# Patient Record
Sex: Female | Born: 1975
Health system: Southern US, Community
[De-identification: ages and names within clinical notes are randomized; demographics above are authoritative.]

## PROBLEM LIST (undated history)

## (undated) DIAGNOSIS — J189 Pneumonia, unspecified organism: Secondary | ICD-10-CM

## (undated) DIAGNOSIS — K219 Gastro-esophageal reflux disease without esophagitis: Secondary | ICD-10-CM

## (undated) DIAGNOSIS — M1990 Unspecified osteoarthritis, unspecified site: Secondary | ICD-10-CM

## (undated) DIAGNOSIS — Z8739 Personal history of other diseases of the musculoskeletal system and connective tissue: Secondary | ICD-10-CM

## (undated) DIAGNOSIS — R55 Syncope and collapse: Secondary | ICD-10-CM

## (undated) DIAGNOSIS — Z91410 Personal history of adult physical and sexual abuse: Secondary | ICD-10-CM

## (undated) DIAGNOSIS — R112 Nausea with vomiting, unspecified: Secondary | ICD-10-CM

## (undated) DIAGNOSIS — R200 Anesthesia of skin: Secondary | ICD-10-CM

## (undated) DIAGNOSIS — G43909 Migraine, unspecified, not intractable, without status migrainosus: Secondary | ICD-10-CM

## (undated) DIAGNOSIS — E119 Type 2 diabetes mellitus without complications: Secondary | ICD-10-CM

## (undated) DIAGNOSIS — N946 Dysmenorrhea, unspecified: Secondary | ICD-10-CM

## (undated) DIAGNOSIS — Z9889 Other specified postprocedural states: Secondary | ICD-10-CM

## (undated) HISTORY — DX: Syncope and collapse: R55

## (undated) HISTORY — PX: WISDOM TOOTH EXTRACTION: SHX21

## (undated) HISTORY — PX: KNEE SURGERY: SHX244

---

## 1997-10-04 ENCOUNTER — Encounter: Admission: RE | Admit: 1997-10-04 | Discharge: 1997-10-04 | Payer: Self-pay | Admitting: Family Medicine

## 1997-12-20 ENCOUNTER — Encounter: Admission: RE | Admit: 1997-12-20 | Discharge: 1997-12-20 | Payer: Self-pay | Admitting: Family Medicine

## 1997-12-23 ENCOUNTER — Encounter: Admission: RE | Admit: 1997-12-23 | Discharge: 1997-12-23 | Payer: Self-pay | Admitting: Family Medicine

## 1997-12-27 ENCOUNTER — Encounter: Admission: RE | Admit: 1997-12-27 | Discharge: 1997-12-27 | Payer: Self-pay | Admitting: Family Medicine

## 1998-01-13 ENCOUNTER — Encounter: Admission: RE | Admit: 1998-01-13 | Discharge: 1998-01-13 | Payer: Self-pay | Admitting: Family Medicine

## 1998-01-30 ENCOUNTER — Encounter: Admission: RE | Admit: 1998-01-30 | Discharge: 1998-01-30 | Payer: Self-pay | Admitting: Sports Medicine

## 1998-05-27 ENCOUNTER — Encounter: Admission: RE | Admit: 1998-05-27 | Discharge: 1998-05-27 | Payer: Self-pay | Admitting: Family Medicine

## 1998-10-31 ENCOUNTER — Emergency Department (HOSPITAL_COMMUNITY): Admission: EM | Admit: 1998-10-31 | Discharge: 1998-10-31 | Payer: Self-pay | Admitting: Emergency Medicine

## 1998-11-01 ENCOUNTER — Encounter: Payer: Self-pay | Admitting: Emergency Medicine

## 1998-11-06 ENCOUNTER — Encounter: Admission: RE | Admit: 1998-11-06 | Discharge: 1998-11-06 | Payer: Self-pay | Admitting: Family Medicine

## 1998-11-20 ENCOUNTER — Encounter: Admission: RE | Admit: 1998-11-20 | Discharge: 1998-11-20 | Payer: Self-pay | Admitting: Sports Medicine

## 1999-01-01 ENCOUNTER — Encounter: Admission: RE | Admit: 1999-01-01 | Discharge: 1999-01-01 | Payer: Self-pay | Admitting: Family Medicine

## 2000-02-25 ENCOUNTER — Encounter: Admission: RE | Admit: 2000-02-25 | Discharge: 2000-02-25 | Payer: Self-pay | Admitting: Family Medicine

## 2000-04-06 ENCOUNTER — Encounter: Admission: RE | Admit: 2000-04-06 | Discharge: 2000-04-06 | Payer: Self-pay | Admitting: Family Medicine

## 2000-05-05 ENCOUNTER — Encounter: Admission: RE | Admit: 2000-05-05 | Discharge: 2000-05-05 | Payer: Self-pay | Admitting: Family Medicine

## 2000-07-05 ENCOUNTER — Encounter: Admission: RE | Admit: 2000-07-05 | Discharge: 2000-07-05 | Payer: Self-pay | Admitting: Family Medicine

## 2000-07-05 ENCOUNTER — Other Ambulatory Visit: Admission: RE | Admit: 2000-07-05 | Discharge: 2000-07-05 | Payer: Self-pay | Admitting: Family Medicine

## 2000-07-26 ENCOUNTER — Encounter: Admission: RE | Admit: 2000-07-26 | Discharge: 2000-07-26 | Payer: Self-pay | Admitting: Family Medicine

## 2000-12-29 ENCOUNTER — Encounter: Admission: RE | Admit: 2000-12-29 | Discharge: 2000-12-29 | Payer: Self-pay | Admitting: Family Medicine

## 2001-03-30 ENCOUNTER — Encounter: Admission: RE | Admit: 2001-03-30 | Discharge: 2001-03-30 | Payer: Self-pay | Admitting: Sports Medicine

## 2001-12-15 ENCOUNTER — Encounter: Admission: RE | Admit: 2001-12-15 | Discharge: 2001-12-15 | Payer: Self-pay | Admitting: Family Medicine

## 2001-12-21 ENCOUNTER — Encounter: Admission: RE | Admit: 2001-12-21 | Discharge: 2001-12-21 | Payer: Self-pay | Admitting: Sports Medicine

## 2002-01-17 ENCOUNTER — Encounter: Admission: RE | Admit: 2002-01-17 | Discharge: 2002-01-17 | Payer: Self-pay | Admitting: Family Medicine

## 2002-01-25 ENCOUNTER — Encounter: Admission: RE | Admit: 2002-01-25 | Discharge: 2002-01-25 | Payer: Self-pay | Admitting: Sports Medicine

## 2002-04-19 ENCOUNTER — Encounter: Admission: RE | Admit: 2002-04-19 | Discharge: 2002-04-19 | Payer: Self-pay | Admitting: Sports Medicine

## 2002-04-19 ENCOUNTER — Encounter: Payer: Self-pay | Admitting: Sports Medicine

## 2002-05-29 ENCOUNTER — Encounter: Admission: RE | Admit: 2002-05-29 | Discharge: 2002-05-29 | Payer: Self-pay | Admitting: Sports Medicine

## 2003-03-07 ENCOUNTER — Encounter (INDEPENDENT_AMBULATORY_CARE_PROVIDER_SITE_OTHER): Payer: Self-pay | Admitting: *Deleted

## 2003-03-07 ENCOUNTER — Encounter: Admission: RE | Admit: 2003-03-07 | Discharge: 2003-03-07 | Payer: Self-pay | Admitting: Sports Medicine

## 2003-04-17 ENCOUNTER — Encounter: Admission: RE | Admit: 2003-04-17 | Discharge: 2003-04-17 | Payer: Self-pay | Admitting: Family Medicine

## 2003-08-15 ENCOUNTER — Encounter: Admission: RE | Admit: 2003-08-15 | Discharge: 2003-08-15 | Payer: Self-pay | Admitting: Family Medicine

## 2003-11-28 ENCOUNTER — Ambulatory Visit: Payer: Self-pay | Admitting: Sports Medicine

## 2003-12-16 ENCOUNTER — Ambulatory Visit: Payer: Self-pay | Admitting: Family Medicine

## 2004-01-14 ENCOUNTER — Ambulatory Visit: Payer: Self-pay | Admitting: Family Medicine

## 2004-01-24 ENCOUNTER — Ambulatory Visit: Payer: Self-pay | Admitting: Family Medicine

## 2004-03-10 ENCOUNTER — Ambulatory Visit: Payer: Self-pay | Admitting: Family Medicine

## 2004-04-16 ENCOUNTER — Encounter: Payer: Self-pay | Admitting: Sports Medicine

## 2004-04-16 ENCOUNTER — Ambulatory Visit: Payer: Self-pay | Admitting: Sports Medicine

## 2004-08-20 ENCOUNTER — Ambulatory Visit: Payer: Self-pay | Admitting: Sports Medicine

## 2004-11-12 ENCOUNTER — Ambulatory Visit: Payer: Self-pay | Admitting: Sports Medicine

## 2004-12-22 ENCOUNTER — Ambulatory Visit: Payer: Self-pay | Admitting: Family Medicine

## 2005-01-27 ENCOUNTER — Ambulatory Visit: Payer: Self-pay | Admitting: Family Medicine

## 2005-02-19 ENCOUNTER — Ambulatory Visit: Payer: Self-pay | Admitting: Family Medicine

## 2005-05-06 ENCOUNTER — Ambulatory Visit: Payer: Self-pay | Admitting: Sports Medicine

## 2005-07-01 ENCOUNTER — Ambulatory Visit: Payer: Self-pay | Admitting: Sports Medicine

## 2005-10-26 ENCOUNTER — Ambulatory Visit: Payer: Self-pay | Admitting: Family Medicine

## 2006-04-07 ENCOUNTER — Ambulatory Visit: Payer: Self-pay | Admitting: Family Medicine

## 2006-04-14 DIAGNOSIS — R55 Syncope and collapse: Secondary | ICD-10-CM | POA: Insufficient documentation

## 2006-04-14 DIAGNOSIS — J309 Allergic rhinitis, unspecified: Secondary | ICD-10-CM | POA: Insufficient documentation

## 2006-04-14 DIAGNOSIS — E669 Obesity, unspecified: Secondary | ICD-10-CM | POA: Insufficient documentation

## 2006-04-14 DIAGNOSIS — G43909 Migraine, unspecified, not intractable, without status migrainosus: Secondary | ICD-10-CM | POA: Insufficient documentation

## 2006-06-02 ENCOUNTER — Encounter: Payer: Self-pay | Admitting: Sports Medicine

## 2006-06-02 ENCOUNTER — Ambulatory Visit: Payer: Self-pay | Admitting: Sports Medicine

## 2006-06-02 DIAGNOSIS — M25569 Pain in unspecified knee: Secondary | ICD-10-CM | POA: Insufficient documentation

## 2006-06-02 LAB — CONVERTED CEMR LAB
BUN: 8 mg/dL (ref 6–23)
CO2: 24 meq/L (ref 19–32)
Cholesterol: 157 mg/dL (ref 0–200)
Creatinine, Ser: 0.81 mg/dL (ref 0.40–1.20)
Glucose, Bld: 111 mg/dL — ABNORMAL HIGH (ref 70–99)
Sodium: 138 meq/L (ref 135–145)
Total Bilirubin: 1.1 mg/dL (ref 0.3–1.2)
Total Protein: 6.7 g/dL (ref 6.0–8.3)
Triglycerides: 323 mg/dL — ABNORMAL HIGH (ref <150)
VLDL: 65 mg/dL — ABNORMAL HIGH (ref 0–40)

## 2006-06-03 ENCOUNTER — Telehealth: Payer: Self-pay | Admitting: *Deleted

## 2006-07-05 ENCOUNTER — Ambulatory Visit: Payer: Self-pay | Admitting: Family Medicine

## 2006-07-05 DIAGNOSIS — E781 Pure hyperglyceridemia: Secondary | ICD-10-CM | POA: Insufficient documentation

## 2006-07-05 DIAGNOSIS — R7989 Other specified abnormal findings of blood chemistry: Secondary | ICD-10-CM | POA: Insufficient documentation

## 2006-07-14 ENCOUNTER — Ambulatory Visit: Payer: Self-pay | Admitting: Family Medicine

## 2006-07-14 ENCOUNTER — Telehealth: Payer: Self-pay | Admitting: *Deleted

## 2006-07-14 DIAGNOSIS — F19939 Other psychoactive substance use, unspecified with withdrawal, unspecified: Secondary | ICD-10-CM | POA: Insufficient documentation

## 2006-07-14 DIAGNOSIS — F19239 Other psychoactive substance dependence with withdrawal, unspecified: Secondary | ICD-10-CM | POA: Insufficient documentation

## 2006-08-11 ENCOUNTER — Ambulatory Visit: Payer: Self-pay | Admitting: Sports Medicine

## 2006-08-11 ENCOUNTER — Telehealth: Payer: Self-pay | Admitting: *Deleted

## 2006-08-11 DIAGNOSIS — J019 Acute sinusitis, unspecified: Secondary | ICD-10-CM | POA: Insufficient documentation

## 2006-09-15 ENCOUNTER — Ambulatory Visit: Payer: Self-pay | Admitting: Sports Medicine

## 2006-09-15 DIAGNOSIS — S335XXA Sprain of ligaments of lumbar spine, initial encounter: Secondary | ICD-10-CM | POA: Insufficient documentation

## 2006-11-02 ENCOUNTER — Telehealth: Payer: Self-pay | Admitting: Sports Medicine

## 2006-11-18 ENCOUNTER — Emergency Department (HOSPITAL_COMMUNITY): Admission: EM | Admit: 2006-11-18 | Discharge: 2006-11-18 | Payer: Self-pay | Admitting: Emergency Medicine

## 2006-12-01 ENCOUNTER — Ambulatory Visit: Payer: Self-pay | Admitting: Sports Medicine

## 2006-12-01 DIAGNOSIS — E8881 Metabolic syndrome: Secondary | ICD-10-CM | POA: Insufficient documentation

## 2006-12-02 ENCOUNTER — Ambulatory Visit: Payer: Self-pay | Admitting: Internal Medicine

## 2006-12-02 ENCOUNTER — Encounter: Payer: Self-pay | Admitting: Sports Medicine

## 2006-12-06 ENCOUNTER — Telehealth: Payer: Self-pay | Admitting: *Deleted

## 2006-12-08 LAB — CONVERTED CEMR LAB: Glucose, 2 hour: 243 mg/dL — ABNORMAL HIGH (ref 70–139)

## 2006-12-09 ENCOUNTER — Ambulatory Visit: Payer: Self-pay | Admitting: Family Medicine

## 2007-01-19 ENCOUNTER — Ambulatory Visit: Payer: Self-pay | Admitting: Sports Medicine

## 2007-01-19 DIAGNOSIS — E119 Type 2 diabetes mellitus without complications: Secondary | ICD-10-CM | POA: Insufficient documentation

## 2007-01-20 ENCOUNTER — Encounter: Admission: RE | Admit: 2007-01-20 | Discharge: 2007-01-20 | Payer: Self-pay | Admitting: Sports Medicine

## 2007-01-30 ENCOUNTER — Encounter: Payer: Self-pay | Admitting: Sports Medicine

## 2007-02-02 ENCOUNTER — Telehealth: Payer: Self-pay | Admitting: *Deleted

## 2007-03-10 ENCOUNTER — Encounter: Payer: Self-pay | Admitting: Sports Medicine

## 2007-04-03 ENCOUNTER — Telehealth: Payer: Self-pay | Admitting: *Deleted

## 2007-05-04 ENCOUNTER — Ambulatory Visit: Payer: Self-pay | Admitting: Sports Medicine

## 2007-05-04 DIAGNOSIS — M7750 Other enthesopathy of unspecified foot: Secondary | ICD-10-CM | POA: Insufficient documentation

## 2007-05-04 DIAGNOSIS — M25519 Pain in unspecified shoulder: Secondary | ICD-10-CM | POA: Insufficient documentation

## 2007-06-13 ENCOUNTER — Telehealth (INDEPENDENT_AMBULATORY_CARE_PROVIDER_SITE_OTHER): Payer: Self-pay | Admitting: *Deleted

## 2007-06-13 ENCOUNTER — Ambulatory Visit: Payer: Self-pay | Admitting: Family Medicine

## 2007-06-13 DIAGNOSIS — J069 Acute upper respiratory infection, unspecified: Secondary | ICD-10-CM | POA: Insufficient documentation

## 2007-07-27 ENCOUNTER — Ambulatory Visit: Payer: Self-pay | Admitting: Sports Medicine

## 2007-08-01 ENCOUNTER — Encounter: Payer: Self-pay | Admitting: *Deleted

## 2007-09-29 ENCOUNTER — Emergency Department (HOSPITAL_COMMUNITY): Admission: EM | Admit: 2007-09-29 | Discharge: 2007-09-29 | Payer: Self-pay | Admitting: Emergency Medicine

## 2007-10-19 ENCOUNTER — Ambulatory Visit: Payer: Self-pay | Admitting: Sports Medicine

## 2007-10-19 DIAGNOSIS — L919 Hypertrophic disorder of the skin, unspecified: Secondary | ICD-10-CM

## 2007-10-19 DIAGNOSIS — L909 Atrophic disorder of skin, unspecified: Secondary | ICD-10-CM | POA: Insufficient documentation

## 2008-01-08 ENCOUNTER — Emergency Department (HOSPITAL_COMMUNITY): Admission: EM | Admit: 2008-01-08 | Discharge: 2008-01-08 | Payer: Self-pay | Admitting: Emergency Medicine

## 2008-04-02 ENCOUNTER — Ambulatory Visit: Payer: Self-pay | Admitting: Family Medicine

## 2008-04-02 DIAGNOSIS — I951 Orthostatic hypotension: Secondary | ICD-10-CM | POA: Insufficient documentation

## 2008-04-02 LAB — CONVERTED CEMR LAB: Hgb A1c MFr Bld: 5.3 %

## 2008-04-04 ENCOUNTER — Encounter: Payer: Self-pay | Admitting: Family Medicine

## 2008-04-04 ENCOUNTER — Ambulatory Visit: Payer: Self-pay | Admitting: Family Medicine

## 2008-04-04 LAB — CONVERTED CEMR LAB
ALT: 17 units/L (ref 0–35)
AST: 18 units/L (ref 0–37)
Calcium: 9.2 mg/dL (ref 8.4–10.5)
Chloride: 105 meq/L (ref 96–112)
Creatinine, Ser: 0.83 mg/dL (ref 0.40–1.20)
LDL Cholesterol: 45 mg/dL (ref 0–99)
Total Bilirubin: 0.6 mg/dL (ref 0.3–1.2)
Total CHOL/HDL Ratio: 3.2
VLDL: 49 mg/dL — ABNORMAL HIGH (ref 0–40)

## 2008-04-05 ENCOUNTER — Encounter: Payer: Self-pay | Admitting: Family Medicine

## 2008-04-16 ENCOUNTER — Encounter: Payer: Self-pay | Admitting: Family Medicine

## 2008-05-01 ENCOUNTER — Telehealth: Payer: Self-pay | Admitting: Family Medicine

## 2008-06-21 ENCOUNTER — Telehealth: Payer: Self-pay | Admitting: Family Medicine

## 2008-06-21 ENCOUNTER — Ambulatory Visit: Payer: Self-pay | Admitting: Family Medicine

## 2008-06-21 ENCOUNTER — Encounter (INDEPENDENT_AMBULATORY_CARE_PROVIDER_SITE_OTHER): Payer: Self-pay | Admitting: Family Medicine

## 2008-06-21 DIAGNOSIS — R079 Chest pain, unspecified: Secondary | ICD-10-CM | POA: Insufficient documentation

## 2008-06-21 DIAGNOSIS — R51 Headache: Secondary | ICD-10-CM | POA: Insufficient documentation

## 2008-06-21 DIAGNOSIS — R519 Headache, unspecified: Secondary | ICD-10-CM | POA: Insufficient documentation

## 2008-06-21 DIAGNOSIS — R1115 Cyclical vomiting syndrome unrelated to migraine: Secondary | ICD-10-CM | POA: Insufficient documentation

## 2008-06-21 LAB — CONVERTED CEMR LAB: Beta hcg, urine, semiquantitative: NEGATIVE

## 2008-06-24 ENCOUNTER — Encounter: Payer: Self-pay | Admitting: *Deleted

## 2008-06-24 LAB — CONVERTED CEMR LAB
ALT: 48 units/L — ABNORMAL HIGH (ref 0–35)
CO2: 24 meq/L (ref 19–32)
Calcium: 9.6 mg/dL (ref 8.4–10.5)
Chloride: 103 meq/L (ref 96–112)
Platelets: 236 10*3/uL (ref 150–400)
Potassium: 4.4 meq/L (ref 3.5–5.3)
Sodium: 139 meq/L (ref 135–145)
Total Protein: 7.2 g/dL (ref 6.0–8.3)
WBC: 5.7 10*3/uL (ref 4.0–10.5)

## 2008-06-26 ENCOUNTER — Ambulatory Visit: Payer: Self-pay | Admitting: Family Medicine

## 2008-06-26 ENCOUNTER — Encounter (INDEPENDENT_AMBULATORY_CARE_PROVIDER_SITE_OTHER): Payer: Self-pay | Admitting: Family Medicine

## 2008-06-26 ENCOUNTER — Encounter: Payer: Self-pay | Admitting: Family Medicine

## 2008-06-26 DIAGNOSIS — R740 Nonspecific elevation of levels of transaminase and lactic acid dehydrogenase [LDH]: Secondary | ICD-10-CM

## 2008-06-26 DIAGNOSIS — R7402 Elevation of levels of lactic acid dehydrogenase (LDH): Secondary | ICD-10-CM

## 2008-06-26 DIAGNOSIS — R799 Abnormal finding of blood chemistry, unspecified: Secondary | ICD-10-CM | POA: Insufficient documentation

## 2008-06-26 DIAGNOSIS — R7401 Elevation of levels of liver transaminase levels: Secondary | ICD-10-CM | POA: Insufficient documentation

## 2008-06-26 HISTORY — DX: Elevation of levels of liver transaminase levels: R74.01

## 2008-06-26 HISTORY — DX: Elevation of levels of lactic acid dehydrogenase (LDH): R74.02

## 2008-06-27 ENCOUNTER — Encounter: Admission: RE | Admit: 2008-06-27 | Discharge: 2008-06-27 | Payer: Self-pay | Admitting: Family Medicine

## 2008-06-27 LAB — CONVERTED CEMR LAB
ALT: 23 units/L (ref 0–35)
AST: 24 units/L (ref 0–37)
CO2: 24 meq/L (ref 19–32)
Chloride: 106 meq/L (ref 96–112)
MCV: 89.3 fL (ref 78.0–100.0)
Platelets: 223 10*3/uL (ref 150–400)
RDW: 12.7 % (ref 11.5–15.5)
Sodium: 139 meq/L (ref 135–145)
Total Bilirubin: 0.9 mg/dL (ref 0.3–1.2)
Total Protein: 6.7 g/dL (ref 6.0–8.3)
WBC: 5.7 10*3/uL (ref 4.0–10.5)

## 2008-08-13 ENCOUNTER — Encounter: Payer: Self-pay | Admitting: Family Medicine

## 2008-08-13 LAB — CONVERTED CEMR LAB: Pap Smear: NORMAL

## 2008-09-03 ENCOUNTER — Ambulatory Visit: Payer: Self-pay | Admitting: Family Medicine

## 2008-09-03 DIAGNOSIS — F329 Major depressive disorder, single episode, unspecified: Secondary | ICD-10-CM | POA: Insufficient documentation

## 2008-09-03 DIAGNOSIS — F339 Major depressive disorder, recurrent, unspecified: Secondary | ICD-10-CM | POA: Insufficient documentation

## 2008-09-03 DIAGNOSIS — R29818 Other symptoms and signs involving the nervous system: Secondary | ICD-10-CM | POA: Insufficient documentation

## 2008-09-04 ENCOUNTER — Ambulatory Visit (HOSPITAL_BASED_OUTPATIENT_CLINIC_OR_DEPARTMENT_OTHER): Admission: RE | Admit: 2008-09-04 | Discharge: 2008-09-04 | Payer: Self-pay | Admitting: Family Medicine

## 2008-09-04 ENCOUNTER — Encounter: Payer: Self-pay | Admitting: Family Medicine

## 2008-10-04 ENCOUNTER — Telehealth: Payer: Self-pay | Admitting: Family Medicine

## 2008-10-10 ENCOUNTER — Telehealth: Payer: Self-pay | Admitting: *Deleted

## 2008-10-24 ENCOUNTER — Ambulatory Visit (HOSPITAL_BASED_OUTPATIENT_CLINIC_OR_DEPARTMENT_OTHER): Admission: RE | Admit: 2008-10-24 | Discharge: 2008-10-24 | Payer: Self-pay | Admitting: Family Medicine

## 2008-10-24 ENCOUNTER — Encounter: Payer: Self-pay | Admitting: Family Medicine

## 2008-10-25 ENCOUNTER — Ambulatory Visit: Payer: Self-pay | Admitting: Internal Medicine

## 2008-11-14 ENCOUNTER — Telehealth: Payer: Self-pay | Admitting: *Deleted

## 2008-11-14 DIAGNOSIS — G4733 Obstructive sleep apnea (adult) (pediatric): Secondary | ICD-10-CM | POA: Insufficient documentation

## 2008-12-06 ENCOUNTER — Ambulatory Visit: Payer: Self-pay | Admitting: Family Medicine

## 2008-12-11 ENCOUNTER — Telehealth (INDEPENDENT_AMBULATORY_CARE_PROVIDER_SITE_OTHER): Payer: Self-pay | Admitting: *Deleted

## 2008-12-13 ENCOUNTER — Encounter: Payer: Self-pay | Admitting: Family Medicine

## 2008-12-13 ENCOUNTER — Ambulatory Visit: Payer: Self-pay | Admitting: Family Medicine

## 2008-12-13 LAB — CONVERTED CEMR LAB
BUN: 8 mg/dL (ref 6–23)
CO2: 24 meq/L (ref 19–32)
Cholesterol: 148 mg/dL (ref 0–200)
Creatinine, Ser: 0.85 mg/dL (ref 0.40–1.20)
Glucose, Bld: 100 mg/dL — ABNORMAL HIGH (ref 70–99)
Total Bilirubin: 1.1 mg/dL (ref 0.3–1.2)
Total CHOL/HDL Ratio: 3.9
Triglycerides: 171 mg/dL — ABNORMAL HIGH (ref <150)
VLDL: 34 mg/dL (ref 0–40)

## 2008-12-16 ENCOUNTER — Encounter: Payer: Self-pay | Admitting: Family Medicine

## 2008-12-20 ENCOUNTER — Encounter: Payer: Self-pay | Admitting: Family Medicine

## 2009-02-04 ENCOUNTER — Ambulatory Visit: Payer: Self-pay | Admitting: Family Medicine

## 2009-02-04 ENCOUNTER — Telehealth: Payer: Self-pay | Admitting: Family Medicine

## 2009-02-04 ENCOUNTER — Encounter: Payer: Self-pay | Admitting: Family Medicine

## 2009-02-21 ENCOUNTER — Ambulatory Visit: Payer: Self-pay | Admitting: Family Medicine

## 2009-02-21 DIAGNOSIS — M5412 Radiculopathy, cervical region: Secondary | ICD-10-CM | POA: Insufficient documentation

## 2009-02-21 DIAGNOSIS — R209 Unspecified disturbances of skin sensation: Secondary | ICD-10-CM | POA: Insufficient documentation

## 2009-02-24 ENCOUNTER — Encounter: Payer: Self-pay | Admitting: *Deleted

## 2009-02-27 ENCOUNTER — Ambulatory Visit: Payer: Self-pay | Admitting: Sports Medicine

## 2009-02-27 DIAGNOSIS — M67919 Unspecified disorder of synovium and tendon, unspecified shoulder: Secondary | ICD-10-CM | POA: Insufficient documentation

## 2009-02-27 DIAGNOSIS — M719 Bursopathy, unspecified: Secondary | ICD-10-CM

## 2009-03-19 ENCOUNTER — Ambulatory Visit: Payer: Self-pay | Admitting: Sports Medicine

## 2009-03-20 ENCOUNTER — Ambulatory Visit: Payer: Self-pay | Admitting: Sports Medicine

## 2009-04-11 ENCOUNTER — Ambulatory Visit: Payer: Self-pay | Admitting: Family Medicine

## 2009-04-15 ENCOUNTER — Ambulatory Visit: Payer: Self-pay | Admitting: Sports Medicine

## 2009-06-09 ENCOUNTER — Ambulatory Visit: Payer: Self-pay | Admitting: Sports Medicine

## 2009-09-03 ENCOUNTER — Encounter: Payer: Self-pay | Admitting: Family Medicine

## 2009-09-03 ENCOUNTER — Telehealth: Payer: Self-pay | Admitting: Family Medicine

## 2009-09-03 ENCOUNTER — Ambulatory Visit: Payer: Self-pay | Admitting: Family Medicine

## 2009-09-03 DIAGNOSIS — K5289 Other specified noninfective gastroenteritis and colitis: Secondary | ICD-10-CM | POA: Insufficient documentation

## 2009-09-08 ENCOUNTER — Ambulatory Visit: Payer: Self-pay | Admitting: Family Medicine

## 2009-09-08 ENCOUNTER — Encounter (INDEPENDENT_AMBULATORY_CARE_PROVIDER_SITE_OTHER): Payer: Self-pay | Admitting: *Deleted

## 2009-09-08 ENCOUNTER — Encounter: Payer: Self-pay | Admitting: Family Medicine

## 2009-09-09 ENCOUNTER — Telehealth: Payer: Self-pay | Admitting: Family Medicine

## 2009-09-09 LAB — CONVERTED CEMR LAB
BUN: 4 mg/dL — ABNORMAL LOW (ref 6–23)
Creatinine, Ser: 1.07 mg/dL (ref 0.40–1.20)

## 2009-09-26 ENCOUNTER — Ambulatory Visit: Payer: Self-pay | Admitting: Sports Medicine

## 2009-09-26 DIAGNOSIS — B372 Candidiasis of skin and nail: Secondary | ICD-10-CM | POA: Insufficient documentation

## 2009-10-17 ENCOUNTER — Ambulatory Visit: Payer: Self-pay | Admitting: Family Medicine

## 2009-10-17 DIAGNOSIS — N951 Menopausal and female climacteric states: Secondary | ICD-10-CM | POA: Insufficient documentation

## 2009-10-17 LAB — CONVERTED CEMR LAB
Hgb A1c MFr Bld: 5.5 %
MCV: 92.8 fL (ref 78.0–100.0)
Platelets: 240 10*3/uL (ref 150–400)
RDW: 13.2 % (ref 11.5–15.5)
TSH: 1.441 microintl units/mL (ref 0.350–4.500)
WBC: 6.6 10*3/uL (ref 4.0–10.5)

## 2009-10-22 ENCOUNTER — Encounter: Payer: Self-pay | Admitting: Family Medicine

## 2009-11-05 ENCOUNTER — Encounter: Payer: Self-pay | Admitting: *Deleted

## 2009-11-10 ENCOUNTER — Encounter: Payer: Self-pay | Admitting: Family Medicine

## 2010-03-08 ENCOUNTER — Encounter: Payer: Self-pay | Admitting: Family Medicine

## 2010-03-19 NOTE — Letter (Signed)
Summary: Out of Work  [facility]  [address]   [place], [place] [address]   Phone: [phone]  Fax: [phone]    September 03, 2009   Employee:  Orinda Kenner Jerry    To Whom It May Concern:   For Medical reasons, please excuse the above named employee from work for the following dates:  Start:   09/03/09  Return to work:   09/05/09  If you need additional information, please feel free to contact our office.         Sincerely,    Bobby Rumpf  MD
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
## 2010-03-19 NOTE — Letter (Signed)
Summary: [facility]-CPAP Bi-Level Set up  [facility]-CPAP Bi-Level Set up   Imported By: Clydell Hakim 03/31/2009 11:40:49  _____________________________________________________________________  External Attachment:    Type:   Image     Comment:   External Document

## 2010-03-19 NOTE — Letter (Signed)
Summary: Out of Work  [facility]  [address]   [place], [place] [address]   Phone: [phone]  Fax: [phone]    February 21, 2009   Employee:  Orinda Kenner Rybolt    To Whom It May Concern:   Anne Daniels is my patient, and she was seen in my office today for a medical reason.  Because of her current medical condition, I have advised Anne Daniels that she should not engage in any activities that require lifting or pulling with her left arm/hand, and she is advised to use the sling and swath while at work until she is seen by Sports Medicine.       Sincerely,    Paula Compton MD

## 2010-03-19 NOTE — Letter (Signed)
Summary: Out of Work  [facility]  [address]   [place], [place] [address]   Phone: [phone]  Fax: [phone]    March 19, 2009   Employee:  Anne Daniels    To Whom It May Concern:   For Medical reasons, please excuse the above named employee from work for the following dates:  Start:   Feb 2 AM  End:   Feb 2 PM  I will need to see for a procedure at 1:30PM Feb 3 and she will be out that afternoon.  No lifting overhead > 90 degrees.  No lifting greater than 10 pounds.  If you need additional information, please feel free to contact our office.         Sincerely,    Enid Baas MD
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
## 2010-03-19 NOTE — Miscellaneous (Signed)
Summary: prior auth form faxed again to [facility]  Clinical Lists Changes will keep copy of this in triage.Golden Circle RN  November 05, 2009 2:55 PM  Appended Document: prior auth form faxed again to [facility] it was denied.pt notified
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
## 2010-03-19 NOTE — Letter (Signed)
Summary: Out of Work  [facility]  [address]   [place], [place] [address]   Phone: [phone]  Fax: [phone]    November 10, 2009  Prescription Claim Appeals [employer] [facility] [address] [facility] [address] [address] [place], [place] [address]   Patient:  Anne Daniels DOB: 28-Oct-1975 Plan Member ID: [id number]  To Whom It May Concern:   I write this letter on behalf of my patient, Anne Daniels.  Anne Daniels has suffered from migraine headaches for many years.  She has tried several medications for resolution of acute migraine exacerbations, and she has found successful Imitrex (sumatriptan) tablets to be the most successful abortive therapy for her migraine headaches.  In completing the [employer] [employer] Prior-Authorization questionnaire, I indicated Anne Daniels's responses regarding frequency of acute migraine flares to be approximately once every 4 or 5 months since the implementation of suppressive therapy.   The questionnaire states that no prior-authorization is required for patients whose migraine flareups occur less frequently than four times a month.  Therefore, I was quite surprised to receive your denial of coverage for Imitrex based upon infrequent nature of her migraine headaches.      I respectfully request that coverage for Anne Daniels's Imitrex tablets be reinstated, based on the reasons stated above.    Sincerely,    Paula Compton MD  Cc: Orinda Kenner. Autumn Messing  Appended Document: Out of Work mailed

## 2010-03-19 NOTE — Miscellaneous (Signed)
Summary: prior auth sumatriptan  Clinical Lists Changes prior auth form to pcp. plz return when the questions & signature are done.Golden Circle RN  October 22, 2009 11:15 AM  Problems: Added new problem of MIGRAINE HEADACHE (ICD-346.90)    Prior authorization completed, based upon information from last visit (prophylaxis has reduced to one HA every 5 months; no concern for overuse of triptan therapy). Paula Compton MD  October 23, 2009 2:31 PM  Appended Document: prior auth sumatriptan faxed to [employer]  Appended Document: prior auth sumatriptan Prior auth returned . not approved. letter from ins co to pcp
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
## 2010-03-19 NOTE — Assessment & Plan Note (Signed)
Summary: f/u meds ,df   Vital Signs:  Patient profile:   35 year old female Weight:      254 pounds BMI:     40.53 BSA:     2.23 Temp:     98.6 degrees F oral Pulse rate:   86 / minute BP sitting:   130 / 90  (right arm)  Vitals Entered By: Tomasita Morrow RN (October 17, 2009 3:53 PM) Is Patient Diabetic? Yes Did you bring your meter with you today? No Pain Assessment Patient in pain? yes     Location: chest Nutritional Status BMI of > 30 = obese  Does patient need assistance? Functional Status Self care Ambulation Normal   Primary Care Provider:  Paula Compton MD   History of Present Illness: Anne Daniels comes in today for followup of her medical problems.  Reports improvement in L shoulder pain for whcih she was seen in [facility].  She is here with her husband, Kathlene November.   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  She has worked there for 11 yrs, states that recently the company has written her up for fabricated claims, and that the intention is to fire her before she accrues rights to pension at her 11-yr anniversary with the store on Oct 29th.  This would mean losing her benefits and insurance.  Her husband Kathlene November has already lost his insurance, is presentlywithout insurance and cannot afford the insurance offered by his workplace.  Nichola reports episode of stabbing chest pain that began about 2 weeks ago, usually occur after she comes home from work and is stressed.  Feels onset just behind sternum, feels sharp and stabbing, not pressure=like and not associated with nausea. Perhaps diaphoresis.  No dyspnea.  Is aware of being stressed out when it happens.  Resolves within 15 minutes.  Not related to physical exertion.   Regarding her DM, continues on metformin.  Has had excellent glucose control as evidenced by A1C values (See today's A1C).   Reports feeling hot flashes frequently.  Had been seeing gyn Dr. Tracey Harries, negative PAP smear 1 yr ago. Would like to return to her OCP, but been unable to get it refilled.  Last intercourse over 3 months ago, due to her menses and husband's depression.  Says women in her family are known to have menopause start in late 71s or early 30s.  Migraine headaches have really tapered off since starting the betablocker.  Used to get 3-4 per week, now down to one every 4 or 5 months.  Easily stopped with Imitrex when HA begins.   Current Medications (verified): 1)  Paxil Cr 25 Mg Tb24 (Paroxetine Hcl) .... Take 1 Tablet By Mouth Once A Day 2)  Propranolol Hcl 20 Mg Tabs (Propranolol Hcl) .Marland Kitchen.. 1 Po Bid 3)  Glucophage 500 Mg  Tabs (Metformin Hcl) .... Take One Tablet Twice Daily 4)  Imitrex 25 Mg  Tabs (Sumatriptan Succinate) .Marland Kitchen.. 1 By Mouth At Onset of Headache and Repeat in 2 Hours If Needed 5)  Nortrel 1/35 (21) 1-35 Mg-Mcg Tabs (Norethindrone-Eth Estradiol) 6)  Promethazine Hcl 25 Mg Tabs (Promethazine Hcl) .... 1/2 To 1 Tab By Mouth Every 6 Hours As Needed For Vomiting 7)  Gabapentin 600 Mg Tabs (Gabapentin) .... Take One Tab Tid 8)  Tgt Athletes Foot 1 % Crea (Terbinafine Hcl) .... 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  Lives with husband; mother has moved out.  Nonsmoker. No alcohol.   Physical Exam  General:  well appearing, no apparent distress.  Mouth:  moist mucus membranes Neck:  neck supple. no adenopathy.  Chest Wall:  Tenderness elicited with palpation over sternum and to Left sternal border.  Lungs:  Normal respiratory effort, chest expands symmetrically. Lungs are clear to auscultation, no crackles or wheezes. Heart:  Normal rate and regular rhythm. S1 and S2 normal without gallop, murmur, click, rub or other extra  sounds. Abdomen:  soft, nontender. nondistended.  Additional Exam:  ECG in office today; NSR 66, Lward axis; no ischemic changes.   Impression & Recommendations:  Problem # 1:  CHEST PAIN UNSPECIFIED (ICD-786.50)  Nonsmoking female in her early 30s, with stabbing pain that does not come on with exertion.  Risk factors include mild DM and first degree relative with CAD (mother in her 83s).  To risk stratify with ETT, which she believes she can perform.  Will [facility] to [facility].  Start on ASA 81mg  daily, which she is not taking at this time.   Orders: [facility]- Est  Level 4 ([address])  Problem # 2:  HOT FLASHES (ICD-627.2)  Family history premature ovarian failure. [facility], as well as TSH.  To obtain PAP smear results from Dr. Ambrose Mantle and continue with routine Gyn care with me in [facility].   Orders: [facility]- Est  Level 4 ([address])  Problem # 3:  DIABETES MELLITUS, MILD (ICD-250.00)  Well controlled glucose.  Continue metformin.  Follow labs.  Her updated medication list for this problem includes:    Glucophage 500 Mg Tabs (Metformin hcl) .Marland Kitchen... Take one tablet twice daily  Orders: [facility]- Est  Level 4 ([address]11)  Complete Medication List: 1)  Paxil Cr 25 Mg Tb24 (Paroxetine hcl) .... Take 1 tablet by mouth once a day 2)  Propranolol Hcl 20 Mg Tabs (Propranolol hcl) .Marland Kitchen.. 1 po bid 3)  Glucophage 500 Mg Tabs (Metformin hcl) .... Take one tablet twice daily 4)  Imitrex 25 Mg Tabs (Sumatriptan succinate) .Marland Kitchen.. 1 by mouth at onset of headache and repeat in 2 hours if needed 5)  Nortrel 1/35 (21) 1-35 Mg-mcg Tabs (Norethindrone-eth estradiol) .... Take 1 tab daily as directed disp 1 pack 6)  Gabapentin 600 Mg Tabs (Gabapentin) .... Take one tab tid 7)  Tramadol Hcl 50 Mg Tabs (Tramadol hcl) .... Sig: take 1 to 2 tabs by mouth four timesdaily  as needed for pain disp quant suff 3 months  Patient Instructions: 1)  It was good to see you today.  2)  I have refilled all your medications today.   3)  While  I do not think your chest pain is likely due to heart disease, I recommend you to have an exercise stress test (ETT) with Dr. Darrick Penna' office in Sports Medicine. 4)  I recommend you begin taking a baby aspirin (81mg ) one time daily for heart disease prevention.  5)  FOLLOWUP IN 2 to 3 MONTHS WITH DR Mauricio Po, INFORMATION FOR [facility]  Laboratory Results   Blood Tests   Date/Time Received: October 17, 2009 4:44 PM  Date/Time Reported: October 17, 2009 5:15 PM   HGBA1C: 5.5%   (Normal Range:  Non-Diabetic - 3-6%   Control Diabetic - 6-8%)  Comments: ...........test performed by...........Marland KitchenTerese Door, CMA

## 2010-03-19 NOTE — Progress Notes (Signed)
Summary: Phone-Lab results  Phone Note Outgoing Call   Call placed by: Milinda Antis MD,  September 09, 2009 8:48 AM Details for Reason: labs Summary of Call: spoke with pt, feeling a little better, given normal lab results. Continue to hold Metformin as directed

## 2010-03-19 NOTE — Consult Note (Signed)
Summary: ROI  ROI   Imported By: De Nurse 10/28/2009 16:44:15  _____________________________________________________________________  External Attachment:    Type:   Image     Comment:   External Document

## 2010-03-19 NOTE — Letter (Signed)
Summary: Out of Work  [facility]  [address]   [place], [place] [address]   Phone: [phone]  Fax: [phone]    September 08, 2009   Employee:  Orinda Kenner Falcon    To Whom It May Concern:   For Medical reasons, please excuse the above named employee from work for the following dates: Mrs. Park is under our care for an acute illness which can be contagious therefore she is restricted from work for the following dates below, if you have any questions please feel free to call.   Start:   September 06, 2009  End:   September 12, 2009   Sincerely,    Milinda Antis MD

## 2010-03-19 NOTE — Assessment & Plan Note (Signed)
Summary: pt having diahrrea,tcb   Vital Signs:  Patient profile:   35 year old female Weight:      243.3 pounds Temp:     97.6 degrees F Pulse rate:   88 / minute BP sitting:   127 / 85  (right arm)  Vitals Entered By: Starleen Blue RN (September 08, 2009 9:49 AM)     CC: diarrhea Is Patient Diabetic? Yes Pain Assessment Patient in pain? no        Primary Care Provider:  Paula Compton MD  CC:  diarrhea.  History of Present Illness:     Pt seen on 7/20 for diarrhea, diagnosed with viral gastroenteritis, started on Immodium and promethazine. Diarrhea persist but improving now with approx 10 episodes a day. Watery stools NB, associated with abdominal cramping at time of defecation. N/V improved, had emesis yesterday NB/NB. Trying to drink fluids- only able to tolerate soup, apple sauce, bannan pudding.  Pt feels weak all over, and has lost  a few pounds ( 3lbs) since illness started approx 1 week ago. +low grade fever all < 100.F , no chest pain,  LMP 09/06/09   Immodium helps some Husband states she has improved drastically since last week, even though she does not see it. She has more color and is eating more  CBG- running 90-low 100's  Habits & Providers  Alcohol-Tobacco-Diet     Tobacco Status: never  Current Medications (verified): 1)  Paxil Cr 25 Mg Tb24 (Paroxetine Hcl) .... Take 1 Tablet By Mouth Once A Day 2)  Propranolol Hcl 20 Mg Tabs (Propranolol Hcl) .Marland Kitchen.. 1 Po Bid 3)  Glucophage 500 Mg  Tabs (Metformin Hcl) .... Take One Tablet Twice Daily 4)  Imitrex 25 Mg  Tabs (Sumatriptan Succinate) .Marland Kitchen.. 1 By Mouth At Onset of Headache and Repeat in 2 Hours If Needed 5)  Nortrel 1/35 (21) 1-35 Mg-Mcg Tabs (Norethindrone-Eth Estradiol) 6)  Promethazine Hcl 25 Mg Tabs (Promethazine Hcl) .... 1/2 To 1 Tab By Mouth Every 6 Hours As Needed For Vomiting 7)  Gabapentin 600 Mg Tabs (Gabapentin) .... Take One Tab Tid  Allergies (verified): 1)  Penicillin  Past History:  Past  Medical History: Last updated: 06/21/2008 DM type 2 HTN  lt foot fracture 2006  nasal polyp left  TG 260 in 2005  neurocardiac syncope - dr Garnette Scheuermann, well controlled x 10 yrs on low dose paxil migraines, well controlled on propranolol x 3 yrs with occ breakthrough imitrex  Review of Systems       Per HPI  Physical Exam  General:  obese, pleasant, fatigued, pale  Vital signs noted  Mouth:  dry tongue, no oral lesions Lungs:  CTAB w/o wheeze or crackles  Heart:  RRR, no murmur Abdomen:  normalactive BS, soft, mild TTP in lower quandrants no rebound, no gaurding, no masses felt  Pulses:  Cap refill 3 sec good perfusion to ext  radial and DP 2+   Impression & Recommendations:  Problem # 1:  GASTROENTERITIS WITHOUT DEHYDRATION (ICD-558.9) Assessment Improved Continue supportive care, no red flags to indicate any imaging, and exam non not very concerning, advised a lot of fluids as pt looked a little dry today, offered IV bolus, pt preferred oral intake. Will d/c Metformin in setting of acute illness. Check BMET  Orders: Basic Met-[facility] ([id number]) [facility]- Est Level  3 ([address])  Problem # 2:  DIABETES MELLITUS, MILD (ICD-250.00) Assessment: Unchanged  reviewed flow-sheet, hold Metformin as below. RTC with PCP Her updated  medication list for this problem includes:    Glucophage 500 Mg Tabs (Metformin hcl) .Marland Kitchen... Take one tablet twice daily  Orders: [facility]- Est Level  3 ([address])  Complete Medication List: 1)  Paxil Cr 25 Mg Tb24 (Paroxetine hcl) .... Take 1 tablet by mouth once a day 2)  Propranolol Hcl 20 Mg Tabs (Propranolol hcl) .Marland Kitchen.. 1 po bid 3)  Glucophage 500 Mg Tabs (Metformin hcl) .... Take one tablet twice daily 4)  Imitrex 25 Mg Tabs (Sumatriptan succinate) .Marland Kitchen.. 1 by mouth at onset of headache and repeat in 2 hours if needed 5)  Nortrel 1/35 (21) 1-35 Mg-mcg Tabs (Norethindrone-eth estradiol) 6)  Promethazine Hcl 25 Mg Tabs (Promethazine hcl) .... 1/2 to 1 tab by mouth  every 6 hours as needed for vomiting 7)  Gabapentin 600 Mg Tabs (Gabapentin) .... Take one tab tid  Patient Instructions: 1)  Stop the Metformin 2)  I will check your electrolytes today 3)  Restart the Metformin 1 week after your symptoms have resolved 4)  Continues the immodium 5)  Drink plenty of fluids  6)  Tylenol as needed for cramps 7)  If your pain changes, you get a high fever  > 101F, any blood in vomit or stool come back here or go to the ER  8)  Make a follow-up with Dr.Breen    Prevention & Chronic Care Immunizations   Influenza vaccine: Fluvax 3+  (12/06/2008)    Tetanus booster: Not documented    Pneumococcal vaccine: Not documented  Other Screening   Pap smear: Not documented   Smoking status: never  (09/08/2009)  Diabetes Mellitus   HgbA1C: 5.5  (12/13/2008)    Eye exam: Not documented    Foot exam: Not documented   High risk foot: Not documented   Foot care education: Not documented    Urine microalbumin/creatinine ratio: Not documented    Diabetes flowsheet reviewed?: Yes   Progress toward A1C goal: At goal  Lipids   Total Cholesterol: 148  (12/13/2008)   LDL: 76  (12/13/2008)   LDL Direct: Not documented   HDL: 38  (12/13/2008)   Triglycerides: 171  (12/13/2008)    SGOT (AST): 21  (12/13/2008)   SGPT (ALT): 21  (12/13/2008)   Alkaline phosphatase: 54  (12/13/2008)   Total bilirubin: 1.1  (12/13/2008)  Self-Management Support :   Personal Goals (by the next clinic visit) :     Personal A1C goal: 7  (09/08/2009)   Diabetes self-management support: Not documented    Lipid self-management support: Not documented

## 2010-03-19 NOTE — Progress Notes (Signed)
Summary: triage  Phone Note Call from Patient Call back at [phone]   Caller: Patient Summary of Call: Pt has diahrrea and fever can she be seen this am? Initial call taken by: Clydell Hakim,  September 03, 2009 8:33 AM  Follow-up for Phone Call        started late mon night. took immodium which did not help. to come now to see work in md Follow-up by: Golden Circle RN,  September 03, 2009 8:38 AM

## 2010-03-19 NOTE — Miscellaneous (Signed)
  Clinical Lists Changes  Observations: Added new observation of PAP DUE: 08/14/2011 (11/10/2009 14:15) Added new observation of FLUVAXDUE: 12/06/2009 (11/10/2009 14:15) Added new observation of HDLNXTDUE: 12/13/2013 (11/10/2009 14:15) Added new observation of LDLNXTDUE: 12/13/2013 (11/10/2009 14:15) Added new observation of HGBA1CNXTDUE: 01/16/2010 (11/10/2009 14:15) Added new observation of MICRALB DUE: 12/13/2009 (11/10/2009 14:15) Added new observation of CREATNXTDUE: 09/09/2010 (11/10/2009 14:15) Added new observation of POTASSIUMDUE: 09/09/2010 (11/10/2009 14:15) Added new observation of HTN PROGRESS: N/A (11/10/2009 14:15) Added new observation of HTN FSREVIEW: N/A (11/10/2009 14:15) Added new observation of LAST PAP DAT: 08/13/2008 (08/13/2008 14:18) Added new observation of PAP SMEAR: normal (08/13/2008 14:18)      Prevention & Chronic Care Immunizations   Influenza vaccine: Fluvax 3+  (12/06/2008)   Influenza vaccine due: 12/06/2009    Tetanus booster: Not documented    Pneumococcal vaccine: Not documented  Other Screening   Pap smear: normal  (08/13/2008)   Pap smear due: 08/14/2011   Smoking status: never  (09/08/2009)  Diabetes Mellitus   HgbA1C: 5.5  (10/17/2009)   Hemoglobin A1C due: 01/16/2010    Eye exam: Not documented    Foot exam: Not documented   High risk foot: Not documented   Foot care education: Not documented    Urine microalbumin/creatinine ratio: Not documented   Urine microalbumin/cr due: 12/13/2009  Lipids   Total Cholesterol: 148  (12/13/2008)   LDL: 76  (12/13/2008)   LDL Direct: 107  (10/17/2009)   HDL: 38  (12/13/2008)   Triglycerides: 171  (12/13/2008)    SGOT (AST): 21  (12/13/2008)   SGPT (ALT): 21  (12/13/2008)   Alkaline phosphatase: 54  (12/13/2008)   Total bilirubin: 1.1  (12/13/2008)  Self-Management Support :   Personal Goals (by the next clinic visit) :     Personal A1C goal: 7  (09/08/2009)   Diabetes  self-management support: Not documented    Lipid self-management support: Not documented    PAP Result Date:  08/13/2008 PAP Result:  normal PAP Next Due:  3 yr  Appended Document:  [facility] from [facility]; patient request for Nortrel on Aug 1, Sep 26, 2009.  Request for appt prior to refills.  Had been on Nortrel since Nov 2009; abnormal bleeding noted on visit dated 08/13/2008.  Normal PAP documented from that date. Pelvic US dated 01/02/2008 showing "multiple small cysts R ovary.  Should consider higher dose OCPs.  No endometrial pathology".

## 2010-03-19 NOTE — Assessment & Plan Note (Signed)
Summary: APPT 1:30,U/S CORT SHOT,[facility]   Vital Signs:  Patient profile:   35 year old female O2 Sat:      100 % Pulse rate:   76 / minute BP sitting:   119 / 82  Vitals Entered By: Lillia Pauls CMA (March 20, 2009 2:01 PM)  Primary Provider:  Paula Compton MD   History of Present Illness: Brought back today for US guided injection of her left shoulder simply did not get relief from last injection when scanned it is obvious that her bursa is deep to surface and without US guidance I don't think we could get CSI into the area  Allergies: 1)  Penicillin  Physical Exam  General:  Well-developed,well-nourished,in no acute distress; alert,appropriate and cooperative throughout examination Additional Exam:  MSK [place]  Bursa and Supraspinatus tendon visualized the Supraspinatus tendon is normal bursa is swollen with thick capsule   Impression & Recommendations:  Problem # 1:  ROTATOR CUFF INJURY, LEFT SHOULDER (ICD-726.10)  cleanse with alcohol Topical analgesic spray : Ethyl choride Joint left shoulder Approached first with skin wheal over tip of insetion of supraspinatus tendon into humeral head This was completed without difficulty Meds: 1% xylocaine  Under US guidance needle was introduced until the tip was seen inside the bursa.  Injection of bursa was visualized and steady expansion of the bursal sac noted as fluid introduced. Meds: 1 cc kenalog 40 + 4 ccs of marcaine Needle:25 g and 1.5 inch Aftercare instructions and Red flags advised  [place] IMages adn video were stored  Orders: Joint Aspirate / Injection, Large (20610) Kenalog 10 mg inj ([id number])  Problem # 2:  SHOULDER PAIN, LEFT (ICD-719.41)  Her updated medication list for this problem includes:    Flexeril 5 Mg Tabs (Cyclobenzaprine hcl) .Marland Kitchen... 1 tab by mouth two times a day for muscle spasm    Tramadol Hcl 50 Mg Tabs (Tramadol hcl) .Marland Kitchen... 1 by mouth qid as needed  keep on this regimen monitor degree of relief  post CSI  reck  3 weeks  Orders: Joint Aspirate / Injection, Large (20610) Kenalog 10 mg inj ([id number])  Complete Medication List: 1)  Paxil Cr 25 Mg Tb24 (Paroxetine hcl) .... Take 1 tablet by mouth once a day 2)  Propranolol Hcl 20 Mg Tabs (Propranolol hcl) .Marland Kitchen.. 1 po bid 3)  Glucophage 500 Mg Tabs (Metformin hcl) .... Take one tablet twice daily 4)  Imitrex 25 Mg Tabs (Sumatriptan succinate) .Marland Kitchen.. 1 by mouth at onset of headache and repeat in 2 hours if needed 5)  Nortrel 1/35 (21) 1-35 Mg-mcg Tabs (Norethindrone-eth estradiol) 6)  Promethazine Hcl 25 Mg Tabs (Promethazine hcl) .... 1/2 to 1 tab by mouth every 6 hours as needed for vomiting 7)  Flexeril 5 Mg Tabs (Cyclobenzaprine hcl) .Marland Kitchen.. 1 tab by mouth two times a day for muscle spasm 8)  Tramadol Hcl 50 Mg Tabs (Tramadol hcl) .Marland Kitchen.. 1 by mouth qid prn  Patient Instructions: 1)  Bursitis injected today 2)  Large swollen bursa seen 3)  2 days rest arm 4)  after 2 days swinging and circular motion 5)  after 1 week add wall climbs if not painful 6)  when you do not need left arm to lift - let it rest 7)  work restriction given yesterday continue this until we reck 8)  tonite Ice the shoulder 9)  recheck in 3 weeks

## 2010-03-19 NOTE — Assessment & Plan Note (Signed)
Summary: diarrhea/[place]/breen   Vital Signs:  Patient profile:   35 year old female Height:      66.5 inches Weight:      246 pounds BMI:     39.25 Temp:     99.1 degrees F oral Pulse rate:   92 / minute BP sitting:   139 / 89  (left arm) Cuff size:   regular  Vitals Entered By: Jimmy Footman, CMA (September 03, 2009 9:12 AM) CC: diarrhea, vomiting, bodyaches & dizziness x 2days Is Patient Diabetic? Yes Did you bring your meter with you today? No Comments Immodium------ doesn't help   Primary Care Provider:  Paula Compton MD  CC:  diarrhea, vomiting, and bodyaches & dizziness x 2days.  History of Present Illness: 1) Vomiting + diarrhea: x 2 1/2 days. Non-bloody, non bilious emesis with attempts to eat; able to keep liquids down. Non bloody diarrhea 3 times todays, at least 5 times last night, somewhat relieved by Imodium. + headache. + muscle aches. Temp to 100.0 at home. Denies chest pain, abdominal pain (when not having diarrhea), dyspnea, new foods, sick contacts, travel, URI symptoms, lethargy, focal neurological symptoms.   Habits & Providers  Alcohol-Tobacco-Diet     Tobacco Status: never  Current Medications (verified): 1)  Paxil Cr 25 Mg Tb24 (Paroxetine Hcl) .... Take 1 Tablet By Mouth Once A Day 2)  Propranolol Hcl 20 Mg Tabs (Propranolol Hcl) .Marland Kitchen.. 1 Po Bid 3)  Glucophage 500 Mg  Tabs (Metformin Hcl) .... Take One Tablet Twice Daily 4)  Imitrex 25 Mg  Tabs (Sumatriptan Succinate) .Marland Kitchen.. 1 By Mouth At Onset of Headache and Repeat in 2 Hours If Needed 5)  Nortrel 1/35 (21) 1-35 Mg-Mcg Tabs (Norethindrone-Eth Estradiol) 6)  Promethazine Hcl 25 Mg Tabs (Promethazine Hcl) .... 1/2 To 1 Tab By Mouth Every 6 Hours As Needed For Vomiting 7)  Flexeril 5 Mg Tabs (Cyclobenzaprine Hcl) .Marland Kitchen.. 1 Tab By Mouth Two Times A Day For Muscle Spasm 8)  Gabapentin 600 Mg Tabs (Gabapentin) .... Take One Tab Tid  Allergies (verified): 1)  Penicillin  Physical Exam  General:  obese, pleasant,  somewhat ill appearing  Eyes:  no conjunctivitis  Nose:  no congestion  Mouth:  moist membranes  Neck:  no lymphadenopathy   Lungs:  CTAB w/o wheeze or crackles  Heart:  Normal rate and regular rhythm. S1 and S2 normal without gallop, murmur, click, rub or other extra sounds. Abdomen:  obese abdomen, soft and non-tender., hyperactive bowel sounds, no rebound or guarding,  Pulses:  2+ radials  Extremities:  good cap refill  Neurologic:  alert & oriented X3.   Skin:  good turgor    Impression & Recommendations:  Problem # 1:  GASTROENTERITIS WITHOUT DEHYDRATION (ICD-558.9) Assessment New  Likely viral. Symptomatic treatment. Push fluids. Diarrhea control w/ Imodium, pepto-bismol. Nausea control w/ promethazine. Pain, fever control w/ NSAIDs, tylenol. Work note given. Red flags reviewed. Follow up as needed or at regularly scheduled appointment if better.   Orders: [facility]- Est Level  3 ([address])  Complete Medication List: 1)  Paxil Cr 25 Mg Tb24 (Paroxetine hcl) .... Take 1 tablet by mouth once a day 2)  Propranolol Hcl 20 Mg Tabs (Propranolol hcl) .Marland Kitchen.. 1 po bid 3)  Glucophage 500 Mg Tabs (Metformin hcl) .... Take one tablet twice daily 4)  Imitrex 25 Mg Tabs (Sumatriptan succinate) .Marland Kitchen.. 1 by mouth at onset of headache and repeat in 2 hours if needed 5)  Nortrel 1/35 (21) 1-35  Mg-mcg Tabs (Norethindrone-eth estradiol) 6)  Promethazine Hcl 25 Mg Tabs (Promethazine hcl) .... 1/2 to 1 tab by mouth every 6 hours as needed for vomiting 7)  Flexeril 5 Mg Tabs (Cyclobenzaprine hcl) .Marland Kitchen.. 1 tab by mouth two times a day for muscle spasm 8)  Gabapentin 600 Mg Tabs (Gabapentin) .... Take one tab tid  Patient Instructions: 1)  It was great to see you today!  2)  I hope you feel better. 3)  Take Imodium and Pepto-Bismol as directed for diarrhea 4)  Take promethazine for nausea. 5)  Take Ibuprofen, Tylenol for pain and fever 6)  Continue all other medications as before. 7)  Drink lots of fluids  (avoid sodas) and eat bland foods until you are better 8)  If you start having fever, vomtining or excessive not controlled by medications, severe weakness, breathing issues or other concerns, come back in  Prescriptions: PROMETHAZINE HCL 25 MG TABS (PROMETHAZINE HCL) 1/2 to 1 tab by mouth every 6 hours as needed for vomiting  #20 x 1   Entered and Authorized by:   Bobby Rumpf  MD   Signed by:   Bobby Rumpf  MD on 09/03/2009   Method used:   Electronically to        [facility]. [phone]* (retail)       [phone]       [place], [place]  [address]       Ph: [phone]       Fax: [phone]   RxID:   [id number]

## 2010-03-19 NOTE — Miscellaneous (Signed)
Summary: re: MRI/TS  Clinical Lists Changes called pt and lmvm to call us back. need to know, if PA has to be done for MRI. on line was unsuccessful.  wait.for pt to call back. APPT 03-01-09 AT 9:30AM AT [address]. Arlyss Repress CMA,  February 24, 2009 5:24 PM informed pt of appt. pt said, that she does not need PA ...order faxed.Arlyss Repress CMA,  February 25, 2009 12:26 PM

## 2010-03-19 NOTE — Assessment & Plan Note (Signed)
Summary: f/u,mc   Vital Signs:  Patient profile:   35 year old female BP sitting:   136 / 81  Vitals Entered By: Lillia Pauls CMA (June 09, 2009 9:08 AM)  Primary Provider:  Paula Compton MD   History of Present Illness: pt here today to f/u left shoulder pain, which she feels is getting better but the soreness is still present. She also states the nerve pain is better  Now taking 300 mgm three times a day not much in side effects - does help sleep at night  reaching behind back is sore w shoulder lifting over 10 lbs also hurts hand strength seems down to patient  Allergies: 1)  Penicillin  Physical Exam  General:  Well-developed,well-nourished,in no acute distress; alert,appropriate and cooperative throughout examination Msk:  LEFT Inspection reveals no abnormalities or assymetry; no atrophy noted; palpation is unremarkable;  ROM is full in all planes. specific strength testing of Rotator cuff mm reveals good strength throughout;  note this is a big improvement  no signs of impingement; speeds and yergason's tests normal;  no labral pathology noted; norm scapular function observed.  negative painful arc and no drop arm sign.  on back scratch left is within 1 inch of RT     Impression & Recommendations:  Problem # 1:  SHOULDER PAIN, LEFT (ICD-719.41)  Her updated medication list for this problem includes:    Flexeril 5 Mg Tabs (Cyclobenzaprine hcl) .Marland Kitchen... 1 tab by mouth two times a day for muscle spasm  suspect most of this ws 2/2 nerve sxs keep full ROM add theraband exercises and progress until reck  Problem # 2:  CERVICAL RADICULOPATHY, LEFT (ICD-723.4) This has nearly resolved with gabapentin will leave at 300 three times a day for now reck in 1 mo and consider weaning  Complete Medication List: 1)  Paxil Cr 25 Mg Tb24 (Paroxetine hcl) .... Take 1 tablet by mouth once a day 2)  Propranolol Hcl 20 Mg Tabs (Propranolol hcl) .Marland Kitchen.. 1 po bid 3)  Glucophage 500  Mg Tabs (Metformin hcl) .... Take one tablet twice daily 4)  Imitrex 25 Mg Tabs (Sumatriptan succinate) .Marland Kitchen.. 1 by mouth at onset of headache and repeat in 2 hours if needed 5)  Nortrel 1/35 (21) 1-35 Mg-mcg Tabs (Norethindrone-eth estradiol) 6)  Promethazine Hcl 25 Mg Tabs (Promethazine hcl) .... 1/2 to 1 tab by mouth every 6 hours as needed for vomiting 7)  Flexeril 5 Mg Tabs (Cyclobenzaprine hcl) .Marland Kitchen.. 1 tab by mouth two times a day for muscle spasm 8)  Gabapentin 600 Mg Tabs (Gabapentin) .... Take one tab tid  Patient Instructions: 1)  doing much better 2)  very little radicular symptoms from nerve 3)  exercises 4)  start with the motion exercises 5)  Then 1 set of the shoulder exercises w therabend 6)  gradually over the next 2 mos increase the exercises to 3 sets 7)  use shake out exercises w arms to relax trapezius 8)  Then recheck w me in [facility] July

## 2010-03-19 NOTE — Assessment & Plan Note (Signed)
Summary: F/U [facility]   Vital Signs:  Patient profile:   35 year old female BP sitting:   118 / 80  Vitals Entered By: Lillia Pauls CMA (March 19, 2009 9:09 AM)  Primary Provider:  Paula Compton MD   History of Present Illness: Anne Daniels got only a little relief of pain from left shoulder injedtion on last visit still painful arc still painful wioth abduction or floward flexion not waking from sleep hurts worse on days using arms in elevated or forward flexed positions  note she does feel her motion improved  neck is not painful  no numbness or tingling  Allergies: 1)  Penicillin  Physical Exam  General:  Obese,well-nourished,in no acute distress; alert,appropriate and cooperative throughout examination Msk:  Left Inspection reveals no abnormalities or assymetry; no atrophy noted; palpation is unremarkable;   ROM is limited in forward flexion and in abduction and elevation back scratch is better and is up to T7 Note on passive motion I can get fulll in all planes. specific strength testing of Rotator cuff mm reveals good strength throughout; mild signs of impingement; speeds and yergason's tests normal;  no labral pathology noted; norm scapular function observed.    no drop arm sign.  Additional Exam:  MSK [place] scan shows intact sub scap, supraspinatus and infraspinatus tendons bicipital tendon intact bursa is still swollen tnis is more than 3 inches from post shoulder and not in reach of post approach to injection   Impression & Recommendations:  Problem # 1:  SHOULDER PAIN, LEFT (ICD-719.41)  The following medications were removed from the medication list:    Vicodin 5-500 Mg Tabs (Hydrocodone-acetaminophen) .Marland Kitchen... 1/2 - 1 tab by mouth every 6 hours as needed for pain Her updated medication list for this problem includes:    Flexeril 5 Mg Tabs (Cyclobenzaprine hcl) .Marland Kitchen... 1 tab by mouth two times a day for muscle spasm    Tramadol Hcl 50 Mg Tabs (Tramadol hcl)  .Marland Kitchen... 1 by mouth qid prn  pain is still a problem try tramdol as she did not want to take stronger meds  Problem # 2:  ROTATOR CUFF INJURY, LEFT SHOULDER (ICD-726.10) this seems primarily a bursitis I really don';t think I was able to reach that area with injection due to her obesity  will try to bring back and do a superior approach injection under US guidance to see if I can get into the bursa  Complete Medication List: 1)  Paxil Cr 25 Mg Tb24 (Paroxetine hcl) .... Take 1 tablet by mouth once a day 2)  Propranolol Hcl 20 Mg Tabs (Propranolol hcl) .Marland Kitchen.. 1 po bid 3)  Glucophage 500 Mg Tabs (Metformin hcl) .... Take one tablet twice daily 4)  Imitrex 25 Mg Tabs (Sumatriptan succinate) .Marland Kitchen.. 1 by mouth at onset of headache and repeat in 2 hours if needed 5)  Nortrel 1/35 (21) 1-35 Mg-mcg Tabs (Norethindrone-eth estradiol) 6)  Promethazine Hcl 25 Mg Tabs (Promethazine hcl) .... 1/2 to 1 tab by mouth every 6 hours as needed for vomiting 7)  Flexeril 5 Mg Tabs (Cyclobenzaprine hcl) .Marland Kitchen.. 1 tab by mouth two times a day for muscle spasm 8)  Tramadol Hcl 50 Mg Tabs (Tramadol hcl) .Marland Kitchen.. 1 by mouth qid prn Prescriptions: TRAMADOL HCL 50 MG TABS (TRAMADOL HCL) 1 by mouth qid prn  #60 x 2   Entered and Authorized by:   Enid Baas MD   Signed by:   Enid Baas MD on 03/19/2009   Method  used:   Tax adviser to        [facility]. [phone]* (retail)       [phone]       [place], [place]  [address]       Ph: [phone]       Fax: [phone]   RxID:   [id number]

## 2010-03-19 NOTE — Letter (Signed)
Summary: Generic Letter  [facility]  [address]   [place], [place] [address]   Phone: [phone]  Fax: [phone]    10/22/2009  [facility] [address] [place], [place]  [address]  Dear Ms. Guldin,   I hope this letter finds you well.  I write with results of your recent labs.  There are no findings to suggest that you are near menopause (or ovarian failure).  Your thyroid test is likewise normal.   I am sending you a copy of the results for your records.        Sincerely,   Paula Compton MD

## 2010-06-30 NOTE — Procedures (Signed)
Anne Daniels, Anne Daniels              ACCOUNT NO.:  [id number]   MEDICAL RECORD NO.:  [id number]          PATIENT TYPE:  OUT   LOCATION:  SLEEP CENTER                 FACILITY:  [facility]   PHYSICIAN:  Clinton D. Maple Hudson, MD, FCCP, FACPDATE OF BIRTH:  08/07/1975   DATE OF STUDY:  09/04/2008                            NOCTURNAL POLYSOMNOGRAM   REFERRING PHYSICIAN:   REFERRING PHYSICIAN:  Doree Albee, MD   INDICATIONS FOR STUDY:  Insomnia with sleep apnea.   EPWORTH SLEEPINESS SCORE:  Epworth sleepiness score 22/24, BMI 38.5.  Weight 246 pounds, height 67 inches.  Neck 15.5 inches.   HOME MEDICATIONS:  Charted and reviewed.   SLEEP ARCHITECTURE:  Total sleep time 276 minutes with sleep efficiency  55.1%.  Stage I was 20.5%, stage II 58.9%, stage III 7.5, REM 12.9% of  total sleep time.  Sleep latency 47 minutes, REM latency 412 minutes,  awake after sleep onset 181 minutes, and arousal index 46.5.   BEDTIME MEDICATION:  Paxil, propranolol, and metformin.   RESPIRATORY DATA:  Apnea/hypopnea index (AHI) 4.6.  A total of 21 events  were scored including 4 obstructive apneas and 17 hypopneas.  Events  were not positional.  REM AHI 22 per hour.  An additional 23 respiratory  effort related arousals were counted for a cumulative respiratory  disturbance index (RDI) of 9.6 per hour.  This was a diagnostic NPSG  protocol.   OXYGEN DATA:  Moderately loud snoring with oxygen desaturation to a  nadir of 87%.  Mean oxygen saturation through the study was 96.4% on  room air.   CARDIAC DATA:  Normal sinus rhythm.   MOVEMENT/PARASOMNIA:  No significant movement disturbance.  Bathroom x1.   IMPRESSION/RECOMMENDATIONS:  1. Sleep architecture was significant for the patient being awake      between 11 p.m. and 2 a.m., reading.  This suggests she may benefit      from a sleep medication and management for insomnia.  2. Occasional respiratory events disturbing sleep.  By traditional AHI      (4.6  per hour) she was within normal limits but by adding marginal      additional respiratory effort related arousals for a cumulative      respiratory disturbance index (RDI) of 9.6 per hour, she would meet      current criteria for diagnosis of mild obstructive sleep      apnea/hypopnea syndrome.  Moderately loud snoring with oxygen      desaturation to a nadir of 87%.  3. Based on the RDI of 9.6 per hour, she might be considered for a      trial of CPAP therapy if more conservative measures are      insufficient.  Consider return for CPAP titration or evaluate for      alternative management as clinically appropriate.      Clinton D. Maple Hudson, MD, [facility], FACP  Diplomate, Biomedical engineer of Sleep Medicine  Electronically Signed     CDY/MEDQ  D:  09/14/2008 10:16:09  T:  09/14/2008 22:45:43  Job:  [id number]

## 2010-09-16 ENCOUNTER — Other Ambulatory Visit: Payer: Self-pay | Admitting: Family Medicine

## 2010-09-16 NOTE — Telephone Encounter (Signed)
Refill request

## 2010-11-05 ENCOUNTER — Other Ambulatory Visit: Payer: Self-pay | Admitting: Family Medicine

## 2010-11-05 MED ORDER — PROPRANOLOL HCL 20 MG PO TABS: 20.0000 mg | ORAL_TABLET | Freq: Two times a day (BID) | ORAL | Status: DC

## 2010-11-05 MED ORDER — PAROXETINE HCL ER 25 MG PO TB24: 25.0000 mg | ORAL_TABLET | Freq: Every day | ORAL | Status: DC

## 2010-11-05 MED ORDER — METFORMIN HCL 500 MG PO TABS: 500.0000 mg | ORAL_TABLET | Freq: Two times a day (BID) | ORAL | Status: DC

## 2010-11-13 LAB — URINALYSIS, ROUTINE W REFLEX MICROSCOPIC
Bilirubin Urine: NEGATIVE
Ketones, ur: NEGATIVE
Nitrite: POSITIVE — AB
Protein, ur: 100 — AB
Urobilinogen, UA: 1

## 2010-11-13 LAB — URINE MICROSCOPIC-ADD ON

## 2010-11-17 LAB — URINALYSIS, ROUTINE W REFLEX MICROSCOPIC
Glucose, UA: NEGATIVE
Nitrite: NEGATIVE
Specific Gravity, Urine: 1.027
pH: 5

## 2010-11-17 LAB — URINE MICROSCOPIC-ADD ON

## 2010-12-04 ENCOUNTER — Telehealth: Payer: Self-pay | Admitting: Family Medicine

## 2010-12-04 NOTE — Telephone Encounter (Signed)
Anne Daniels is scheduled to see Dr. Mauricio Po on 10/31, but she would like to speak to a nurse about what she can take for nausea until her appointment.

## 2010-12-04 NOTE — Telephone Encounter (Signed)
Called pt and left message to call back. Lorenda Hatchet, Renato Battles

## 2010-12-07 NOTE — Telephone Encounter (Signed)
Pt did not call back. Lorenda Hatchet, Renato Battles

## 2010-12-16 ENCOUNTER — Encounter: Payer: Self-pay | Admitting: Family Medicine

## 2010-12-23 ENCOUNTER — Encounter: Payer: Self-pay | Admitting: Family Medicine

## 2010-12-23 ENCOUNTER — Ambulatory Visit (INDEPENDENT_AMBULATORY_CARE_PROVIDER_SITE_OTHER): Payer: 59 | Admitting: Family Medicine

## 2010-12-23 DIAGNOSIS — R072 Precordial pain: Secondary | ICD-10-CM

## 2010-12-23 DIAGNOSIS — R51 Headache: Secondary | ICD-10-CM

## 2010-12-23 DIAGNOSIS — Z23 Encounter for immunization: Secondary | ICD-10-CM

## 2010-12-23 DIAGNOSIS — E119 Type 2 diabetes mellitus without complications: Secondary | ICD-10-CM

## 2010-12-23 DIAGNOSIS — R0789 Other chest pain: Secondary | ICD-10-CM

## 2010-12-23 LAB — COMPREHENSIVE METABOLIC PANEL
ALT: 36 U/L — ABNORMAL HIGH (ref 0–35)
AST: 59 U/L — ABNORMAL HIGH (ref 0–37)
Albumin: 4.4 g/dL (ref 3.5–5.2)
Alkaline Phosphatase: 78 U/L (ref 39–117)
BUN: 6 mg/dL (ref 6–23)
Chloride: 104 mEq/L (ref 96–112)
Potassium: 4.1 mEq/L (ref 3.5–5.3)
Sodium: 139 mEq/L (ref 135–145)

## 2010-12-23 LAB — LIPID PANEL
Cholesterol: 177 mg/dL (ref 0–200)
LDL Cholesterol: 108 mg/dL — ABNORMAL HIGH (ref 0–99)
Total CHOL/HDL Ratio: 5.4 Ratio
VLDL: 36 mg/dL (ref 0–40)

## 2010-12-23 MED ORDER — LOSARTAN POTASSIUM 25 MG PO TABS: 25.0000 mg | ORAL_TABLET | Freq: Every day | ORAL | Status: DC

## 2010-12-23 MED ORDER — PROMETHAZINE HCL 25 MG PO TABS: 25.0000 mg | ORAL_TABLET | Freq: Four times a day (QID) | ORAL | Status: AC | PRN

## 2010-12-23 MED ORDER — NORETHINDRONE-ETH ESTRADIOL 1-35 MG-MCG PO TABS: 1.0000 | ORAL_TABLET | Freq: Every day | ORAL | Status: DC

## 2010-12-23 NOTE — Patient Instructions (Signed)
It was a pleasure to see you today. Am so glad that you are back get on track.  As we discussed, I sent a new prescription for Cozaar 25 mg to take once daily. It is at [facility] on [address].  I will call you with the results of today's labs.  As we discussed it is important to get you in with your cardiologist Dr. Katrinka Blazing.  I will send him a letter detailing the reason for your visit there.  Annual eye exam.  I would like to see you back in about 6 weeks for a followup visit.

## 2010-12-23 NOTE — Progress Notes (Signed)
  Subjective:    Patient ID: Anne Daniels, female    DOB: 1976-02-10, 35 y.o.   MRN: [id number]  HPI Anne Daniels is here for follow up.  She has been very stressed since losing her job; foreclosure proceedings are underway on her home.  Had had resurgence in her headaches, which used to come 3-5 times/week and then went down to once every 5 months with propranolol.  Now, under stress, coming on 1-2x/week.  Typical for her migraines, no changes in characteristics or associated symptoms  Does get nausea, and vomits perhaps one time weekly.   Has been able to maintain her metformin and other meds; not taking tramadol or gabapentin anymore.  Never smoker. Last dilated DM eye exam 1 year ago with Dr Dione Booze, normal exam per her report.    In the past had taken lisinopril but developed persistent cough and was discontinued.  Never been on an ARB in the past due to cost; now with husband's newly acquired insurance.   Review of Systems  Occasional chest pain that starts in midline, radiates over L breast.  Onset may be with mild exertion, or at rest.  Pattern unchanged over 5 months.  No crescendoing in intensity or frequency.  NO associated diaphoresis or nausea with the chest pain.  Has seen Dr Verdis Prime for cardiac workup of syncope.  Not having syncopal episodes now.  No visual changes.    Objective:   Physical Exam Well appearing, no apparent distress.  HEENT Neck supple, no cervical adenopathy. No bruits. COR S1S2, no extra sounds, no reproducible tenderness along chest wall with palpation PULM Clear bilaterally, no rales or wheezes FEET: Palpable dp pulses bilaterally; sensation grossly intact in all areas with monofilament foot exam.      Assessment & Plan:

## 2010-12-23 NOTE — Assessment & Plan Note (Signed)
Similar in character to her migraines, exacerbated with stressors.  Nausea is prominent.  Will give her phenergan for intermittent use; prefer not to refill her triptan until chest pain better characterized.

## 2010-12-24 ENCOUNTER — Telehealth: Payer: Self-pay | Admitting: Family Medicine

## 2010-12-24 ENCOUNTER — Encounter: Payer: Self-pay | Admitting: Family Medicine

## 2010-12-24 ENCOUNTER — Telehealth: Payer: Self-pay | Admitting: *Deleted

## 2010-12-24 NOTE — Telephone Encounter (Signed)
Called home number to report lab results. Answering machine.  Left non-urgent message, will mail a copy to her. Shatasia Cutshaw O

## 2010-12-24 NOTE — Telephone Encounter (Signed)
Left message for patient to return call. Please tell her she has an appointment with Dr Halina Andreas Cardiology on 01/14/11 at 1:30.Ezeriah Luty, [name]

## 2010-12-28 NOTE — Telephone Encounter (Signed)
Called pt again to return call  Please tell pt: appt with Dr.Smith (see message) .Arlyss Repress

## 2011-01-27 ENCOUNTER — Ambulatory Visit (INDEPENDENT_AMBULATORY_CARE_PROVIDER_SITE_OTHER): Payer: 59 | Admitting: Family Medicine

## 2011-01-27 ENCOUNTER — Encounter: Payer: Self-pay | Admitting: Family Medicine

## 2011-01-27 DIAGNOSIS — R0789 Other chest pain: Secondary | ICD-10-CM

## 2011-01-27 DIAGNOSIS — R072 Precordial pain: Secondary | ICD-10-CM

## 2011-01-27 MED ORDER — BUSPIRONE HCL 10 MG PO TABS: 10.0000 mg | ORAL_TABLET | Freq: Two times a day (BID) | ORAL | Status: DC

## 2011-01-27 NOTE — Progress Notes (Signed)
  Subjective:    Patient ID: Anne Daniels, female    DOB: 1975-05-22, 35 y.o.   MRN: [id number]  HPI Anne Daniels is here today for follow up of L sided chest pain. Saw her cardiologist and had exercise stress test last Monday (Dec 3), was negative.  Continues to get L lower (under breast) chest pain that radiates along midline and around to her L shouulder, down to her elbow level.  Associated with nausea and once weekly she throws up.  No dyspnea associated.  Ongoing for the past 8 monts. She feels this is like the nausea and emesis she experienced when battling depression 17 yrs ago as a high school student, when her parents divorced and she assumed financial responsibility for her family.  At that time she was seeing a mental health provider, Dr. Arvilla Market, in [facility], and was helped greatly by this without pharmacologic intervention. At this time she feels very "anxious", hasn't slept well in 8 months.  House is at risk of foreclosure, she is studying while husband is working.  She feels poor self-esteem when house is not well kept and she is not working.  Blames herself.  Marital relationship is well, no concerns about her relationship with her husband.    Review of Systems No GERD symptoms; no cough or sputum production.  No diarrhea or constipation, no changes in bowel habits, no hematochezia. PHQ9 done today scores a 17 (3 pts #1,3,4,5; 2 pts #2,6; 1 pt#7; 0 pts #8,9).     Objective:   Physical Exam Well appearing, alert and in no acute distress.  HEENT Neck supple. Moist mucus membranes.  COR S1S2, no extra sounds PULM Clear bilat ABD Soft, nontender,nondistended. Mild reproducible discomfort in epigastrium with palpation (she notes this to be different in character than her chest pain). No masses noted.       Assessment & Plan:

## 2011-01-27 NOTE — Patient Instructions (Signed)
It was a pleasure to see you today.  I am starting you on a medication for anxiety called Buspirone 10mg ; take 1 tablet in the mid-morning and one in the evening.  May cause drowsiness.  If you continue with the chest/abdominal pain and vomiting, you may start Prilosec 20mg , take 1 daily.   Dr Arvilla Market, psychologist/psychiatrist.  I would like to see you back in early January to see how you are doing.

## 2011-01-27 NOTE — Assessment & Plan Note (Signed)
Ongoing L chest pain that has been associated with high levels of anxiety, especially financial stresses and threats of foreclosure.  Anne Daniels recalls her previous experience with depression and anxiety to be similar.  ETT negative, makes CAD a much less likely cause (<1% over 1 year). At this time, will try to treat her anxiety and sleep issues and see the effect on her chest discomfort.  May also add PPI once daily for possible gastritis that is associated.  She is on Paxil CR for treatment of Syncope, not depression, as she is quick to point out.  Discussed short-term use of BNZ versus more regular use of Buspar; will opt for the Buspar for now and see back in January.  Attempt to reach Dr. Arvilla Market, with whom she had a good therapeutic relationship 17 years ago.

## 2011-03-05 ENCOUNTER — Ambulatory Visit: Payer: 59 | Admitting: Family Medicine

## 2011-04-06 ENCOUNTER — Ambulatory Visit: Payer: 59 | Admitting: Family Medicine

## 2011-04-16 ENCOUNTER — Ambulatory Visit (INDEPENDENT_AMBULATORY_CARE_PROVIDER_SITE_OTHER): Payer: 59 | Admitting: Family Medicine

## 2011-04-16 ENCOUNTER — Telehealth: Payer: Self-pay | Admitting: *Deleted

## 2011-04-16 ENCOUNTER — Encounter: Payer: Self-pay | Admitting: Family Medicine

## 2011-04-16 VITALS — BP 116/84 | Temp 98.8°F | Ht 67.0 in | Wt 258.0 lb

## 2011-04-16 DIAGNOSIS — F339 Major depressive disorder, recurrent, unspecified: Secondary | ICD-10-CM

## 2011-04-16 DIAGNOSIS — G4733 Obstructive sleep apnea (adult) (pediatric): Secondary | ICD-10-CM

## 2011-04-16 DIAGNOSIS — R079 Chest pain, unspecified: Secondary | ICD-10-CM

## 2011-04-16 DIAGNOSIS — E119 Type 2 diabetes mellitus without complications: Secondary | ICD-10-CM

## 2011-04-16 DIAGNOSIS — F329 Major depressive disorder, single episode, unspecified: Secondary | ICD-10-CM

## 2011-04-16 MED ORDER — BUSPIRONE HCL 10 MG PO TABS: 10.0000 mg | ORAL_TABLET | Freq: Two times a day (BID) | ORAL | Status: AC

## 2011-04-16 NOTE — Assessment & Plan Note (Signed)
Patient with history of OSA; prior sleep study with CPAP through [facility].  Patient to have repeat sleep study for titration of CPAP, will see back after this.  May consider use of diphenhydramine 25mg  at bedtime to help initiate sleep as needed.  Discussed my hesitance to give more sedating meds to patient with OSA.

## 2011-04-16 NOTE — Telephone Encounter (Signed)
Patient was given information to schedule herself an appointment with the psychologist.Anne Daniels, [name]

## 2011-04-16 NOTE — Progress Notes (Signed)
  Subjective:    Patient ID: Anne Daniels, female    DOB: 10/02/1975, 36 y.o.   MRN: [id number]  HPI Here for follow up.  Has noticed that her chest pain  (underwent cardiac workup-- negative) resolved with use of PPI.    Feels unwell, attributes this largely to poor sleep.  Frequent awakenings (every 1-2 hours for 45 min); goes to bed late because she is studying late (til 1am).  Gets up around 11am most days.  Cat naps during the day; feels that she can doze off easily in a car if passenger (never at the wheel).  Snores very loudly (others in house can hear with her door closed).  Mother is present for visit, says unclear if observed apneic spells while asleep.  Prior diagnosis OSA, uses CPAP every night.  Has been several years since sleep study and Rx for CPAP.   Since then, has gained some weight.   Feels the Buspar and Paxil are doing a good job at controlling depressive symptoms and anxiety. Would like to see a psychologist to help her with this as well. Not interested in our psychologist   Had a nosebleed overnight 8 days ago; was blood in the CPAP machine when she woke up, stopped easily and has not had again since.   Diabetes: discussed eating habits.  Tends to go awhile without eating and then snack (Life cereal, fruit bars, sandwich).  Had seen Dr. Gerilyn Pilgrim in the past, did not feel she gained much from visit ("told me to eat more beans").   Review of Systems  See HPI.       Objective:   Physical Exam Well appearing, no apparent distress HEENT Nasal mucosa with dried mucus, no evident source of bleeding or masses.  Clear oropharynx. No cervical adenopathy COR S1S2, no extra sounds PULM Clear bilaterally, no rales or wheezes       Assessment & Plan:

## 2011-04-16 NOTE — Assessment & Plan Note (Signed)
Discussion of need to implement diet and activity changes as means to controlling DM and weight-related conditions.  Discussed RD consult, not felt she would benefit from this again. She prefers approach of keeping a 2-week food diary and trying to increase fiber/reduce starch and sweets in diet, get on regular meal schedule and avoid snacking when she is exceedingly hungry.  Also discussed routine mild exercise and concept of slow, incremental change and increases.  Follow up after food diary and recheck of A1C in 3 months.  Her A1C today is below 6.5%, however she has had a slow increase over the past year that correlates with increase in her weight. Discussed possibility of bariatrics as a possibility (has BMI >40; DM2; OSA; GERD).  TO CONTINUE DISCUSSION OF THIS AT SUBSEQUENT VISITS, IF PATIENT DOES NOT SHOW MARKED IMPROVEMENTS IN HER INDICES OF THESE CONDITIONS.

## 2011-04-16 NOTE — Assessment & Plan Note (Signed)
Has not recurred since starting PPIs.  Negative cardiac workup noted.

## 2011-04-16 NOTE — Assessment & Plan Note (Addendum)
Patient with depression and anxiety that is well controlled with buspar and Paxil CR.  She requests a counselor/psychologist, other than our PsyD at [facility].  WIll refer to Mirian Mo, PhD for counseling.   [address] [place], [place] [address] [phone] (Office)

## 2011-04-20 ENCOUNTER — Telehealth: Payer: Self-pay | Admitting: Family Medicine

## 2011-04-20 NOTE — Telephone Encounter (Signed)
Will forward to Dr Breen 

## 2011-04-20 NOTE — Telephone Encounter (Signed)
I was unable to see how to order a split night on Epic-- please call the [facility] sleep lab and ask them how they would like me to enter the order.  Thanks,  JB

## 2011-04-20 NOTE — Telephone Encounter (Signed)
Pt is already on a Cpap - needs orders to do a split night

## 2011-04-21 ENCOUNTER — Other Ambulatory Visit: Payer: Self-pay | Admitting: *Deleted

## 2011-04-21 DIAGNOSIS — G4733 Obstructive sleep apnea (adult) (pediatric): Secondary | ICD-10-CM

## 2011-04-21 NOTE — Telephone Encounter (Signed)
Test was ordered and scheduled for 05-10-11.Erina Hamme, [name]

## 2011-05-10 ENCOUNTER — Ambulatory Visit (HOSPITAL_BASED_OUTPATIENT_CLINIC_OR_DEPARTMENT_OTHER): Payer: 59 | Attending: Family Medicine | Admitting: General Practice

## 2011-05-10 VITALS — Ht 67.0 in | Wt 260.0 lb

## 2011-05-10 DIAGNOSIS — G4733 Obstructive sleep apnea (adult) (pediatric): Secondary | ICD-10-CM

## 2011-05-10 DIAGNOSIS — G4761 Periodic limb movement disorder: Secondary | ICD-10-CM | POA: Insufficient documentation

## 2011-05-14 DIAGNOSIS — R0609 Other forms of dyspnea: Secondary | ICD-10-CM

## 2011-05-14 DIAGNOSIS — R0989 Other specified symptoms and signs involving the circulatory and respiratory systems: Secondary | ICD-10-CM

## 2011-05-14 DIAGNOSIS — G4733 Obstructive sleep apnea (adult) (pediatric): Secondary | ICD-10-CM

## 2011-05-14 DIAGNOSIS — G4761 Periodic limb movement disorder: Secondary | ICD-10-CM

## 2011-05-15 NOTE — Procedures (Signed)
[name], Anne Daniels              ACCOUNT NO.:  [id number]  MEDICAL RECORD NO.:  [id number]          PATIENT TYPE:  OUT  LOCATION:  SLEEP CENTER                 FACILITY:  [facility]  PHYSICIAN:  Marry Kusch D. Maple Hudson, MD, FCCP, FACPDATE OF BIRTH:  05-21-75  DATE OF STUDY:  05/10/2011                           NOCTURNAL POLYSOMNOGRAM  REFERRING PHYSICIAN:  Paula Compton, MD  REFERRING PHYSICIAN:  Paula Compton, MD  INDICATION FOR STUDY:  Insomnia with sleep apnea.  EPWORTH SLEEPINESS SCORE:  10/24.  BMI 38.5, weight 246 pounds, height 67 inches, neck 15.5 inches.  Home medications are charted and reviewed.  SLEEP ARCHITECTURE:  Total sleep time 283 minutes with sleep efficiency 75.7%.  Stage I was 8.1%, stage II 72.8%, stage III absent, REM 19.1% of total sleep time.  Sleep latency 71 minutes, REM latency 115 minutes, awake after sleep onset 20.5 minutes.  Arousal index 64.2.  Bedtime medication:  Paxil CR, metformin, losartan, propranolol.  RESPIRATORY DATA:  Apnea/hypopnea index (AHI) 22.7 per hour.  A total of 107 events was scored, all as hypopneas associated with nonsupine sleep position and REM.  REM AHI 32.2 per hour.  There were insufficient numbers of early events to permit application of split CPAP titration protocol on this study night.  OXYGEN DATA:  Snoring with oxygen desaturation to a nadir of 88% and mean oxygen saturation through the study of 95.3% on room air.  CARDIAC DATA:  Normal sinus rhythm.  MOVEMENT/PARASOMNIA:  Frequent limb jerks.  A total of 171 limb jerks was counted of which 64 were associated with arousal or awakening for periodic limb movement with arousal index of 13.6 per hour.  No bathroom trips.  IMPRESSION/RECOMMENDATION: 1. Moderate obstructive sleep apnea/hypopnea syndrome, AHI 22.7 per     hour with nonsupine events.  REM AHI 32.2 per hour.  Moderate     snoring with oxygen desaturation to a nadir of 88% and mean oxygen     saturation through  the study of 95.3% on room air. 2. Sleep onset was delayed until after midnight, preventing protocol     requirements for application of split CPAP titration on this study     night. 3. Periodic limb movement with arousal syndrome, moderate.  A total of     171 limb jerks was counted, of which 64 were associated with     arousal or awakening for periodic limb movement with arousal index     of 13.6 per hour.  Specific therapy such as ReQuip or Mirapex might     be a consideration if clinically appropriate. 4. Previous diagnostic NPSG on September 04, 2008 had recorded an RDI of     9.6 per hour.  CPAP titration on October 24, 2008 to 12 CWP gave     AHI 0 per hour.     Hawkins Seaman D. Maple Hudson, MD, [facility], FACP Diplomate, American Board of Sleep Medicine    CDY/MEDQ  D:  05/14/2011 10:19:01  T:  05/14/2011 23:57:57  Job:  [id number]

## 2011-06-29 ENCOUNTER — Ambulatory Visit (INDEPENDENT_AMBULATORY_CARE_PROVIDER_SITE_OTHER): Payer: 59 | Admitting: Family Medicine

## 2011-06-29 ENCOUNTER — Encounter: Payer: Self-pay | Admitting: Family Medicine

## 2011-06-29 VITALS — BP 137/89 | HR 81 | Temp 98.1°F | Ht 67.0 in | Wt 260.0 lb

## 2011-06-29 DIAGNOSIS — M549 Dorsalgia, unspecified: Secondary | ICD-10-CM

## 2011-06-29 LAB — POCT URINALYSIS DIPSTICK
Glucose, UA: NEGATIVE
Ketones, UA: NEGATIVE
Protein, UA: 100
Spec Grav, UA: >=1.03

## 2011-06-29 NOTE — Patient Instructions (Signed)
Do back exercise 2 x per day.  Motrin 600mg  every 6 hours as needed. Return for recheck in 2 weeks- but if resolved completely can cancel.  If any new or worsening of symptoms, fever, problems with urination, bowel problems, return immediately for recheck.    Back Exercises Back exercises help treat and prevent back injuries. The goal of back exercises is to increase the strength of your abdominal and back muscles and the flexibility of your back. These exercises should be started when you no longer have back pain. Back exercises include:  Pelvic Tilt. Lie on your back with your knees bent. Tilt your pelvis until the lower part of your back is against the floor. Hold this position 5 to 10 sec and repeat 5 to 10 times.   Knee to Chest. Pull first 1 knee up against your chest and hold for 20 to 30 seconds, repeat this with the other knee, and then both knees. This may be done with the other leg straight or bent, whichever feels better.   Sit-Ups or Curl-Ups. Bend your knees 90 degrees. Start with tilting your pelvis, and do a partial, slow sit-up, lifting your trunk only 30 to 45 degrees off the floor. Take at least 2 to 3 seconds for each sit-up. Do not do sit-ups with your knees out straight. If partial sit-ups are difficult, simply do the above but with only tightening your abdominal muscles and holding it as directed.   Hip-Lift. Lie on your back with your knees flexed 90 degrees. Push down with your feet and shoulders as you raise your hips a couple inches off the floor; hold for 10 seconds, repeat 5 to 10 times.   Back arches. Lie on your stomach, propping yourself up on bent elbows. Slowly press on your hands, causing an arch in your low back. Repeat 3 to 5 times. Any initial stiffness and discomfort should lessen with repetition over time.   Shoulder-Lifts. Lie face down with arms beside your body. Keep hips and torso pressed to floor as you slowly lift your head and shoulders off the floor.   Do not overdo your exercises, especially in the beginning. Exercises may cause you some mild back discomfort which lasts for a few minutes; however, if the pain is more severe, or lasts for more than 15 minutes, do not continue exercises until you see your caregiver. Improvement with exercise therapy for back problems is slow.  See your caregivers for assistance with developing a proper back exercise program. Document Released: 03/11/2004 Document Revised: 01/21/2011 Document Reviewed: 02/01/2005 [employer] Patient Information 2012 [place], [place].

## 2011-06-29 NOTE — Progress Notes (Signed)
  Subjective:    Patient ID: Anne Daniels, female    DOB: Jan 07, 1976, 36 y.o.   MRN: [id number]  HPI  Lower back pain x 5 days: Describes pain as "Like a toothache" , no fever. No burning with urination, no urinary frequency,  No urinary retention.   Using cranberry juice and water- was concerned that it was a UTI she had a history of this 15 years ago. Seem to be improving over the past couple of days-but not resolved so wanted to have this checked out.Marland Kitchen   LMP Started yesterday.  Patient denies any trauma to lower back. No weakness in extremities. No fever. No weakness of legs or arms. No numbness in extremities. No sign of seizure. No loss of bowel or bladder control. Patient reports she did do Heavy house work 1 week ago- and this discomfort started 2 days after that.    Review of Systems As per above    Objective:   Physical Exam  Constitutional: She appears well-developed and well-nourished.  HENT:  Head: Normocephalic.  Eyes: Pupils are equal, round, and reactive to light.  Cardiovascular: Normal rate, regular rhythm and normal heart sounds.   No murmur heard. Pulmonary/Chest: Effort normal. No respiratory distress. She has no wheezes.  Musculoskeletal: Normal range of motion. She exhibits no edema.       Back exam: Normal ROM. + mild tenderness to palpation of paraspinal muscles of lower back.  No tenderness over spinous processes.  Normal strength bilateral.  Normal reflexes bilateral. Normal sensation bilateral.  Straight leg raise on right caused worsened right lower back pain- no shooting pain down leg.   Skin: No rash noted.  Psychiatric: She has a normal mood and affect.          Assessment & Plan:

## 2011-06-30 DIAGNOSIS — M549 Dorsalgia, unspecified: Secondary | ICD-10-CM | POA: Insufficient documentation

## 2011-06-30 NOTE — Assessment & Plan Note (Addendum)
Cause most likely musculoskeletal.  Pt concerned about uti- but no symptoms of uti and u/a negative except for blood and pt is currently menstruating.  Do back exercise 2 x per day.  Motrin 600mg  every 6 hours as needed. Return for recheck in 2 weeks- but if resolved completely can cancel. If any new or worsening of symptoms, fever, problems with urination, bowel problems, return immediately for recheck.

## 2011-07-13 ENCOUNTER — Ambulatory Visit: Payer: 59 | Admitting: Family Medicine

## 2011-11-04 ENCOUNTER — Ambulatory Visit (INDEPENDENT_AMBULATORY_CARE_PROVIDER_SITE_OTHER): Payer: 59 | Admitting: Family Medicine

## 2011-11-04 ENCOUNTER — Encounter: Payer: Self-pay | Admitting: Family Medicine

## 2011-11-04 VITALS — BP 120/84 | Temp 98.2°F | Ht 67.0 in | Wt 249.0 lb

## 2011-11-04 DIAGNOSIS — N12 Tubulo-interstitial nephritis, not specified as acute or chronic: Secondary | ICD-10-CM | POA: Insufficient documentation

## 2011-11-04 DIAGNOSIS — R300 Dysuria: Secondary | ICD-10-CM

## 2011-11-04 LAB — POCT UA - MICROSCOPIC ONLY: Epithelial cells, urine per micros: >20

## 2011-11-04 LAB — POCT URINALYSIS DIPSTICK

## 2011-11-04 MED ORDER — SULFAMETHOXAZOLE-TRIMETHOPRIM 800-160 MG PO TABS: 1.0000 | ORAL_TABLET | Freq: Two times a day (BID) | ORAL | Status: DC

## 2011-11-04 NOTE — Assessment & Plan Note (Addendum)
Clinically most consistent with pyelonephritis .  Stone less likley.  Requested urine culture results from urgent care- may not be available.  Will change Levaquin to  bactrim, discussed red flags for follow-up

## 2011-11-04 NOTE — Progress Notes (Signed)
  Subjective:    Patient ID: Dario Guardian, female    DOB: June 07, 1975, 36 y.o.   MRN: [id number]  HPI  4 days of urinary symptoms  3 days ago went to urgent care- was rxed Levaquin and pyridium for urinary tract infection.  Has take three days of abx, notes yesterday has another low grade fever, starting to have bilateral low back achiness L>R, headache and vomiting.  Dysuria and urinary frequency improved.  I have reviewed patient's  PMH, FH, and Social history and Medications as related to this visit. Sig for DM Review of Systems See HPI    Objective:   Physical Exam GEN: Alert & Oriented, No acute distress CV:  Regular Rate & Rhythm, no murmur Respiratory:  Normal work of breathing, CTAB Abd:  + BS, soft, no tenderness to palpation.  No flank pain         Assessment & Plan:

## 2011-11-04 NOTE — Patient Instructions (Addendum)
Pyelonephritis, Adult Pyelonephritis is a kidney infection. In general, there are 2 main types of pyelonephritis:  Infections that come on quickly without any warning (acute pyelonephritis).   Infections that persist for a long period of time (chronic pyelonephritis).  CAUSES  Two main causes of pyelonephritis are:  Bacteria traveling from the bladder to the kidney. This is a problem especially in pregnant women. The urine in the bladder can become filled with bacteria from multiple causes, including:   Inflammation of the prostate gland (prostatitis).   Sexual intercourse in females.   Bladder infection (cystitis).   Bacteria traveling from the bloodstream to the tissue part of the kidney.  Problems that may increase your risk of getting a kidney infection include:  Diabetes.   Kidney stones or bladder stones.   Cancer.   Catheters placed in the bladder.   Other abnormalities of the kidney or ureter.  SYMPTOMS   Abdominal pain.   Pain in the side or flank area.   Fever.   Chills.   Upset stomach.   Blood in the urine (dark urine).   Frequent urination.   Strong or persistent urge to urinate.   Burning or stinging when urinating.  DIAGNOSIS  Your caregiver may diagnose your kidney infection based on your symptoms. A urine sample may also be taken. TREATMENT  In general, treatment depends on how severe the infection is.   If the infection is mild and caught early, your caregiver may treat you with oral antibiotics and send you home.   If the infection is more severe, the bacteria may have gotten into the bloodstream. This will require intravenous (IV) antibiotics and a hospital stay. Symptoms may include:   High fever.   Severe flank pain.   Shaking chills.   Even after a hospital stay, your caregiver may require you to be on oral antibiotics for a period of time.   Other treatments may be required depending upon the cause of the infection.  HOME CARE  INSTRUCTIONS   Take your antibiotics as directed. Finish them even if you start to feel better.   Make an appointment to have your urine checked to make sure the infection is gone.   Drink enough fluids to keep your urine clear or pale yellow.   Take medicines for the bladder if you have urgency and frequency of urination as directed by your caregiver.  SEEK IMMEDIATE MEDICAL CARE IF:   You have a fever.   You are unable to take your antibiotics or fluids.   You develop shaking chills.   You experience extreme weakness or fainting.   There is no improvement after 2 days of treatment.  MAKE SURE YOU:  Understand these instructions.   Will watch your condition.   Will get help right away if you are not doing well or get worse.  Document Released: 02/01/2005 Document Revised: 01/21/2011 Document Reviewed: 07/08/2010 [employer] Patient Information 2012 [employer]. 

## 2011-11-14 ENCOUNTER — Other Ambulatory Visit: Payer: Self-pay | Admitting: Family Medicine

## 2011-11-26 ENCOUNTER — Other Ambulatory Visit: Payer: Self-pay | Admitting: Family Medicine

## 2011-12-14 ENCOUNTER — Ambulatory Visit (INDEPENDENT_AMBULATORY_CARE_PROVIDER_SITE_OTHER): Payer: 59 | Admitting: Family Medicine

## 2011-12-14 ENCOUNTER — Encounter: Payer: Self-pay | Admitting: Family Medicine

## 2011-12-14 VITALS — BP 112/79 | HR 64 | Ht 67.0 in | Wt 248.8 lb

## 2011-12-14 DIAGNOSIS — N920 Excessive and frequent menstruation with regular cycle: Secondary | ICD-10-CM

## 2011-12-14 DIAGNOSIS — E119 Type 2 diabetes mellitus without complications: Secondary | ICD-10-CM

## 2011-12-14 DIAGNOSIS — N926 Irregular menstruation, unspecified: Secondary | ICD-10-CM

## 2011-12-14 DIAGNOSIS — N939 Abnormal uterine and vaginal bleeding, unspecified: Secondary | ICD-10-CM

## 2011-12-14 LAB — CBC
HCT: 42.4 % (ref 36.0–46.0)
MCV: 92.4 fL (ref 78.0–100.0)
RBC: 4.59 MIL/uL (ref 3.87–5.11)
WBC: 7.4 10*3/uL (ref 4.0–10.5)

## 2011-12-14 LAB — POCT URINE PREGNANCY: Preg Test, Ur: NEGATIVE

## 2011-12-14 MED ORDER — MEDROXYPROGESTERONE ACETATE 10 MG PO TABS: 10.0000 mg | ORAL_TABLET | Freq: Every day | ORAL | Status: DC

## 2011-12-14 NOTE — Patient Instructions (Addendum)
It was a pleasure to see you today.  For your abnormal bleeding, I am prescribing Provera 10mg  one tablet daily for 10 days.  I am giving you 20 tabs in case we decide to increase dose to 20mg /day.  Lab work today; we will call to schedule ultrasound.   If not better or if recurring problem, may consider doing an endometrial biopsy.   FOLLOW UP APPOINTMENT IN 1 TO 2 WEEKS OR SOONER IF BLEEDING CONTINUES TO BE HEAVY DESPITE TREATMENT.

## 2011-12-15 ENCOUNTER — Encounter: Payer: Self-pay | Admitting: Family Medicine

## 2011-12-15 ENCOUNTER — Telehealth: Payer: Self-pay | Admitting: Family Medicine

## 2011-12-15 NOTE — Telephone Encounter (Signed)
Called and left voice mail for patient to let her know labwork was normal.  Will send letter to this effect.  Paula Compton, MD

## 2011-12-15 NOTE — Progress Notes (Signed)
  Subjective:    Patient ID: Anne Daniels, female    DOB: [birth year], 36 y.o.   MRN: [id number]  HPI Lilliam presents today with complaint of irregular menses over the past month.  She takes NOrtrel OCP for menstrual regulation (has done so since age 60); had last "normal" menses on Sept 20th (during the blank pills on her OCP pack).  Lasted the usual 5-7 days, 3 pads/day.  On Oct 2-5 she had three days of heavy vaginal bleeding, which stopped completely before restarting again on Oct 10th.  Bled heavily until Oct 13th, then stopped for 2 days until Oct 15th.  On Oct 15th she resumed bleeding and has been bleeding since that time.  Initially looked like "tar", now more red blood.  On Oct 24th passed a clot "the size of a golf ball".  No other discharge. She has had some L sided pelvic pain with the bleeding, feels "like a toothache". No dysuria.  Last intercourse end-August with husband.  No history STI.  Never been pregnant.   Family Hx: several family members with early menopause in their early 56s (excluding mother).    Patient had TSH checked in 10/2009 (was 1.4).    Review of Systems  No fevers or chills, no dizziness with position changes.  Appetite normal.  No vomiting or nausea.      Objective:   Physical Exam Well appearing, no apparent distress HEENT Neck supple. Moist mucus membranes.  ABD soft, nontender.  GYN: Normal external genitalia; normal vaginal mucosa.  Old blood from os.  Bimanual exam with L adnexal tenderness, no masses on adnexae or uterus.        Assessment & Plan:

## 2011-12-15 NOTE — Assessment & Plan Note (Signed)
Suspect anovulatory bleeding, breakthrough.  With L pelvic tenderness will order transvaginal US.  Provera 10mg  daily for 10 days; may increase to 20mg /day if needed.  Discussed possibility of EMB if recurrent or not resolving. Hgb checked today in POC testing, however will check CBC as well.  TSH, FSH today.

## 2011-12-17 ENCOUNTER — Ambulatory Visit
Admission: RE | Admit: 2011-12-17 | Discharge: 2011-12-17 | Disposition: A | Discharge status: Home / Self Care | Payer: 59 | Source: Ambulatory Visit | Attending: Family Medicine | Admitting: Family Medicine

## 2011-12-17 DIAGNOSIS — N939 Abnormal uterine and vaginal bleeding, unspecified: Secondary | ICD-10-CM

## 2011-12-17 DIAGNOSIS — N920 Excessive and frequent menstruation with regular cycle: Secondary | ICD-10-CM

## 2011-12-21 ENCOUNTER — Telehealth: Payer: Self-pay | Admitting: Family Medicine

## 2011-12-21 ENCOUNTER — Encounter: Payer: Self-pay | Admitting: Family Medicine

## 2011-12-21 NOTE — Telephone Encounter (Signed)
Called patient to report results; "the [employer] customer you have called is unavailable".  No message left.  Will send letter.  Paula Compton, MD

## 2011-12-31 ENCOUNTER — Other Ambulatory Visit: Payer: Self-pay | Admitting: Family Medicine

## 2012-01-17 ENCOUNTER — Other Ambulatory Visit: Payer: Self-pay | Admitting: Family Medicine

## 2012-01-28 ENCOUNTER — Ambulatory Visit: Payer: 59 | Admitting: Family Medicine

## 2012-02-17 ENCOUNTER — Telehealth: Payer: Self-pay | Admitting: Family Medicine

## 2012-02-17 NOTE — Telephone Encounter (Signed)
I called patient to inquire about her welfare; she has an appointment with me tomorrow.  She reports that she is continuing with abnormal uterine bleeding, for which she was seen in end-Oct 2013.  She was put on short course of provera and noticed marked improvement, with cessation of her bleeding.  Resumed continuous bleeding in November.  In the month of December she had bleeding throughout the month until Dec 24th, when she stopped until Sunday, December 29th.   She has continued to take Nortrel OCPs for menstrual control, with inactive pills starting on Dec 29th (the start of her most recent bleeding episode).  She is using 2-3 pads/day since 12/29.  We reviewed the results of her lab and [place] workup in October; will plan for her to take Ibuprofen 600-800mg  before leaving home for the office tomorrow, and to have a Upreg done upon arrival in our office in the event we decide to do an EMB.  Overall, seems low likelihood of endometrial hyperplasia given that she has been on Notrel 1/35 since adolescence for control of menses.  Also, the fact that she began her placebo pills on 12/29 and began bleeding then may mean she has restored her usual cyclicity. JB

## 2012-02-18 ENCOUNTER — Ambulatory Visit (INDEPENDENT_AMBULATORY_CARE_PROVIDER_SITE_OTHER): Payer: 59 | Admitting: Family Medicine

## 2012-02-18 ENCOUNTER — Encounter: Payer: Self-pay | Admitting: Family Medicine

## 2012-02-18 VITALS — BP 107/78 | HR 66 | Temp 98.5°F | Ht 67.0 in | Wt 246.5 lb

## 2012-02-18 DIAGNOSIS — N939 Abnormal uterine and vaginal bleeding, unspecified: Secondary | ICD-10-CM

## 2012-02-18 DIAGNOSIS — N926 Irregular menstruation, unspecified: Secondary | ICD-10-CM

## 2012-02-18 LAB — POCT URINE PREGNANCY: Preg Test, Ur: NEGATIVE

## 2012-02-18 NOTE — Progress Notes (Signed)
  Subjective:    Patient ID: Anne Daniels, female    DOB: 1975/06/19, 37 y.o.   MRN: [id number]  HPI Here for follow up of AUB.  She reports that she had taken the Provera in end-Oct, with resolution of the bleeding.  By early December she began with bleeding, which persisted until 12/24.  She went without bleeding until 12/29, which is when the placebo pills in her Nortrel 1/35 started.  She is only spotting at this point. She denies missing any pills.  HAs been on OCPs since age 53 for menstrual regulation, with Nortrel specifically for about 7 years now.   The initial onset of her menstrual irregularities began around late Aug or early Sept (before that she says she had absolute regularity as determined by her OCP pack).  Has never been on any other delivery system (such as ring, patch, implantable, or IUD).  She remarks that she has had terrible mood swings and irritability off and on since the onset of her menstrual irregularity.   Previously had PAPs done by Dr. Ambrose Mantle, last one about 3 years ago.  She had one "abnormal" Pap in around 1998, but has had several normal ones since then.    Family Hx; there is no history of ovarian, uterine, cervical or breast cancer in her family.  Her paternal grandfather had leukemia.   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 Review of Systems See above.  Of note, pregnancy test done today is negative.     Objective:   Physical Exam Well appearing, no apparent distress        Assessment & Plan:

## 2012-02-18 NOTE — Assessment & Plan Note (Signed)
Most likely BTB from [facility]; she had improvement with short course of Provera, which may be partly responsible for the subsequent heavier menses in December.  She is now tapering off from a normally-timed menses (as dictated by her OCP pack).  Will not intervene at this time; if she has recurrent AUB, may consider short course of Estrace (estradiol).  Also to consider use of another OCP versus Nuva Ring. Endometrial hyperplasia is unlikely given patient's age and the fact that she has been on estrogen/progesterone OCPs since age 45.  Would consider EMB if AUB continues to occur.  Discussed this with Anne Daniels today. For follow up and Pap smear in 3 months (she is to call sooner if continues to occur).

## 2012-02-18 NOTE — Patient Instructions (Addendum)
It was a pleasure to see you today!  For the time being, we will stick with the Notrel 1/35 as you have been taking it.   If you experience bleeding at a time other than when you expect it, please call.  We may consider a brief course of estrogens to stop the bleeding.   Schedule follow up in 2-3 months at a time when you are not bleeding, for routine PAP smear.

## 2012-03-07 ENCOUNTER — Other Ambulatory Visit: Payer: Self-pay | Admitting: Family Medicine

## 2012-04-22 ENCOUNTER — Other Ambulatory Visit: Payer: Self-pay | Admitting: Family Medicine

## 2012-05-01 ENCOUNTER — Other Ambulatory Visit: Payer: Self-pay | Admitting: Family Medicine

## 2012-05-03 ENCOUNTER — Other Ambulatory Visit: Payer: Self-pay | Admitting: Family Medicine

## 2012-06-19 ENCOUNTER — Emergency Department (HOSPITAL_COMMUNITY)
Admission: EM | Admit: 2012-06-19 | Discharge: 2012-06-19 | Disposition: A | Discharge status: Home / Self Care | Payer: 59 | Source: Home / Self Care

## 2012-06-19 ENCOUNTER — Encounter (HOSPITAL_COMMUNITY): Payer: Self-pay | Admitting: Emergency Medicine

## 2012-06-19 DIAGNOSIS — A0811 Acute gastroenteropathy due to Norwalk agent: Secondary | ICD-10-CM

## 2012-06-19 DIAGNOSIS — J309 Allergic rhinitis, unspecified: Secondary | ICD-10-CM

## 2012-06-19 DIAGNOSIS — J302 Other seasonal allergic rhinitis: Secondary | ICD-10-CM

## 2012-06-19 HISTORY — DX: Type 2 diabetes mellitus without complications: E11.9

## 2012-06-19 HISTORY — DX: Migraine, unspecified, not intractable, without status migrainosus: G43.909

## 2012-06-19 MED ORDER — ONDANSETRON 4 MG PO TBDP
8.0000 mg | ORAL_TABLET | Freq: Once | ORAL | Status: AC
  Administered 2012-06-19: 8 mg via ORAL

## 2012-06-19 MED ORDER — ONDANSETRON HCL 4 MG PO TABS: 4.0000 mg | ORAL_TABLET | Freq: Four times a day (QID) | ORAL | Status: DC

## 2012-06-19 MED ORDER — ONDANSETRON 4 MG PO TBDP
ORAL_TABLET | ORAL | Status: AC
  Filled 2012-06-19: qty 2

## 2012-06-19 MED ORDER — FEXOFENADINE HCL 180 MG PO TABS: 180.0000 mg | ORAL_TABLET | Freq: Every day | ORAL | Status: DC

## 2012-06-19 MED ORDER — BUDESONIDE 32 MCG/ACT NA SUSP: 1.0000 | Freq: Two times a day (BID) | NASAL | Status: DC

## 2012-06-19 NOTE — ED Notes (Signed)
Pt c/o persistent cough onset Wednesday [facility] and was dx w/bronchitis; given cough medication Sx also include: chills, vomiting, nauseas, rib pain due to cough  Denies: f/d, runny nose, nasal congestion  She is alert and oriented w/no signs of acute distress.

## 2012-06-19 NOTE — ED Provider Notes (Signed)
History     CSN: [id number]  Arrival date & time 06/19/12  1445   None     Chief Complaint  Patient presents with  . Cough    (Consider location/radiation/quality/duration/timing/severity/associated sxs/prior treatment) Patient is a 37 y.o. female presenting with cough. The history is provided by the patient.  Cough Cough characteristics:  Non-productive, dry and hacking Severity:  Moderate Onset quality:  Gradual Duration:  5 days Timing:  Intermittent Progression:  Unchanged Chronicity:  New Smoker: no   Context: weather changes   Ineffective treatments:  Cough suppressants Associated symptoms: rhinorrhea     Past Medical History  Diagnosis Date  . Diabetes mellitus without complication   . Migraines     Past Surgical History  Procedure Laterality Date  . Knee surgery      No family history on file.  History  Substance Use Topics  . Smoking status: Never Smoker   . Smokeless tobacco: Never Used  . Alcohol Use: No    OB History   Grav Para Term Preterm Abortions TAB SAB Ect Mult Living                  Review of Systems  Constitutional: Negative.   HENT: Positive for congestion, rhinorrhea and postnasal drip.   Respiratory: Positive for cough.   Gastrointestinal: Positive for nausea and vomiting. Negative for diarrhea.  Skin: Negative.     Allergies  Penicillins  Home Medications   Current Outpatient Rx  Name  Route  Sig  Dispense  Refill  . BENZONATATE PO   Oral   Take by mouth.         . budesonide (RHINOCORT AQUA) 32 MCG/ACT nasal spray   Nasal   Place 1 spray into the nose 2 (two) times daily. One spray each nostril bid   1 Bottle   1   . dextromethorphan-guaiFENesin (ROBITUSSIN-DM) 10-100 MG/5ML liquid   Oral   Take 5 mLs by mouth every 4 (four) hours as needed for cough.         . fexofenadine (ALLEGRA) 180 MG tablet   Oral   Take 1 tablet (180 mg total) by mouth daily.   30 tablet   1   . gabapentin (NEURONTIN) 600  MG tablet   Oral   Take 600 mg by mouth 3 (three) times daily.           Marland Kitchen losartan (COZAAR) 25 MG tablet      take 1 tablet by mouth once daily   30 tablet   11   . medroxyPROGESTERone (PROVERA) 10 MG tablet   Oral   Take 1 tablet (10 mg total) by mouth daily.   20 tablet   0   . metFORMIN (GLUCOPHAGE) 500 MG tablet      take 1 tablet by mouth twice a day WITH A MEAL   180 tablet   2   . NORTREL 1/35, 28, tablet      take 1 tablet by mouth once daily   28 tablet   3   . ondansetron (ZOFRAN) 4 MG tablet   Oral   Take 1 tablet (4 mg total) by mouth every 6 (six) hours.   6 tablet   0   . PARoxetine (PAXIL-CR) 25 MG 24 hr tablet      take 1 tablet by mouth once daily   30 tablet   4   . propranolol (INDERAL) 20 MG tablet      take 1  tablet by mouth twice a day   60 tablet   4     BP 125/81  Pulse 63  Temp(Src) 98.6 F (37 C) (Oral)  Resp 20  SpO2 98%  LMP 06/09/2012  Physical Exam  Nursing note and vitals reviewed. Constitutional: She is oriented to person, place, and time. She appears well-developed and well-nourished.  HENT:  Left Ear: External ear normal.  Mouth/Throat: Oropharynx is clear and moist.  Eyes: Conjunctivae are normal. Pupils are equal, round, and reactive to light.  Neck: Normal range of motion. Neck supple.  Cardiovascular: Regular rhythm and intact distal pulses.   Pulmonary/Chest: Breath sounds normal.  Abdominal: Soft. Bowel sounds are normal. There is no tenderness.  Neurological: She is alert and oriented to person, place, and time.  Skin: Skin is warm and dry.  Facial petechiae from vomiting.    ED Course  Procedures (including critical care time)  Labs Reviewed - No data to display No results found.   1. Seasonal allergic reaction   2. Gastroenteritis due to norovirus       MDM          Linna Hoff, MD 06/19/12 [phone]

## 2012-06-27 ENCOUNTER — Encounter: Payer: Self-pay | Admitting: Family Medicine

## 2012-06-27 NOTE — Progress Notes (Signed)
Patient ID: Anne Daniels, female   DOB: [birth year], 37 y.o.   MRN: [id number] Faxed note from [facility] on [address] notifying me of patient visit on Friday, May 2 at 2:42PM for diagnosis of bronchitis, prescribed benzonatate and codeine cough syrup.  Patient also seen in ED on Monday, May 5th (note filed 4:15PM) for gastroenteritis and seasonal allergic cough.  No record of patient call to our office for advice/visit preceding either of these episodes. Paula Compton, MD

## 2012-08-30 ENCOUNTER — Other Ambulatory Visit: Payer: Self-pay | Admitting: Family Medicine

## 2012-10-03 ENCOUNTER — Other Ambulatory Visit: Payer: Self-pay | Admitting: Family Medicine

## 2012-11-07 ENCOUNTER — Other Ambulatory Visit: Payer: Self-pay | Admitting: Family Medicine

## 2012-11-10 ENCOUNTER — Other Ambulatory Visit: Payer: Self-pay | Admitting: Family Medicine

## 2012-11-10 MED ORDER — PROPRANOLOL HCL 20 MG PO TABS: ORAL_TABLET | ORAL | Status: DC

## 2012-11-28 ENCOUNTER — Other Ambulatory Visit: Payer: Self-pay | Admitting: Family Medicine

## 2012-12-05 ENCOUNTER — Encounter: Payer: Self-pay | Admitting: Family Medicine

## 2012-12-05 ENCOUNTER — Ambulatory Visit (HOSPITAL_BASED_OUTPATIENT_CLINIC_OR_DEPARTMENT_OTHER)
Admission: RE | Admit: 2012-12-05 | Discharge: 2012-12-05 | Disposition: A | Discharge status: Home / Self Care | Payer: 59 | Source: Ambulatory Visit | Attending: Family Medicine | Admitting: Family Medicine

## 2012-12-05 ENCOUNTER — Ambulatory Visit (INDEPENDENT_AMBULATORY_CARE_PROVIDER_SITE_OTHER): Payer: 59 | Admitting: Family Medicine

## 2012-12-05 VITALS — BP 126/86 | HR 71 | Ht 67.0 in | Wt 252.0 lb

## 2012-12-05 DIAGNOSIS — M25562 Pain in left knee: Secondary | ICD-10-CM

## 2012-12-05 DIAGNOSIS — M25569 Pain in unspecified knee: Secondary | ICD-10-CM | POA: Insufficient documentation

## 2012-12-05 DIAGNOSIS — M25469 Effusion, unspecified knee: Secondary | ICD-10-CM | POA: Insufficient documentation

## 2012-12-05 NOTE — Patient Instructions (Signed)
You have a flare of knee arthritis. Take tylenol 500mg  1-2 tabs three times a day for pain. Aleve 1-2 tabs twice a day with food Glucosamine sulfate 750mg  twice a day is a supplement that may help. Capsaicin topically up to four times a day may also help with pain. Cortisone injections are an option - you were given one today. If cortisone injections do not help, there are different types of shots that may help but they take longer to take effect. It's important that you continue to stay active. If you are overweight, try to lose weight through diet and exercise. Straight leg raises, leg raises with foot turned outward, knee extensions 3 sets of 10 once a day (add ankle weight if these become too easy). Consider physical therapy to strengthen muscles around the joint that hurts to take pressure off of the joint itself. Shoe inserts with good arch support may be helpful. Walker or cane if needed. Heat or ice 15 minutes at a time 3-4 times a day as needed to help with pain. Water aerobics and cycling with low resistance are the best two types of exercise for arthritis. Follow up with me in 5-6 weeks or as needed.

## 2012-12-06 ENCOUNTER — Encounter: Payer: Self-pay | Admitting: Family Medicine

## 2012-12-06 NOTE — Assessment & Plan Note (Signed)
consistent with flare of mild arthritis.  Tylenol, nsaids, glucosamine, capsaicin discussed.  Intraarticular cortisone injection given.  Home exercise program reviewed.  Consider formal PT, arch support.  Heat or ice as needed.  F/u in 5-6 weeks or prn.  After informed written consent, patient was seated on exam table. Left knee was prepped with alcohol swab and utilizing anteromedial approach, patient's left knee was injected intraarticularly with 3:1 marcaine: depomedrol. Patient tolerated the procedure well without immediate complications.

## 2012-12-06 NOTE — Progress Notes (Signed)
Patient ID: Anne Daniels, female   DOB: 02-Apr-1975, 37 y.o.   MRN: [id number]  PCP: Barbaraann Barthel, MD  Subjective:   HPI: Patient is a 37 y.o. female here for left knee pain.  Patient reports 24 years ago she had knee surgery of right knee - severe injury that involved a lot of ligament tears - still has two screws in knee. Done well since then but reports past couple weeks has felt sharp pain inner side of knee and under kneecap. Worse with stooping, bending and she's been doing more of this recently. On feet 8-9 hours a day. Tried brace, advil, aleve, icing, elevating. No locking, giving out.  Past Medical History  Diagnosis Date  . Diabetes mellitus without complication   . Migraines   . Syncope and collapse     Current Outpatient Prescriptions on File Prior to Visit  Medication Sig Dispense Refill  . losartan (COZAAR) 25 MG tablet take 1 tablet by mouth once daily  30 tablet  11  . metFORMIN (GLUCOPHAGE) 500 MG tablet take 1 tablet by mouth twice a day with food  180 tablet  2  . NORTREL 1/35, 28, tablet take 1 tablet by mouth once daily  28 tablet  3  . PARoxetine (PAXIL-CR) 25 MG 24 hr tablet take 1 tablet by mouth once daily  30 tablet  0  . propranolol (INDERAL) 20 MG tablet take 1 tablet by mouth twice a day  60 tablet  1  . gabapentin (NEURONTIN) 600 MG tablet Take 600 mg by mouth 3 (three) times daily.         No current facility-administered medications on file prior to visit.    Past Surgical History  Procedure Laterality Date  . Knee surgery    . Wisdom tooth extraction      Allergies  Allergen Reactions  . Penicillins     History   Social History  . Marital Status: Married    Spouse Name: N/A    Number of Children: N/A  . Years of Education: N/A   Occupational History  . Not on file.   Social History Main Topics  . Smoking status: Never Smoker   . Smokeless tobacco: Never Used  . Alcohol Use: No  . Drug Use: No  . Sexual Activity: Not on  file   Other Topics Concern  . Not on file   Social History Narrative  . No narrative on file    Family History  Problem Relation Age of Onset  . Diabetes Mother   . Hyperlipidemia Mother   . Hypertension Mother   . Hypertension Father   . Hyperlipidemia Brother   . Hypertension Brother   . Sudden death Neg Hx   . Heart attack Neg Hx     BP 126/86  Pulse 71  Ht 5\' 7"  (1.702 m)  Wt 252 lb (114.306 kg)  BMI 39.46 kg/m2  LMP 11/24/2012  Review of Systems: See HPI above.    Objective:  Physical Exam:  Gen: NAD  Left knee: Minimal effusion.  No other gross deformity, ecchymoses. TTP medial joint line and post patellar facets. FROM. Negative ant/post drawers. Negative valgus/varus testing. Negative lachmanns. Negative mcmurrays, apleys, patellar apprehension, clarkes. NV intact distally.    Assessment & Plan:  1. Left knee pain - consistent with flare of mild arthritis.  Tylenol, nsaids, glucosamine, capsaicin discussed.  Intraarticular cortisone injection given.  Home exercise program reviewed.  Consider formal PT, arch support.  Heat or  ice as needed.  F/u in 5-6 weeks or prn.  After informed written consent, patient was seated on exam table. Left knee was prepped with alcohol swab and utilizing anteromedial approach, patient's left knee was injected intraarticularly with 3:1 marcaine: depomedrol. Patient tolerated the procedure well without immediate complications.

## 2012-12-26 ENCOUNTER — Other Ambulatory Visit: Payer: Self-pay | Admitting: Family Medicine

## 2012-12-27 ENCOUNTER — Other Ambulatory Visit: Payer: Self-pay | Admitting: Family Medicine

## 2013-01-04 ENCOUNTER — Other Ambulatory Visit: Payer: Self-pay | Admitting: Family Medicine

## 2013-01-26 ENCOUNTER — Ambulatory Visit (INDEPENDENT_AMBULATORY_CARE_PROVIDER_SITE_OTHER): Payer: 59 | Admitting: Family Medicine

## 2013-01-26 VITALS — BP 126/70 | HR 79 | Ht 67.0 in | Wt 256.0 lb

## 2013-01-26 DIAGNOSIS — R300 Dysuria: Secondary | ICD-10-CM

## 2013-01-26 DIAGNOSIS — Z23 Encounter for immunization: Secondary | ICD-10-CM

## 2013-01-26 DIAGNOSIS — E781 Pure hyperglyceridemia: Secondary | ICD-10-CM

## 2013-01-26 DIAGNOSIS — E119 Type 2 diabetes mellitus without complications: Secondary | ICD-10-CM

## 2013-01-26 LAB — POCT UA - MICROSCOPIC ONLY: Epithelial cells, urine per micros: >20

## 2013-01-26 LAB — POCT URINALYSIS DIPSTICK
Bilirubin, UA: NEGATIVE
Glucose, UA: NEGATIVE
Ketones, UA: NEGATIVE
Spec Grav, UA: >=1.03
Urobilinogen, UA: 0.2

## 2013-01-26 LAB — COMPREHENSIVE METABOLIC PANEL
ALT: 27 U/L (ref 0–35)
Alkaline Phosphatase: 68 U/L (ref 39–117)
CO2: 25 mEq/L (ref 19–32)
Creat: 0.85 mg/dL (ref 0.50–1.10)
Total Bilirubin: 1.1 mg/dL (ref 0.3–1.2)

## 2013-01-26 LAB — LIPID PANEL
Cholesterol: 182 mg/dL (ref 0–200)
Total CHOL/HDL Ratio: 4.3 Ratio
VLDL: 45 mg/dL — ABNORMAL HIGH (ref 0–40)

## 2013-01-26 MED ORDER — SUMATRIPTAN SUCCINATE 100 MG PO TABS
100.0000 mg | ORAL_TABLET | ORAL | Status: DC | PRN
Start: 2013-01-26 — End: 2014-02-19

## 2013-01-26 MED ORDER — LOSARTAN POTASSIUM 25 MG PO TABS: ORAL_TABLET | ORAL | Status: DC

## 2013-01-26 MED ORDER — METFORMIN HCL 500 MG PO TABS: ORAL_TABLET | ORAL | Status: DC

## 2013-01-26 MED ORDER — NORETHINDRONE-ETH ESTRADIOL 1-35 MG-MCG PO TABS: ORAL_TABLET | ORAL | Status: DC
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
## 2013-01-26 NOTE — Patient Instructions (Addendum)
It was a pleasure to see you today.    -Your diabetes is continuing to be well controlled with an A1C of 5.5, please keep with your current medications.   -I agree with your plan to see Dr Delton See again for therapy.   I have printed a prescription for Imitrex 100mg  tablets, you may take a half tablet as needed for migraine; maximum 2 tablets in 24hours.   I would like to see you back in February, or sooner if needed.
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
## 2013-01-26 NOTE — Addendum Note (Signed)
Addended by: Barbaraann Barthel on: 01/26/2013 05:12 PM   Modules accepted: Orders

## 2013-01-26 NOTE — Progress Notes (Signed)
Patient ID: Anne Daniels, female   DOB: Jul 31, 1975, 37 y.o.   MRN: [id number]  Anne Daniels is a 37 y.o. female who presents to [facility] today for follow up for DM.  DM: no problem with medications.  She has gained some weight recently and reports stress as a factor.  She reports her average sugars as being around 99.  She denies any numbness or tingling in her extremities.  Dysuria/Polyuria: has had burning and increased frequency starting on Monday.  She says both symptoms have decreased, but is interested in treatment for UTI.  Denies any fever.  Migraines: has been having an increase in frequency of these, 1-2 a week.  She reports vision changes, numbness in her face, and nausea with these episodes.  She takes propranolol 20mg  BID along with Imitrex for this.  Would like an increase in her Imitrex.  The following portions of the patient's history were reviewed and updated as appropriate: allergies, current medications, past medical history, family and social history, and problem list.  Patient is a nonsmoker.  Past Medical History  Diagnosis Date  . Diabetes mellitus without complication   . Migraines   . Syncope and collapse    ROS as above otherwise neg.  Patient denies chest pain, dyspnea, abdominal pain, diarrhea/constipation.     Medications reviewed. Current Outpatient Prescriptions  Medication Sig Dispense Refill  . losartan (COZAAR) 25 MG tablet take 1 tablet by mouth once daily  30 tablet  11  . metFORMIN (GLUCOPHAGE) 500 MG tablet take 1 tablet by mouth twice a day with food  180 tablet  2  . NORTREL 1/35, 28, tablet take 1 tablet by mouth once daily  28 tablet  3  . PARoxetine (PAXIL-CR) 25 MG 24 hr tablet take 1 tablet by mouth once daily  30 tablet  0  . propranolol (INDERAL) 20 MG tablet take 1 tablet by mouth twice a day  60 tablet  1  . gabapentin (NEURONTIN) 600 MG tablet Take 600 mg by mouth 3 (three) times daily.         No current facility-administered  medications for this visit.    Exam:  BP 139/78  Pulse 79  Ht 5\' 7"  (1.702 m)  Wt 256 lb (116.121 kg)  BMI 40.09 kg/m2 Gen: Well NAD HEENT: EOMI,  MMM Lungs: CTABL Nl WOB Heart: RRR no MRG Exts: Non edematous BL  LE, warm and well perfused. -monofilament sensation present bilaterally   Results for orders placed in visit on 01/26/13 (from the past 72 hour(s))  POCT GLYCOSYLATED HEMOGLOBIN (HGB A1C)     Status: None   Collection Time    01/26/13 10:03 AM      Result Value Range   Hemoglobin A1C 5.5    POCT URINALYSIS DIPSTICK     Status: Abnormal   Collection Time    01/26/13 10:03 AM      Result Value Range   Color, UA YELLOW     Clarity, UA CLEAR     Glucose, UA NEG     Bilirubin, UA NEG     Ketones, UA NEG     Spec Grav, UA >=1.030     Blood, UA SMALL     pH, UA 5.5     Protein, UA NEG     Urobilinogen, UA 0.2     Nitrite, UA NEG     Leukocytes, UA Negative    POCT UA - MICROSCOPIC ONLY     Status: Abnormal  Collection Time    01/26/13 10:03 AM      Result Value Range   WBC, Ur, HPF, POC 5-10     RBC, urine, microscopic RARE     Bacteria, U Microscopic 1+     Mucus, UA 2+     Epithelial cells, urine per micros >20       Assessment Ms. Ciccone is a 37 y.o. female here for follow-up for her DM.  A1C is 5.5 and patient continues to be well controlled on her metformin.  Patient also complains of now diminishing polyuria/dysuria.  UTI was suspected, however dipstick is unremarkable.  May have been a now resolved UTI or symptoms from her DM.  Patient is reporting increased migraines and will discuss increasing Imitrex.  Plan DM: -Well-controlled, continue current metformin. -Plan follow-up in 6 months  Polyuria/Dysuria: -Plan to just follow for now, discussed with patient and she is amenable to this.  Migraines:

## 2013-01-29 ENCOUNTER — Encounter: Payer: Self-pay | Admitting: Family Medicine

## 2013-03-10 ENCOUNTER — Other Ambulatory Visit: Payer: Self-pay | Admitting: Family Medicine

## 2013-03-19 ENCOUNTER — Emergency Department (INDEPENDENT_AMBULATORY_CARE_PROVIDER_SITE_OTHER)
Admission: EM | Admit: 2013-03-19 | Discharge: 2013-03-19 | Disposition: A | Discharge status: Home / Self Care | Payer: 59 | Source: Home / Self Care | Attending: Family Medicine | Admitting: Family Medicine

## 2013-03-19 ENCOUNTER — Encounter (HOSPITAL_COMMUNITY): Payer: Self-pay | Admitting: Emergency Medicine

## 2013-03-19 DIAGNOSIS — R69 Illness, unspecified: Principal | ICD-10-CM

## 2013-03-19 DIAGNOSIS — J111 Influenza due to unidentified influenza virus with other respiratory manifestations: Secondary | ICD-10-CM

## 2013-03-19 MED ORDER — ONDANSETRON HCL 4 MG PO TABS: 4.0000 mg | ORAL_TABLET | Freq: Four times a day (QID) | ORAL | Status: DC

## 2013-03-19 MED ORDER — ONDANSETRON 4 MG PO TBDP
ORAL_TABLET | ORAL | Status: AC
  Filled 2013-03-19: qty 2

## 2013-03-19 MED ORDER — ONDANSETRON 4 MG PO TBDP
8.0000 mg | ORAL_TABLET | Freq: Once | ORAL | Status: AC
  Administered 2013-03-19: 8 mg via ORAL

## 2013-03-19 NOTE — ED Notes (Signed)
C/o  Headache.  N/v/d.  Fatigue.  Sinus pressure.  Denies runny nose and post nasal drip.   Decrease in appetite.  Symptoms present since last Thursday.   Pt has tried otc meds with no relief.

## 2013-03-19 NOTE — ED Provider Notes (Signed)
CSN: [id number]     Arrival date & time 03/19/13  1501 History   First MD Initiated Contact with Patient 03/19/13 1616     Chief Complaint  Patient presents with  . Influenza   (Consider location/radiation/quality/duration/timing/severity/associated sxs/prior Treatment) Patient is a 38 y.o. female presenting with flu symptoms. The history is provided by the patient.  Influenza Presenting symptoms: cough, diarrhea, fatigue, fever, myalgias, nausea, rhinorrhea and vomiting   Severity:  Mild Onset quality:  Sudden Duration:  5 days Progression:  Unchanged Chronicity:  New Ineffective treatments:  OTC medications Associated symptoms: decreased appetite and nasal congestion   Risk factors: sick contacts     Past Medical History  Diagnosis Date  . Diabetes mellitus without complication   . Migraines   . Syncope and collapse    Past Surgical History  Procedure Laterality Date  . Knee surgery    . Wisdom tooth extraction     Family History  Problem Relation Age of Onset  . Diabetes Mother   . Hyperlipidemia Mother   . Hypertension Mother   . Hypertension Father   . Hyperlipidemia Brother   . Hypertension Brother   . Sudden death Neg Hx   . Heart attack Neg Hx    History  Substance Use Topics  . Smoking status: Never Smoker   . Smokeless tobacco: Never Used  . Alcohol Use: No   OB History   Grav Para Term Preterm Abortions TAB SAB Ect Mult Living                 Review of Systems  Constitutional: Positive for fever, appetite change, fatigue and decreased appetite.  HENT: Positive for congestion, postnasal drip and rhinorrhea.   Respiratory: Positive for cough.   Gastrointestinal: Positive for nausea, vomiting and diarrhea.  Musculoskeletal: Positive for myalgias.    Allergies  Penicillins  Home Medications   Current Outpatient Rx  Name  Route  Sig  Dispense  Refill  . losartan (COZAAR) 25 MG tablet      take 1 tablet by mouth once daily   30 tablet    11   . metFORMIN (GLUCOPHAGE) 500 MG tablet      take 1 tablet by mouth twice a day with food   60 tablet   11   . norethindrone-ethinyl estradiol 1/35 (NORTREL 1/35, 28,) tablet      take 1 tablet by mouth once daily   28 tablet   6   . PARoxetine (PAXIL-CR) 25 MG 24 hr tablet      take 1 tablet by mouth once daily   30 tablet   0   . propranolol (INDERAL) 20 MG tablet      take 1 tablet by mouth twice a day   60 tablet   1   . gabapentin (NEURONTIN) 600 MG tablet   Oral   Take 600 mg by mouth 3 (three) times daily.           . ondansetron (ZOFRAN) 4 MG tablet   Oral   Take 1 tablet (4 mg total) by mouth every 6 (six) hours. Prn n/v   8 tablet   0   . SUMAtriptan (IMITREX) 100 MG tablet   Oral   Take 1 tablet (100 mg total) by mouth every 2 (two) hours as needed for migraine or headache. May repeat in 2 hours if headache persists or recurs.   10 tablet   3    BP 135/84  Pulse 86  Temp(Src) 98.3 F (36.8 C) (Oral)  Resp 20  SpO2 100%  LMP 03/19/2013 Physical Exam  Nursing note and vitals reviewed. Constitutional: She is oriented to person, place, and time. She appears well-developed and well-nourished. No distress.  HENT:  Head: Normocephalic and atraumatic.  Right Ear: External ear normal.  Left Ear: External ear normal.  Mouth/Throat: Oropharynx is clear and moist.  Eyes: Pupils are equal, round, and reactive to light.  Neck: Normal range of motion. Neck supple.  Cardiovascular: Normal rate.   Pulmonary/Chest: Effort normal and breath sounds normal.  Abdominal: Soft. Bowel sounds are normal. She exhibits no distension and no mass. There is no tenderness. There is no rebound and no guarding.  Lymphadenopathy:    She has no cervical adenopathy.  Neurological: She is alert and oriented to person, place, and time.  Skin: Skin is warm and dry.    ED Course  Procedures (including critical care time) Labs Review Labs Reviewed - No data to  display Imaging Review No results found.    MDM   1. Influenza-like illness       Billy Fischer, MD 03/19/13 2101

## 2013-03-19 NOTE — Discharge Instructions (Signed)
Clear liquid , bland diet tonight as tolerated, advance on tues as improved, use medicine as needed, imodium for diarrhea, return or see your doctor if any problems. °

## 2013-04-09 ENCOUNTER — Other Ambulatory Visit: Payer: Self-pay | Admitting: Family Medicine

## 2013-04-11 ENCOUNTER — Telehealth: Payer: Self-pay | Admitting: *Deleted

## 2013-04-11 NOTE — Telephone Encounter (Signed)
Pt called needing an appt today for sore throat and fever. Pt infomed that she could be seen in clinic this afternoon on a walk-in (overflow) but have to wait.  Pt told she could go to urgent care to be seen.  Pt opt for urgent care.  Derl Barrow, RN

## 2013-04-13 ENCOUNTER — Encounter: Payer: Self-pay | Admitting: Family Medicine

## 2013-04-13 ENCOUNTER — Ambulatory Visit (INDEPENDENT_AMBULATORY_CARE_PROVIDER_SITE_OTHER): Payer: 59 | Admitting: Family Medicine

## 2013-04-13 VITALS — BP 130/80 | HR 80 | Temp 98.3°F | Ht 67.0 in | Wt 254.0 lb

## 2013-04-13 DIAGNOSIS — H6690 Otitis media, unspecified, unspecified ear: Secondary | ICD-10-CM

## 2013-04-13 DIAGNOSIS — H6691 Otitis media, unspecified, right ear: Secondary | ICD-10-CM

## 2013-04-13 MED ORDER — BENZONATATE 100 MG PO CAPS: 100.0000 mg | ORAL_CAPSULE | Freq: Two times a day (BID) | ORAL | Status: DC | PRN

## 2013-04-13 MED ORDER — AZITHROMYCIN 250 MG PO TABS: ORAL_TABLET | ORAL | Status: DC

## 2013-04-13 NOTE — Progress Notes (Signed)
Subjective:     Patient ID: Anne Daniels, female   DOB: [birth year], 38 y.o.   MRN: [id number] Attending Physician Note;  Patient seen and examined by me in conjunction with medical student Anne Daniels, MS3 [facility], and I agree with his assessment and plan.  My edits and addenda are in bold type. JB HPI:  Anne Daniels is a 38 year old female with a history of OSA, T2DM that presents with a 4 day history of sore throat, productive cough, sinus pressure and drainage, and R ear pain.  She reports a "low grade" fever at home, with a high temp of 99.2.  She has tried taking Dayquil and Nightquil, with no relief of symptoms.  Her symptoms are getting progressively worse and are at their worst today.  She reports four to five days of worsening R ear pain, cough, frontal and maxillary sinus pressure.  Cough is a 'wet cough'.  No dyspnea or chest pain. JB  Her ear pain feels internal to her.  She has had a history of uncomplicated otitis media, with the last infection about 20 years ago.  This ear pain feels similar to those episodes.  She denies external ear pain, hearing loss, ear drainage, and tinnitus. Reviewed and agree. JB  The productive cough is significant for producing modest amounts of yellow sputum on a consistent basis.  The cough goes along with a runny nose that produces green/yellow and crusty discharge.  She feels sinus pressure in her orbital region that has been unrelenting since Tuesday.   She denies chest pain and shortness of breath.   The patient has an allergic to Penicillin.  She characterizes the allergy as a rash, "blowing up like a balloon," and feeling short of breath. Reviewed patient allergies and agree. JB   Review of Systems As noted in the HPI.  Also denies nausea, vomiting, abdominal pain, diarrhea, headaches, changes in vision.  Reviewed and agree. She denies recent objective fever, Tmax 99.60F at home. JB    Objective:   Physical Exam HEENT:  L ear--normal translucent  TM; R ear--slight erythema and bulging of TM;  Palpable periaucular and anterior nontender LAD; no erthyema or swelling of nasal turbinates; no erythema or exudates on throat CV-normal heart sounds; regular rate and rhythm; n m/r/g Pulm--clear breath sounds in all lung fields, no rales, crackles, wheezes Abd--normoactive bowel sounds, no superficial or deep tenderness on palpation   Physical Exam: Generally well appearing, no apparent distress.  HEENT neck supple, anterior cervical adenopathy is present.  R TM is injected at periphery, with mild dulling of cone of light.  No tenderness to palpate tragus/ pinna.  L ear TM is clear, good cone of light.  Mild tenderness to palpation over maxillary and frontal sinus tenderness bilaterally.  COR regular S1S2, no extra sounds PULM Clear bilaterally, no rales or wheezes. JB    Assessment:     Anne Daniels is 38 year old female with a history of OSA, T2DM that presents with a 4 day history of sore throat, productive cough, sinus pressure and drainage, and R ear pain.  Given the lack of exudates and erythema of the throat, GAS or EBV infection is very unlikely.  Her symptoms are either a case of viral/bacterial sinusitis or otitis media.  The findings on otoscopic exam make otitis media more likely, which has then extended to cause sinus pain, pressure and drainage.  She is allergic to Penicillin, so a cephalosporin or macrolide should be  considered as treatment.    Plan:     R otitis media: Azythromycin    See Problem List for more complete plan.  JB

## 2013-04-13 NOTE — Patient Instructions (Signed)
It was a pleasure to see you.  I believe you have a Right ear infection.   Azithromycin 250mg  tablets, take 2 tablets today (together) then 1 tablet/day for the next 4 days.   Benzonatate 100mg  perles, take 1 perle every 4 to 6 hours as needed for cough.   Mucinex 600mg  tablets, 1 tablet every 12 hours to loosen mucus/phlegm

## 2013-04-13 NOTE — Assessment & Plan Note (Signed)
Patient with R TM erythema, worsening ear pain.  Reported allergy to PCN includes "swelling and trouble breathing".  Plan for macrolide treatment, azithro 500mg  day one, followed by 250mg  daily for 4 days.  Guaifenesin for mucolysis, benzonatate for cough suppression mostly at night.  May use ibuprofen or acetaminophen for malaise and ear pain.  Follow up if not improving in the coming 3-5 days, or if worsening.  JB

## 2013-05-08 ENCOUNTER — Other Ambulatory Visit: Payer: Self-pay | Admitting: Family Medicine

## 2013-07-08 ENCOUNTER — Other Ambulatory Visit: Payer: Self-pay | Admitting: Family Medicine

## 2013-08-09 ENCOUNTER — Encounter (HOSPITAL_COMMUNITY): Payer: Self-pay | Admitting: Emergency Medicine

## 2013-08-09 ENCOUNTER — Emergency Department (HOSPITAL_COMMUNITY)
Admission: EM | Admit: 2013-08-09 | Discharge: 2013-08-09 | Disposition: A | Discharge status: Home / Self Care | Payer: 59 | Source: Home / Self Care | Attending: Family Medicine | Admitting: Family Medicine

## 2013-08-09 DIAGNOSIS — J0100 Acute maxillary sinusitis, unspecified: Secondary | ICD-10-CM

## 2013-08-09 DIAGNOSIS — A088 Other specified intestinal infections: Secondary | ICD-10-CM

## 2013-08-09 DIAGNOSIS — A084 Viral intestinal infection, unspecified: Secondary | ICD-10-CM

## 2013-08-09 LAB — POCT URINALYSIS DIP (DEVICE)
BILIRUBIN URINE: NEGATIVE
Glucose, UA: NEGATIVE mg/dL
KETONES UR: NEGATIVE mg/dL
LEUKOCYTES UA: NEGATIVE
Nitrite: NEGATIVE
PROTEIN: 30 mg/dL — AB
Urobilinogen, UA: 0.2 mg/dL (ref 0.0–1.0)
pH: 5.5 (ref 5.0–8.0)

## 2013-08-09 LAB — POCT RAPID STREP A: STREPTOCOCCUS, GROUP A SCREEN (DIRECT): NEGATIVE

## 2013-08-09 LAB — POCT PREGNANCY, URINE: Preg Test, Ur: NEGATIVE

## 2013-08-09 MED ORDER — PREDNISONE 10 MG PO TABS: 30.0000 mg | ORAL_TABLET | Freq: Every day | ORAL | Status: DC

## 2013-08-09 MED ORDER — ONDANSETRON HCL 8 MG PO TABS: 8.0000 mg | ORAL_TABLET | Freq: Three times a day (TID) | ORAL | Status: DC | PRN

## 2013-08-09 MED ORDER — IPRATROPIUM BROMIDE 0.06 % NA SOLN: 2.0000 | Freq: Four times a day (QID) | NASAL | Status: DC

## 2013-08-09 MED ORDER — ONDANSETRON 4 MG PO TBDP
ORAL_TABLET | ORAL | Status: AC
  Filled 2013-08-09: qty 2

## 2013-08-09 MED ORDER — ONDANSETRON 4 MG PO TBDP
8.0000 mg | ORAL_TABLET | Freq: Once | ORAL | Status: AC
  Administered 2013-08-09: 8 mg via ORAL

## 2013-08-09 NOTE — ED Provider Notes (Signed)
Anne Daniels is a 38 y.o. female who presents to Urgent Care today for fever left facial pain and vomiting. Symptoms present for the last several days. Patient has had 2 episodes of vomiting this morning. She denies any blood in the stool or vomit. She denies any diarrhea or abdominal pain. She notes mild subjective fevers. She also notes nasal congestion runny nose and cough. She's tried some over-the-counter medications which helped only a little. She feels well otherwise.  Patient is currently menstruating. Past Medical History  Diagnosis Date  . Diabetes mellitus without complication   . Migraines   . Syncope and collapse    History  Substance Use Topics  . Smoking status: Never Smoker   . Smokeless tobacco: Never Used  . Alcohol Use: No   ROS as above Medications: No current facility-administered medications for this encounter.   Current Outpatient Prescriptions  Medication Sig Dispense Refill  . azithromycin (ZITHROMAX) 250 MG tablet Take 2 tablets by mouth on day one, then 1 tablet daily for the following 4 days.  6 tablet  0  . benzonatate (TESSALON) 100 MG capsule Take 1 capsule (100 mg total) by mouth 2 (two) times daily as needed for cough.  20 capsule  0  . gabapentin (NEURONTIN) 600 MG tablet Take 600 mg by mouth 3 (three) times daily.        Marland Kitchen ipratropium (ATROVENT) 0.06 % nasal spray Place 2 sprays into both nostrils 4 (four) times daily.  15 mL  1  . losartan (COZAAR) 25 MG tablet take 1 tablet by mouth once daily  30 tablet  11  . metFORMIN (GLUCOPHAGE) 500 MG tablet take 1 tablet by mouth twice a day with food  60 tablet  11  . norethindrone-ethinyl estradiol 1/35 (NORTREL 1/35, 28,) tablet take 1 tablet by mouth once daily  28 tablet  6  . ondansetron (ZOFRAN) 4 MG tablet Take 1 tablet (4 mg total) by mouth every 6 (six) hours. Prn n/v  8 tablet  0  . ondansetron (ZOFRAN) 8 MG tablet Take 1 tablet (8 mg total) by mouth every 8 (eight) hours as needed for nausea or  vomiting.  20 tablet  0  . PARoxetine (PAXIL-CR) 25 MG 24 hr tablet take 1 tablet by mouth once daily  30 tablet  0  . PARoxetine (PAXIL-CR) 25 MG 24 hr tablet take 1 tablet by mouth once daily  30 tablet  4  . predniSONE (DELTASONE) 10 MG tablet Take 3 tablets (30 mg total) by mouth daily.  15 tablet  0  . propranolol (INDERAL) 20 MG tablet take 1 tablet by mouth twice a day  60 tablet  1  . SUMAtriptan (IMITREX) 100 MG tablet Take 1 tablet (100 mg total) by mouth every 2 (two) hours as needed for migraine or headache. May repeat in 2 hours if headache persists or recurs.  10 tablet  3    Exam:  BP 134/92  Pulse 68  Temp(Src) 98.1 F (36.7 C) (Oral)  Resp 18  SpO2 96%  LMP 08/06/2013 Gen: Well NAD obese HEENT: EOMI,  MMM left maxilla sinus is very mildly tender to palpation. Right is normal. Tympanic membranes are normal appearing bilaterally. Posterior pharynx is significant cobblestoning. Clear nasal discharge present. Lungs: Normal work of breathing. CTABL Heart: RRR no MRG Abd: NABS, Soft. NT, ND Exts: Brisk capillary refill, warm and well perfused.   Patient was given 8 mg of oral Zofran and felt better. She is  able to tolerate fluids prior to discharge.  Results for orders placed during the hospital encounter of 08/09/13 (from the past 24 hour(s))  POCT URINALYSIS DIP (DEVICE)     Status: Abnormal   Collection Time    08/09/13  2:12 PM      Result Value Ref Range   Glucose, UA NEGATIVE  NEGATIVE mg/dL   Bilirubin Urine NEGATIVE  NEGATIVE   Ketones, ur NEGATIVE  NEGATIVE mg/dL   Specific Gravity, Urine >=1.030  1.005 - 1.030   Hgb urine dipstick LARGE (*) NEGATIVE   pH 5.5  5.0 - 8.0   Protein, ur 30 (*) NEGATIVE mg/dL   Urobilinogen, UA 0.2  0.0 - 1.0 mg/dL   Nitrite NEGATIVE  NEGATIVE   Leukocytes, UA NEGATIVE  NEGATIVE  POCT RAPID STREP A ([facility] ONLY)     Status: None   Collection Time    08/09/13  2:22 PM      Result Value Ref Range   Streptococcus, Group A  Screen (Direct) NEGATIVE  NEGATIVE  POCT PREGNANCY, URINE     Status: None   Collection Time    08/09/13  2:22 PM      Result Value Ref Range   Preg Test, Ur NEGATIVE  NEGATIVE   No results found.  Assessment and Plan: 38 y.o. female with sinusitis. Likely viral. Plan to treat with prednisone and Atrovent nasal spray. Zofran for vomiting as needed.  Discussed warning signs or symptoms. Please see discharge instructions. Patient expresses understanding.    Gregor Hams, MD 08/09/13 1556

## 2013-08-09 NOTE — ED Notes (Signed)
C/o fever and vomiting States today she did vomit twice States she has been vomiting so much she broken blood vessels in her face

## 2013-08-09 NOTE — Discharge Instructions (Signed)
Thank you for coming in today. Call or go to the emergency room if you get worse, have trouble breathing, have chest pains, or palpitations.  Take the medications as directed.  If your belly pain worsens, or you have high fever, bad vomiting, blood in your stool or black tarry stool go to the Emergency Room.   Sinusitis Sinusitis is redness, soreness, and swelling (inflammation) of the paranasal sinuses. Paranasal sinuses are air pockets within the bones of your face (beneath the eyes, the middle of the forehead, or above the eyes). In healthy paranasal sinuses, mucus is able to drain out, and air is able to circulate through them by way of your nose. However, when your paranasal sinuses are inflamed, mucus and air can become trapped. This can allow bacteria and other germs to grow and cause infection. Sinusitis can develop quickly and last only a short time (acute) or continue over a long period (chronic). Sinusitis that lasts for more than 12 weeks is considered chronic.  CAUSES  Causes of sinusitis include:  Allergies.  Structural abnormalities, such as displacement of the cartilage that separates your nostrils (deviated septum), which can decrease the air flow through your nose and sinuses and affect sinus drainage.  Functional abnormalities, such as when the small hairs (cilia) that line your sinuses and help remove mucus do not work properly or are not present. SYMPTOMS  Symptoms of acute and chronic sinusitis are the same. The primary symptoms are pain and pressure around the affected sinuses. Other symptoms include:  Upper toothache.  Earache.  Headache.  Bad breath.  Decreased sense of smell and taste.  A cough, which worsens when you are lying flat.  Fatigue.  Fever.  Thick drainage from your nose, which often is green and may contain pus (purulent).  Swelling and warmth over the affected sinuses. DIAGNOSIS  Your caregiver will perform a physical exam. During the  exam, your caregiver may:  Look in your nose for signs of abnormal growths in your nostrils (nasal polyps).  Tap over the affected sinus to check for signs of infection.  View the inside of your sinuses (endoscopy) with a special imaging device with a light attached (endoscope), which is inserted into your sinuses. If your caregiver suspects that you have chronic sinusitis, one or more of the following tests may be recommended:  Allergy tests.  Nasal culture--A sample of mucus is taken from your nose and sent to a lab and screened for bacteria.  Nasal cytology--A sample of mucus is taken from your nose and examined by your caregiver to determine if your sinusitis is related to an allergy. TREATMENT  Most cases of acute sinusitis are related to a viral infection and will resolve on their own within 10 days. Sometimes medicines are prescribed to help relieve symptoms (pain medicine, decongestants, nasal steroid sprays, or saline sprays).  However, for sinusitis related to a bacterial infection, your caregiver will prescribe antibiotic medicines. These are medicines that will help kill the bacteria causing the infection.  Rarely, sinusitis is caused by a fungal infection. In theses cases, your caregiver will prescribe antifungal medicine. For some cases of chronic sinusitis, surgery is needed. Generally, these are cases in which sinusitis recurs more than 3 times per year, despite other treatments. HOME CARE INSTRUCTIONS   Drink plenty of water. Water helps thin the mucus so your sinuses can drain more easily.  Use a humidifier.  Inhale steam 3 to 4 times a day (for example, sit in the bathroom  with the shower running).  Apply a warm, moist washcloth to your face 3 to 4 times a day, or as directed by your caregiver.  Use saline nasal sprays to help moisten and clean your sinuses.  Take over-the-counter or prescription medicines for pain, discomfort, or fever only as directed by your  caregiver. SEEK IMMEDIATE MEDICAL CARE IF:  You have increasing pain or severe headaches.  You have nausea, vomiting, or drowsiness.  You have swelling around your face.  You have vision problems.  You have a stiff neck.  You have difficulty breathing. MAKE SURE YOU:   Understand these instructions.  Will watch your condition.  Will get help right away if you are not doing well or get worse. Document Released: 02/01/2005 Document Revised: 04/26/2011 Document Reviewed: 02/16/2011 [facility] Patient Information 2015 [place], [place]. This information is not intended to replace advice given to you by your health care provider. Make sure you discuss any questions you have with your health care provider.    Viral Gastroenteritis Viral gastroenteritis is also known as stomach flu. This condition affects the stomach and intestinal tract. It can cause sudden diarrhea and vomiting. The illness typically lasts 3 to 8 days. Most people develop an immune response that eventually gets rid of the virus. While this natural response develops, the virus can make you quite ill. CAUSES  Many different viruses can cause gastroenteritis, such as rotavirus or noroviruses. You can catch one of these viruses by consuming contaminated food or water. You may also catch a virus by sharing utensils or other personal items with an infected person or by touching a contaminated surface. SYMPTOMS  The most common symptoms are diarrhea and vomiting. These problems can cause a severe loss of body fluids (dehydration) and a body salt (electrolyte) imbalance. Other symptoms may include:  Fever.  Headache.  Fatigue.  Abdominal pain. DIAGNOSIS  Your caregiver can usually diagnose viral gastroenteritis based on your symptoms and a physical exam. A stool sample may also be taken to test for the presence of viruses or other infections. TREATMENT  This illness typically goes away on its own. Treatments are aimed at  rehydration. The most serious cases of viral gastroenteritis involve vomiting so severely that you are not able to keep fluids down. In these cases, fluids must be given through an intravenous line (IV). HOME CARE INSTRUCTIONS   Drink enough fluids to keep your urine clear or pale yellow. Drink small amounts of fluids frequently and increase the amounts as tolerated.  Ask your caregiver for specific rehydration instructions.  Avoid:  Foods high in sugar.  Alcohol.  Carbonated drinks.  Tobacco.  Juice.  Caffeine drinks.  Extremely hot or cold fluids.  Fatty, greasy foods.  Too much intake of anything at one time.  Dairy products until 24 to 48 hours after diarrhea stops.  You may consume probiotics. Probiotics are active cultures of beneficial bacteria. They may lessen the amount and number of diarrheal stools in adults. Probiotics can be found in yogurt with active cultures and in supplements.  Wash your hands well to avoid spreading the virus.  Only take over-the-counter or prescription medicines for pain, discomfort, or fever as directed by your caregiver. Do not give aspirin to children. Antidiarrheal medicines are not recommended.  Ask your caregiver if you should continue to take your regular prescribed and over-the-counter medicines.  Keep all follow-up appointments as directed by your caregiver. SEEK IMMEDIATE MEDICAL CARE IF:   You are unable to keep fluids  down.  You do not urinate at least once every 6 to 8 hours.  You develop shortness of breath.  You notice blood in your stool or vomit. This may look like coffee grounds.  You have abdominal pain that increases or is concentrated in one small area (localized).  You have persistent vomiting or diarrhea.  You have a fever.  The patient is a child younger than 3 months, and he or she has a fever.  The patient is a child older than 3 months, and he or she has a fever and persistent symptoms.  The  patient is a child older than 3 months, and he or she has a fever and symptoms suddenly get worse.  The patient is a baby, and he or she has no tears when crying. MAKE SURE YOU:   Understand these instructions.  Will watch your condition.  Will get help right away if you are not doing well or get worse. Document Released: 02/01/2005 Document Revised: 04/26/2011 Document Reviewed: 11/18/2010 [facility] Patient Information 2015 [place], [place]. This information is not intended to replace advice given to you by your health care provider. Make sure you discuss any questions you have with your health care provider.

## 2013-08-11 LAB — CULTURE, GROUP A STREP

## 2013-09-07 ENCOUNTER — Other Ambulatory Visit: Payer: Self-pay | Admitting: Family Medicine

## 2013-10-30 ENCOUNTER — Other Ambulatory Visit: Payer: Self-pay | Admitting: Family Medicine

## 2013-11-01 ENCOUNTER — Other Ambulatory Visit: Payer: Self-pay | Admitting: Family Medicine

## 2013-12-27 ENCOUNTER — Other Ambulatory Visit: Payer: Self-pay | Admitting: Family Medicine

## 2014-01-04 ENCOUNTER — Ambulatory Visit (HOSPITAL_COMMUNITY)
Admission: RE | Admit: 2014-01-04 | Discharge: 2014-01-04 | Disposition: A | Discharge status: Home / Self Care | Payer: 59 | Source: Ambulatory Visit | Attending: Family Medicine | Admitting: Family Medicine

## 2014-01-04 ENCOUNTER — Ambulatory Visit (INDEPENDENT_AMBULATORY_CARE_PROVIDER_SITE_OTHER): Payer: 59 | Admitting: *Deleted

## 2014-01-04 ENCOUNTER — Ambulatory Visit (INDEPENDENT_AMBULATORY_CARE_PROVIDER_SITE_OTHER): Payer: 59 | Admitting: Family Medicine

## 2014-01-04 ENCOUNTER — Encounter: Payer: Self-pay | Admitting: Family Medicine

## 2014-01-04 VITALS — Temp 98.4°F | Ht 67.0 in | Wt 256.0 lb

## 2014-01-04 DIAGNOSIS — R072 Precordial pain: Secondary | ICD-10-CM

## 2014-01-04 DIAGNOSIS — N939 Abnormal uterine and vaginal bleeding, unspecified: Secondary | ICD-10-CM

## 2014-01-04 DIAGNOSIS — R001 Bradycardia, unspecified: Secondary | ICD-10-CM | POA: Insufficient documentation

## 2014-01-04 DIAGNOSIS — N912 Amenorrhea, unspecified: Secondary | ICD-10-CM

## 2014-01-04 DIAGNOSIS — R0789 Other chest pain: Secondary | ICD-10-CM | POA: Diagnosis not present

## 2014-01-04 DIAGNOSIS — Z23 Encounter for immunization: Secondary | ICD-10-CM

## 2014-01-04 DIAGNOSIS — E119 Type 2 diabetes mellitus without complications: Secondary | ICD-10-CM

## 2014-01-04 DIAGNOSIS — R1011 Right upper quadrant pain: Secondary | ICD-10-CM | POA: Insufficient documentation

## 2014-01-04 LAB — POCT URINE PREGNANCY: PREG TEST UR: NEGATIVE

## 2014-01-04 LAB — POCT GLYCOSYLATED HEMOGLOBIN (HGB A1C): Hemoglobin A1C: 5.6

## 2014-01-04 MED ORDER — PAROXETINE HCL ER 37.5 MG PO TB24: 37.5000 mg | ORAL_TABLET | Freq: Every day | ORAL | Status: DC

## 2014-01-04 NOTE — Assessment & Plan Note (Signed)
Patient describes midchest pain, sharp "like I was kicked in the chest", lasting for a matter of minutes, without provoking factors. Associated with nausea but no vomiting, no dyspnea or cough. No diaphoresis. Occurs a couple of times a week, she believes may be attributable in some degree to stress. She is concerned about the possibility of angina.  ECG in office today unremarkable. Exam raises concern for gallbladder disease (positive Murphys sign, pain localized to epigastrium/RUQ. No intraabdominal surgical history. For initial evaluation with CMet, CBC, and abdominal US. Would consider EST given intermediate risk with history of CAD in family, as well as DM history.

## 2014-01-04 NOTE — Progress Notes (Signed)
   Subjective:    Patient ID: Anne Daniels, female    DOB: [birth year], 38 y.o.   MRN: [id number]  HPI  [place] presents today for follow up visit for DM, as well as several complaints:  1. She reports marked increases in stress in the past several months.  Her job at [employer] was threatened as the company reorganized, she was not laid off. Has been having some economic stressors, as well as other stressors in her family.  2. Has had worsening migraine headaches that seem to occur weekly or twice weekly, used to occur one or two times a month. Takes Propranolol for prophylaxis, Imitrex helps as abortive therapy.  3. Trouble with sleep. Goes to bed 2am and wakes up 7am on workdays, 10am on days off. Husband works third shift and rarely co-sleeps (two nights a week on average).  She does not notice a change in her sleep patterns whether he is there or not. Reads in bed on tablet; TV is in living room only. Takes diphenhydramine 50mg  at night, not helping with sleep. No alcohol or other substances to help with sleep.  4. Has been having worsening hot flashes, feeling that she is chilled.  No fevers, no sweats. Had six weeks between last two menses (LMP 11/11-15/2015). Strong family history of early menopause in 73s, including mother, two maternal aunts, maternal grandmother.  5. Gets sudden-onset mid-chest pain, "like getting kicked", that comes on at random times, lasts for 1-2 minutes. Has been happening about twice weekly, for 6-9 months. Associated nausea, no diaphoresis. Does have random vomiting that she cannot associate with the chest pain. May feel her heart "skip a beat" on occasion, but not associated with the chest pain. No fevers or chills, no cough or sputum production. She is a non-smoker.   Family Hx; Both parents with PCI placement for CAD.   Social Hx; reports poor eating habits lately due to stress and working without break to eat. "taking poor care of myself". Nonsmoker. Rare  alcohol use.    Review of Systems     Objective:   Physical Exam Generally well appearing, no apparent distress HEENT Neck supple, no cervical adenopthy. No frontal or maxillary sinus tenderness. EOMI. Conjunctivae clear and without pallor. Thyroid supple, no nodularity.  COR Regular S1S2, no extra sounds. PULM Clear bilaterally, no rales or wheezes.  ABD Soft, audible bowel sounds. POSITIVE MURPHYS SIGN WITH RUQ PALPATION DURING INHALATION. No masses noted.        Assessment & Plan:

## 2014-01-04 NOTE — Assessment & Plan Note (Signed)
Patient with irregular menses despite taking OCPs. Her LMP November 11-15, 2015 was the first menses in 6 weeks. Had negative home pregnancy test. Repeat test today in office negative. Associated with worsening hot flashes and chills. Strong family history in mother, maternal grandmother and aunts of menopause/ovarian failure in their 38s. Check FSH today as well as TSH.

## 2014-01-04 NOTE — Progress Notes (Signed)
Subjective:     Patient ID: Anne Daniels, female   DOB: 24-May-1975, 38 y.o.   MRN: [id number]  HPI Anne Daniels presents to clinic today with her husband for a physical and to f/u on her diabetes and address several acute problems. Last 3 months have been exceptionally stressful. Definite anxiety related to medical problems.   - DM: BS's 98-102. Does get hypoglycemic sometimes, but related to not eating enough. Takes only metformin. Due for A1c today. Has not been able to lose weight.   - Irregular periods: Last menstrual was a month a half late. Started last Wednesday and ended Sunday. Had exceptionally heavy period the month prior. Is sexually active. Does not use condoms. Took home pregnancy test and was negative. Wants in office test today. Early menopause runs in family--mother went through menopause at 94. Currently taking Ortho-Novum. Has not missed any pills, takes at same time every day. Has been having hot flashes for about 3 years. Also at times has cold flashes, which happen more often than the hot flashes. More trouble sleeping at night than usual, wakes up more often than used to. Feels more moody and irritable than normal lately. More easily angered than normal, feels like this is not her.   - Migraines: more frequent than usual. 2-3 per week. Has not had frequency like this in 10 years. Has been more frequent for about 6 months. Takes propranolol BID for prevention. Takes Excedrin migraine when they come on, or caffeine. Out of imitrex.   - Low back pain: constant for past 6 months. Located over sacrum. Very aching. Has changed shoes bc works on concrete. Wears Danskos now and has helped some. Takes aspirin based pain reliever helps some. Advil does not help. Has tried heating pad, helped some. On feet 8-9 hrs per day. Does not get exercise outside of that. Weather, rainy in particular, makes it worse. Has not had imaging of it prior.   - Bilateral numbness in hands: gets pins and  needles and numbness in both hands, most often at night and first thing in the morning. 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 Has been going on for at least a year. No injury or trauma to neck. No weakness associated.   - Chest pain: going for about 9 months, worse in last month a half. Random, not associated with exertion, often comes on at rest. Described as burning, tight, sharp, comes on all of a sudden, not associated with sob, located in center of chest. Not worse at night or when laying flat. Heart palpitations, once or twice a week, been going on a couple years, sob when they come on started recently. No hx of HTN. No FHx of MI or stroke. Mother had stents (3). Father also had one stent.     Review of Systems No fevers or chills or illnesses. No polyuria, polydypsia. Diarrhea present but related to IBS, mostly happening when stressed.       Objective:   Physical Exam     Assessment:        Plan:

## 2014-01-05 LAB — COMPREHENSIVE METABOLIC PANEL
ALT: 18 U/L (ref 0–35)
AST: 24 U/L (ref 0–37)
Albumin: 4 g/dL (ref 3.5–5.2)
Alkaline Phosphatase: 59 U/L (ref 39–117)
BUN: 10 mg/dL (ref 6–23)
CALCIUM: 9.1 mg/dL (ref 8.4–10.5)
CHLORIDE: 101 meq/L (ref 96–112)
CO2: 23 mEq/L (ref 19–32)
CREATININE: 0.89 mg/dL (ref 0.50–1.10)
Glucose, Bld: 105 mg/dL — ABNORMAL HIGH (ref 70–99)
Potassium: 4.4 mEq/L (ref 3.5–5.3)
SODIUM: 138 meq/L (ref 135–145)
TOTAL PROTEIN: 6.6 g/dL (ref 6.0–8.3)
Total Bilirubin: 1.6 mg/dL — ABNORMAL HIGH (ref 0.2–1.2)

## 2014-01-05 LAB — CBC
HCT: 44.1 % (ref 36.0–46.0)
Hemoglobin: 15 g/dL (ref 12.0–15.0)
MCH: 31.4 pg (ref 26.0–34.0)
MCHC: 34 g/dL (ref 30.0–36.0)
MCV: 92.5 fL (ref 78.0–100.0)
MPV: 10.2 fL (ref 9.4–12.4)
Platelets: 251 10*3/uL (ref 150–400)
RBC: 4.77 MIL/uL (ref 3.87–5.11)
RDW: 12.6 % (ref 11.5–15.5)
WBC: 5.8 10*3/uL (ref 4.0–10.5)

## 2014-01-05 LAB — LIPID PANEL
CHOL/HDL RATIO: 3.7 ratio
Cholesterol: 158 mg/dL (ref 0–200)
HDL: 43 mg/dL (ref >39)
LDL CALC: 69 mg/dL (ref 0–99)
Triglycerides: 229 mg/dL — ABNORMAL HIGH (ref <150)
VLDL: 46 mg/dL — AB (ref 0–40)

## 2014-01-05 LAB — FOLLICLE STIMULATING HORMONE: FSH: 6.2 m[IU]/mL

## 2014-01-05 LAB — TSH: TSH: 2.701 u[IU]/mL (ref 0.350–4.500)

## 2014-01-07 ENCOUNTER — Encounter: Payer: Self-pay | Admitting: Family Medicine

## 2014-01-07 ENCOUNTER — Telehealth: Payer: Self-pay | Admitting: Family Medicine

## 2014-01-07 NOTE — Telephone Encounter (Signed)
Attempted to call patient with lab results, which are unremarkable.  Left voice message for her; letter with results mailed. JB

## 2014-01-08 NOTE — Telephone Encounter (Signed)
Pt called back and was informed of message.  Appt made for 01/22/14 for f/u appt. Ineta Sinning, Salome Spotted

## 2014-01-09 ENCOUNTER — Ambulatory Visit (HOSPITAL_COMMUNITY)
Admission: RE | Admit: 2014-01-09 | Discharge: 2014-01-09 | Disposition: A | Discharge status: Home / Self Care | Payer: 59 | Source: Ambulatory Visit | Attending: Family Medicine | Admitting: Family Medicine

## 2014-01-09 ENCOUNTER — Telehealth: Payer: Self-pay | Admitting: Family Medicine

## 2014-01-09 DIAGNOSIS — R1011 Right upper quadrant pain: Secondary | ICD-10-CM | POA: Diagnosis present

## 2014-01-09 NOTE — Telephone Encounter (Signed)
Called patient's mobile number to report the results of the abdominal US, which did not show evidence of gallstones or gallbladder obstruction. Left voice message, to discuss with patient re options for further workup.  Dalbert Mayotte, MD

## 2014-01-13 ENCOUNTER — Emergency Department (HOSPITAL_COMMUNITY): Payer: 59

## 2014-01-13 ENCOUNTER — Encounter (HOSPITAL_COMMUNITY): Payer: Self-pay | Admitting: *Deleted

## 2014-01-13 ENCOUNTER — Emergency Department (HOSPITAL_COMMUNITY)
Admission: EM | Admit: 2014-01-13 | Discharge: 2014-01-13 | Disposition: A | Discharge status: Home / Self Care | Payer: 59 | Attending: Emergency Medicine | Admitting: Emergency Medicine

## 2014-01-13 DIAGNOSIS — J209 Acute bronchitis, unspecified: Secondary | ICD-10-CM | POA: Diagnosis not present

## 2014-01-13 DIAGNOSIS — G43909 Migraine, unspecified, not intractable, without status migrainosus: Secondary | ICD-10-CM | POA: Insufficient documentation

## 2014-01-13 DIAGNOSIS — E119 Type 2 diabetes mellitus without complications: Secondary | ICD-10-CM | POA: Insufficient documentation

## 2014-01-13 DIAGNOSIS — Z7952 Long term (current) use of systemic steroids: Secondary | ICD-10-CM | POA: Insufficient documentation

## 2014-01-13 DIAGNOSIS — R1110 Vomiting, unspecified: Secondary | ICD-10-CM | POA: Diagnosis present

## 2014-01-13 DIAGNOSIS — Z880 Allergy status to penicillin: Secondary | ICD-10-CM | POA: Diagnosis not present

## 2014-01-13 DIAGNOSIS — Z792 Long term (current) use of antibiotics: Secondary | ICD-10-CM | POA: Insufficient documentation

## 2014-01-13 DIAGNOSIS — Z79899 Other long term (current) drug therapy: Secondary | ICD-10-CM | POA: Diagnosis not present

## 2014-01-13 MED ORDER — ALBUTEROL SULFATE HFA 108 (90 BASE) MCG/ACT IN AERS: 1.0000 | INHALATION_SPRAY | Freq: Four times a day (QID) | RESPIRATORY_TRACT | Status: DC | PRN

## 2014-01-13 MED ORDER — HYDROCOD POLST-CHLORPHEN POLST 10-8 MG/5ML PO LQCR
5.0000 mL | Freq: Two times a day (BID) | ORAL | Status: DC
Start: 2014-01-13 — End: 2014-03-14

## 2014-01-13 MED ORDER — IPRATROPIUM-ALBUTEROL 0.5-2.5 (3) MG/3ML IN SOLN
3.0000 mL | Freq: Once | RESPIRATORY_TRACT | Status: AC
  Administered 2014-01-13: 3 mL via RESPIRATORY_TRACT
  Filled 2014-01-13: qty 3

## 2014-01-13 NOTE — ED Notes (Signed)
Pt reports vomiting x 24 hours. Has chills, cough and sinus pressure. Denies diarrhea.

## 2014-01-13 NOTE — Discharge Instructions (Signed)

## 2014-01-13 NOTE — ED Notes (Signed)
Pt is back in the room and on the monitor

## 2014-01-13 NOTE — ED Provider Notes (Signed)
CSN: [id number]     Arrival date & time 01/13/14  5027 History   First MD Initiated Contact with Patient 01/13/14 [phone]     Chief Complaint  Patient presents with  . Emesis     (Consider location/radiation/quality/duration/timing/severity/associated sxs/prior Treatment) HPI The patient she is not having vomiting as stated in triage. She reports that she is coughing so hard that it is making her headache and throw up small amounts. This is usually a clear mucus. She started developing a respiratory illness about 2 days ago. She reports that she started with a sore throat. She reports she's also had nasal and sinus pressure and drainage. She reports that she gets into a coughing "jagged" and can't stop until it gags her. She denies she has chest pain. She thinks that now her coughing somewhat her chest is a little sore but no sharp or persisting chest discomfort. She denies shortness of breath except for when she is coughing. The patient reports that she has had pneumonia once before a few years ago. No diarrhea. No associated rash. No associated lower extremity symptoms. No Sosa myalgia or headache. Past Medical History  Diagnosis Date  . Diabetes mellitus without complication   . Migraines   . Syncope and collapse    Past Surgical History  Procedure Laterality Date  . Knee surgery    . Wisdom tooth extraction     Family History  Problem Relation Age of Onset  . Diabetes Mother   . Hyperlipidemia Mother   . Hypertension Mother   . Hypertension Father   . Hyperlipidemia Brother   . Hypertension Brother   . Sudden death Neg Hx   . Heart attack Neg Hx    History  Substance Use Topics  . Smoking status: Never Smoker   . Smokeless tobacco: Never Used  . Alcohol Use: No   OB History    No data available     Review of Systems  10 Systems reviewed and are negative for acute change except as noted in the HPI.   Allergies  Penicillins  Home Medications   Prior to Admission  medications   Medication Sig Start Date End Date Taking? Authorizing Provider  DASETTA 1/35 tablet take 1 tablet by mouth once daily 10/30/13  Yes Willeen Niece, MD  doxylamine, Sleep, (UNISOM) 25 MG tablet Take 50 mg by mouth at bedtime as needed for sleep.   Yes Historical Provider, MD  losartan (COZAAR) 25 MG tablet take 1 tablet by mouth once daily 01/26/13  Yes Willeen Niece, MD  metFORMIN (GLUCOPHAGE) 500 MG tablet take 1 tablet by mouth twice a day with food 01/26/13  Yes Willeen Niece, MD  PARoxetine (PAXIL CR) 37.5 MG 24 hr tablet Take 1 tablet (37.5 mg total) by mouth daily. 01/04/14  Yes Willeen Niece, MD  propranolol (INDERAL) 20 MG tablet take 1 tablet by mouth twice a day 12/27/13  Yes Willeen Niece, MD  SUMAtriptan (IMITREX) 100 MG tablet Take 1 tablet (100 mg total) by mouth every 2 (two) hours as needed for migraine or headache. May repeat in 2 hours if headache persists or recurs. 01/26/13  Yes Willeen Niece, MD  albuterol (PROVENTIL HFA;VENTOLIN HFA) 108 (90 BASE) MCG/ACT inhaler Inhale 1-2 puffs into the lungs every 6 (six) hours as needed for wheezing or shortness of breath. 01/13/14   Charlesetta Shanks, MD  azithromycin (ZITHROMAX) 250 MG tablet Take 2 tablets by mouth on day one, then 1 tablet  daily for the following 4 days. Patient not taking: Reported on 01/13/2014 04/13/13   Willeen Niece, MD  benzonatate (TESSALON) 100 MG capsule Take 1 capsule (100 mg total) by mouth 2 (two) times daily as needed for cough. Patient not taking: Reported on 01/13/2014 04/13/13   Willeen Niece, MD  chlorpheniramine-HYDROcodone [facility]) 10-8 MG/5ML [facility] Take 5 mLs by mouth 2 (two) times daily. 01/13/14   Charlesetta Shanks, MD  ipratropium (ATROVENT) 0.06 % nasal spray Place 2 sprays into both nostrils 4 (four) times daily. Patient not taking: Reported on 01/13/2014 08/09/13   Gregor Hams, MD  ondansetron (ZOFRAN) 4 MG tablet Take 1 tablet (4 mg total) by mouth every 6 (six) hours.  Prn n/v 03/19/13   Billy Fischer, MD  ondansetron (ZOFRAN) 8 MG tablet Take 1 tablet (8 mg total) by mouth every 8 (eight) hours as needed for nausea or vomiting. Patient not taking: Reported on 01/13/2014 08/09/13   Gregor Hams, MD  predniSONE (DELTASONE) 10 MG tablet Take 3 tablets (30 mg total) by mouth daily. Patient not taking: Reported on 01/13/2014 08/09/13   Gregor Hams, MD   BP 114/64 mmHg  Pulse 67  Temp(Src) 98 F (36.7 C) (Oral)  Resp 18  SpO2 98%  LMP 12/23/2013 Physical Exam  Constitutional: She is oriented to person, place, and time. She appears well-developed and well-nourished.  HENT:  Head: Normocephalic and atraumatic.  Bilateral TMs have serous effusion with some bulging, no erythema and no purulence.  Eyes: EOM are normal. Pupils are equal, round, and reactive to light.  Neck: Neck supple.  Cardiovascular: Normal rate, regular rhythm, normal heart sounds and intact distal pulses.   Pulmonary/Chest: Effort normal and breath sounds normal.  Abdominal: Soft. Bowel sounds are normal. She exhibits no distension. There is no tenderness.  Musculoskeletal: Normal range of motion. She exhibits no edema.  Neurological: She is alert and oriented to person, place, and time. She has normal strength. Coordination normal. GCS eye subscore is 4. GCS verbal subscore is 5. GCS motor subscore is 6.  Skin: Skin is warm, dry and intact.  Psychiatric: She has a normal mood and affect.    ED Course  Procedures (including critical care time) Labs Review Labs Reviewed - No data to display  Imaging Review No results found.   EKG Interpretation None      MDM   Final diagnoses:  Acute bronchitis, unspecified organism  Post-tussive emesis   Patient with well appearance. CXR no infiltrate and no hypoxia.   Charlesetta Shanks, MD 01/15/14 1037

## 2014-01-13 NOTE — ED Notes (Addendum)
Pt is still in x-ray at this time  

## 2014-01-22 ENCOUNTER — Encounter: Payer: Self-pay | Admitting: Family Medicine

## 2014-01-22 ENCOUNTER — Ambulatory Visit (INDEPENDENT_AMBULATORY_CARE_PROVIDER_SITE_OTHER): Payer: 59 | Admitting: Family Medicine

## 2014-01-22 DIAGNOSIS — R072 Precordial pain: Secondary | ICD-10-CM

## 2014-01-22 DIAGNOSIS — R0789 Other chest pain: Secondary | ICD-10-CM

## 2014-01-22 NOTE — Progress Notes (Signed)
   Subjective:    Patient ID: Anne Daniels, female    DOB: Aug 19, 1975, 38 y.o.   MRN: [id number]  HPI Here for follow up of chest pain. Still has episodes of pain (1-2x/week), random onset not related to exercise, position or food/meals.  Recently seen in ED for     Review of Systems    Recently seen in ED (11/28) for bronchitis, from which she is much improved. Minimal cough. No fevers or chills. No shortness of breath. She continues to have persistent fatigue that is not better despite sleeping more. Recent labs done in November for workup reviewed; normal TSH and CBC, metabolic panel.   Never-smoker: she and husband are going through custody issues around husband's son from prior relationship. Jaycie feels the stress from this situation is improving.  Objective:   Physical Exam Generally well appearing, no apparent distress HEENT neck supple, no conjunctival pallor. MMM no cervical adenopathy. COR Regular S1S2, no extra sounds PULM Clear bilaterally, no rales or wheezes ABD Soft, nontender, nondistended.        Assessment & Plan:

## 2014-01-22 NOTE — Patient Instructions (Signed)
   REFERRAL FOR CARDIOLOGY APPOINTMENT.   ECHOCARDIOGRAM TO EVALUATE HEART.   FOLLOW UP WITH DR Lindell Noe 2-3 WEEKS.   Patient may be reached [phone] (cell).

## 2014-01-22 NOTE — Assessment & Plan Note (Signed)
January 22, 2014: Patient continues to have central chest pain "like I'm hit in the chest", occurs randomly and not associated with exertion, position, meals, or breathing. Resolves within 10 minutes, not associated with dyspnea, nausea/vomiting, palpitations, or diaphoresis. At last exam she had epigastric and RUQ tenderness (positive Murphy's sign) and subsequently had a negative Abdominal US. Also reports worsening fatigue that is not explained by recent labs (normal H/H, TSH, metabolic panel.). For ECHO and cardiology referral for evaluation for possible cardiac causes including ischemic heart disease, HF, or dysrhythmias given suddenness of chest pain and randomness of onset.

## 2014-01-23 ENCOUNTER — Telehealth: Payer: Self-pay | Admitting: *Deleted

## 2014-01-23 NOTE — Telephone Encounter (Signed)
LMOVM for pt to call us back. She is scheduled at [facility] for her 2D echo on Thursday December 17th @ 10:30. Must arrive at 10:15. Piccola [place] Kennon Holter, CMA

## 2014-01-31 ENCOUNTER — Ambulatory Visit (HOSPITAL_COMMUNITY): Payer: 59 | Attending: Cardiology | Admitting: Radiology

## 2014-01-31 DIAGNOSIS — I340 Nonrheumatic mitral (valve) insufficiency: Secondary | ICD-10-CM | POA: Insufficient documentation

## 2014-01-31 DIAGNOSIS — I071 Rheumatic tricuspid insufficiency: Secondary | ICD-10-CM | POA: Diagnosis not present

## 2014-01-31 DIAGNOSIS — R072 Precordial pain: Secondary | ICD-10-CM | POA: Diagnosis present

## 2014-01-31 DIAGNOSIS — R0789 Other chest pain: Secondary | ICD-10-CM

## 2014-01-31 NOTE — Progress Notes (Signed)
Echocardiogram performed.  

## 2014-02-01 ENCOUNTER — Telehealth: Payer: Self-pay | Admitting: Family Medicine

## 2014-02-01 ENCOUNTER — Encounter: Payer: Self-pay | Admitting: Family Medicine

## 2014-02-01 NOTE — Telephone Encounter (Signed)
Called patient's mobile and left message. Her recent ECHO was normal. I am sending a letter to her with the results as well. JB

## 2014-02-19 ENCOUNTER — Encounter: Payer: Self-pay | Admitting: Family Medicine

## 2014-02-19 ENCOUNTER — Ambulatory Visit (INDEPENDENT_AMBULATORY_CARE_PROVIDER_SITE_OTHER): Payer: 59 | Admitting: Family Medicine

## 2014-02-19 DIAGNOSIS — R0789 Other chest pain: Secondary | ICD-10-CM

## 2014-02-19 DIAGNOSIS — R072 Precordial pain: Secondary | ICD-10-CM

## 2014-02-19 MED ORDER — LOSARTAN POTASSIUM 25 MG PO TABS: ORAL_TABLET | ORAL | Status: DC

## 2014-02-19 MED ORDER — PROPRANOLOL HCL 20 MG PO TABS: 20.0000 mg | ORAL_TABLET | Freq: Two times a day (BID) | ORAL | Status: DC

## 2014-02-19 NOTE — Patient Instructions (Signed)
It was a pleasure to see you today.   I would like you to hold the Imitrex and the birth control pills until you have been evaluated by Cardiology.   Refills losartan and propranolol today.   Continue with aspirin 81mg  daily.   Cardiology evaluation before our next visit.   FOLLOW UP VISIT WITH DR Lindell Noe 4 WEEKS.

## 2014-02-20 NOTE — Progress Notes (Signed)
   Subjective:    Patient ID: Anne Daniels, female    DOB: 1976/02/07, 39 y.o.   MRN: [id number]  HPI Patient here accompanied by her husband Anne Daniels, for follow up of her left-sided chest pain. She had ECHO mid-December which showed some mild LVH and EF 55-60%, no wall motion abnormalities and normal valvular function/PA pressures. She does not have chest pain or discomfort today and feels well.   Reports she has had chest pain on the following dates: 12/12, 12/15 92x), 12/16, 12,/20, 12/24, 12/28, 01/01, 01/04.  On 12/15, the pain awoke her at 01:15am and again at 03:30am, was more severe than usual and sliced through to her left scapula and associated on that day with nausea and diaphoresis.  On other days is Left-sided, onset at various times and not associated with any particular activity; usually associated with dyspnea but not nausea or diaphoresis, and usually resolves in about 5 minutes. No associated palpitations or mind-racing.   Reports that her migraine headaches have abated significantly since last visit and starting the Paxil.  Still takes propranolol as well.  Does not take the Imitrex often because she does not need (headaches previously 3-5x/week, now one mild HA one time a week). Hasn't used Imitrex in over a month.   LMP 01/13/2014.  Uses OCPs for cycle regulation, tolerates well.   Never-smoker. Family Hx; Mother and father both with CAD.   Review of Systems No fevers or chills, no cough, no diarrhea or nausea/vomiting.     Objective:   Physical Exam Well appearing, no apparent distress HEENT Neck supple, no cervical adenopathy.  COR regular S1S2, no extra sounds. No chest wall tenderness or left shoulder tenderness with palpation or passive/active ROM testing.  PULM Clear bilaterally, no rales or wheezes        Assessment & Plan:

## 2014-02-20 NOTE — Assessment & Plan Note (Signed)
Patient continues to have left-sided chest pain, feels like "kicking in the chest" accompanied by dyspnea, onset usually at rest (cites episodes while at work, other times while in bed at night). On at least one occasion has been associated with diaphoresis and nausea, lasts for 5 minutes and then resolves. Unable to tolerate NTG due to syncope.  Discussed concern for unstable angina and discussed option of direct admission with cardiology consult today, versus outpatient approach. She is not having any chest pain or discomfort at the time of this visit; has had ECHO which is relatively unremarkable, ECG done at previous visit without significant changes from prior ECGs.  Patient is aware of the importance of timely evaluation and opts for outpatient Cardiology referral and is amebal to stress testing.  She and her husband, both at today's visit, are in agreement that she is to call 9-1-1 for immediate transport to ED if she experiences recurrence of the pain that does not resolve within a few minutes, is more severe than prior episodes, or is accompanied by diaphoresis/nausea. She is asked to refrain frojm using triptans or her estrogen-containing OCPs until evaluated by Cardiology. She is using ASA daily as recommended. To avoid strenuous activity until Cardiology evaluation.

## 2014-02-22 ENCOUNTER — Other Ambulatory Visit: Payer: Self-pay | Admitting: Family Medicine

## 2014-03-01 ENCOUNTER — Telehealth: Payer: Self-pay | Admitting: Family Medicine

## 2014-03-01 NOTE — Telephone Encounter (Signed)
Called patient at her mobile number in chart, to inquire about her welfare.  She reports she continues to have chest discomfort @ every three days, lasting for a few minutes and not provoked by exertion.  No significant change since her most recent visit with me. I explained my concern for possible unstable angina and need for prompt evaluation. She has an appointment with Cardiology scheduled for Jan 28th.  I will ask our staff to see if more prompt outpatient Cardiology evaluation is possible.  I also instructed patient to present to hospital ED with recurrent/worsening symptoms. JB

## 2014-03-01 NOTE — Telephone Encounter (Signed)
Called cardiology and they didn't have any openings earlier than her scheduled January 28th appt. Kamon Fahr Kennon Holter, CMA

## 2014-03-04 NOTE — Telephone Encounter (Signed)
If patient able to be seen elsewhere in the [facility] network sooner than Jan 28th, would schedule at a different office (other than [facility] across the street).  Thanks,  JB

## 2014-03-14 ENCOUNTER — Encounter: Payer: Self-pay | Admitting: Cardiology

## 2014-03-14 ENCOUNTER — Ambulatory Visit (INDEPENDENT_AMBULATORY_CARE_PROVIDER_SITE_OTHER): Payer: 59 | Admitting: Cardiology

## 2014-03-14 VITALS — BP 92/74 | HR 82 | Ht 67.0 in | Wt 258.0 lb

## 2014-03-14 DIAGNOSIS — R0789 Other chest pain: Secondary | ICD-10-CM

## 2014-03-14 DIAGNOSIS — R072 Precordial pain: Secondary | ICD-10-CM

## 2014-03-14 NOTE — Patient Instructions (Signed)
Your physician has requested that you have a lexiscan myoview. For further information please visit [email or web address]. Please follow instruction sheet, as given.  Follow up as needed 

## 2014-03-14 NOTE — Progress Notes (Addendum)
Cardiology Office Note   Date:  03/14/2014   ID:  Anne Daniels, DOB [birth year], MRN [id number]  PCP:  Willeen Niece, MD  Cardiologist:   Darlin Coco, MD   No chief complaint on file.     History of Present Illness: Anne Daniels is a 39 y.o. female who presents for evaluation of chest discomfort.  She is a medical patient of Dr. Dalbert Mayotte at the [facility]. The patient has been experiencing intermittent substernal pressure-like discomfort in her chest since November.  At times it feels like somebody standing on her chest.  On January 3 she had the most complex episode which was associated with shortness of breath diaphoresis and discomfort in the center of her chest radiating through to her back and associated with left arm tingling.  That episode occurred during sleep and awakened her.  The patient has a past history of cardio neurogenic syncope.  She has had no seizures for 6 years and has been on Paxil successfully for this.  The patient has a history of type 2 diabetes.  She is on losartan for renal protection.  She had been on long-term oral contraceptives which were stopped on 02/20/2014. Recent echocardiogram on 01/31/14 showed normal left ventricular systolic function with ejection fraction of 55-60% and no wall motion abnormalities.  No significant valve abnormalities.    Past Medical History  Diagnosis Date  . Diabetes mellitus without complication   . Migraines   . Syncope and collapse     Past Surgical History  Procedure Laterality Date  . Knee surgery    . Wisdom tooth extraction       Current Outpatient Prescriptions  Medication Sig Dispense Refill  . albuterol (PROVENTIL HFA;VENTOLIN HFA) 108 (90 BASE) MCG/ACT inhaler Inhale 1-2 puffs into the lungs every 6 (six) hours as needed for wheezing or shortness of breath. 1 Inhaler 0  . ipratropium (ATROVENT) 0.06 % nasal spray Place 2 sprays into both nostrils 4 (four) times daily. (Patient  taking differently: Place 2 sprays into both nostrils as needed. ) 15 mL 1  . losartan (COZAAR) 25 MG tablet take 1 tablet by mouth once daily 30 tablet 11  . metFORMIN (GLUCOPHAGE) 500 MG tablet take 1 tablet by mouth twice a day with food 60 tablet 11  . PARoxetine (PAXIL CR) 37.5 MG 24 hr tablet Take 1 tablet (37.5 mg total) by mouth daily. 30 tablet 6  . propranolol (INDERAL) 20 MG tablet Take 1 tablet (20 mg total) by mouth 2 (two) times daily. 60 tablet 5   No current facility-administered medications for this visit.    Allergies:   Penicillins    Social History:  The patient  reports that she has never smoked. She has never used smokeless tobacco. She reports that she does not drink alcohol or use illicit drugs.   Family History:  The patient's family history includes Diabetes in her mother; Hyperlipidemia in her brother and mother; Hypertension in her brother, father, and mother. There is no history of Sudden death or Heart attack. both mother and her father have angina pectoris   ROS:  Please see the history of present illness.   Otherwise, review of systems are positive for intermittent chronic bronchitis for which she has inhalers on hand.   All other systems are reviewed and negative.    PHYSICAL EXAM: VS:  BP 92/74 mmHg  Pulse 82  Ht 5\' 7"  (1.702 m)  Wt 258 lb (117.028 kg)  BMI 40.40 kg/m2  LMP 01/13/2014 , BMI Body mass index is 40.4 kg/(m^2). GEN: Well nourished, well developed, in no acute distress HEENT: normal Neck: no JVD, carotid bruits, or masses Cardiac: RRR; no murmurs, rubs, or gallops,no edema  Respiratory:  clear to auscultation bilaterally, normal work of breathing GI: soft, nontender, nondistended, + BS MS: no deformity or atrophy Skin: warm and dry, no rash Neuro:  Strength and sensation are intact Psych: euthymic mood, full affect   EKG:  EKG is ordered today. The ekg ordered today demonstrates normal sinus rhythm.  Within normal  limits.   Recent Labs: 01/04/2014: ALT 18; BUN 10; Creatinine 0.89; Hemoglobin 15.0; Platelets 251; Potassium 4.4; Sodium 138; TSH 2.701    Lipid Panel    Component Value Date/Time   CHOL 158 01/04/2014 1038   TRIG 229* 01/04/2014 1038   HDL 43 01/04/2014 1038   CHOLHDL 3.7 01/04/2014 1038   VLDL 46* 01/04/2014 1038   LDLCALC 69 01/04/2014 1038   LDLDIRECT 107* 10/17/2009 2121      Wt Readings from Last 3 Encounters:  03/14/14 258 lb (117.028 kg)  02/19/14 255 lb 1.6 oz (115.713 kg)  01/22/14 254 lb 6.4 oz (115.395 kg)      Other studies Reviewed: Additional studies/ records that were reviewed today include: 2D echo Review of the above records demonstrates: normal EF   ASSESSMENT AND PLAN:  1.  Chest pain 2.  Diabetes mellitus 3.  History of cardio neurogenic syncope 4.  History of intermittent bronchitis.  He is a nonsmoker 5.  Strong family history of ischemic heart disease 6. History of left knee surgery following trauma   Current medicines are reviewed at length with the patient today.  The patient does not have concerns regarding medicines.  The following changes have been made:  no change  Labs/ tests ordered today include: Lexiscan myoview   Orders Placed This Encounter  Procedures  . Myocardial Perfusion Imaging  . EKG 12-Lead     Disposition:  We'll have the patient return for a Lexiscan Myoview stress test.. Many thanks for the opportunity to see this pleasant woman with you.   Signed, Darlin Coco, MD  03/14/2014 5:57 PM    [facility] [address], [place], [place]  [address] Phone: [phone]; Fax: [phone]  Addendum: The myoview was abnormal. Intermediate risk stress nuclear study with significant artifacts affecting ability to read study, however there appears to be medium size, moderate severity ischemia in the mid and distal inferior and mid inferolateral walls. SDS 5.. Results discussed with  patient by phone on 04/01/14. Have recommended cath/possible PCI.  Patient is agreeable. Cath scheduled for Dr. Burt Knack at noon on 04/05/14.

## 2014-03-21 ENCOUNTER — Ambulatory Visit (HOSPITAL_COMMUNITY): Payer: 59 | Attending: Cardiology | Admitting: Radiology

## 2014-03-21 DIAGNOSIS — R079 Chest pain, unspecified: Secondary | ICD-10-CM | POA: Diagnosis present

## 2014-03-21 DIAGNOSIS — R0602 Shortness of breath: Secondary | ICD-10-CM | POA: Insufficient documentation

## 2014-03-21 DIAGNOSIS — R61 Generalized hyperhidrosis: Secondary | ICD-10-CM | POA: Diagnosis not present

## 2014-03-21 DIAGNOSIS — R0789 Other chest pain: Secondary | ICD-10-CM

## 2014-03-21 DIAGNOSIS — E119 Type 2 diabetes mellitus without complications: Secondary | ICD-10-CM | POA: Diagnosis not present

## 2014-03-21 DIAGNOSIS — R072 Precordial pain: Secondary | ICD-10-CM

## 2014-03-21 MED ORDER — TECHNETIUM TC 99M SESTAMIBI GENERIC - CARDIOLITE
30.0000 | Freq: Once | INTRAVENOUS | Status: AC | PRN
  Administered 2014-03-21: 30 via INTRAVENOUS

## 2014-03-21 MED ORDER — REGADENOSON 0.4 MG/5ML IV SOLN
0.4000 mg | Freq: Once | INTRAVENOUS | Status: AC
  Administered 2014-03-21: 0.4 mg via INTRAVENOUS

## 2014-03-21 NOTE — Progress Notes (Signed)
[facility] [address] [place], [place] [address] [phone]    Cardiology Nuclear Med Study  Anne Daniels is a 39 y.o. female     MRN : [id number]     DOB: 19-Jun-1975  Procedure Date: 03/21/2014  Nuclear Med Background Indication for Stress Test:  Evaluation for Ischemia History:  History of seizures Cardiac Risk Factors: NIDDM  Symptoms:  Chest Pain (last date of chest discomfort was yesterday), Diaphoresis and SOB   Nuclear Pre-Procedure Caffeine/Decaff Intake:  None NPO After: 8:00am   Lungs:  clear O2 Sat: 98% on room air. IV 0.9% NS with Angio Cath:  22g  IV Site: R Antecubital  IV Started by:  Crissie Figures, RN  Chest Size (in):  46 Cup Size: C  Height: 5\' 7"  (1.702 m)  Weight:  255 lb (115.667 kg)  BMI:  Body mass index is 39.93 kg/(m^2). Tech Comments:  N/A    Nuclear Med Study 1 or 2 day study: 2 day  Stress Test Type:  Lexiscan  Reading MD: N/A  Order Authorizing Provider:  Darlin Coco, MD  Resting Radionuclide: Technetium 43m Sestamibi  Resting Radionuclide Dose: 11.0 mCi   Stress Radionuclide:  Technetium 39m Sestamibi  Stress Radionuclide Dose: 33.0 mCi           Stress Protocol Rest HR: 66 Stress HR: 101  Rest BP: 99/70 Stress BP: 109/56  Exercise Time (min): n/a METS: n/a   Predicted Max HR: 182 bpm % Max HR: 55.49 bpm Rate Pressure Product: 13130   Dose of Adenosine (mg):  n/a Dose of Lexiscan: 0.4 mg  Dose of Atropine (mg): n/a Dose of Dobutamine: n/a mcg/kg/min (at max HR)  Stress Test Technologist: Glade Lloyd, BS-ES  Nuclear Technologist:  Earl Many, CNMT     Rest Procedure:  Myocardial perfusion imaging was performed at rest 45 minutes following the intravenous administration of Technetium 66m Sestamibi. Rest ECG: NSR - Normal EKG  Stress Procedure:  The patient received IV Lexiscan 0.4 mg over 15-seconds.  Technetium 2m Sestamibi injected at 30-seconds.  Quantitative spect images were  obtained after a 45 minute delay.  During the infusion of Lexiscan the patient complained of SOB and feeling warm.  These symptoms began to resolve in recovery.  Stress ECG: No significant change from baseline ECG  QPS Raw Data Images:  The study quality is heaviluy affected by breast attenuation, diaphragmatic attenuation and extracardiac uptake.  Stress Images:  Decreased uptake in the mid and distal inferior and mid inferolateral walls.  Rest Images:  Normal homogeneous uptake in all areas of the myocardium. Subtraction (SDS):  SDS 5.  Transient Ischemic Dilatation (Normal <1.22):  1.05 Lung/Heart Ratio (Normal <0.45):  0.26  Quantitative Gated Spect Images QGS EDV:  90 ml QGS ESV:  35 ml  Impression Exercise Capacity:  Lexiscan with no exercise. BP Response:  Normal blood pressure response. Clinical Symptoms:  There is dyspnea. ECG Impression:  No significant ST segment change suggestive of ischemia. Comparison with Prior Nuclear Study: No images to compare  Overall Impression:  Intermediate risk stress nuclear study with significant artifacts affecting ability to read study, however there appears to be medium size, moderate severity ischemia in the mid and distal inferior and mid inferolateral walls. SDS 5..  LV Ejection Fraction: 61%.  LV Wall Motion:  NL LV Function; NL Wall Motion   [name] 03/27/2014

## 2014-03-27 ENCOUNTER — Ambulatory Visit (HOSPITAL_COMMUNITY): Payer: 59 | Attending: Cardiology

## 2014-03-27 DIAGNOSIS — R0989 Other specified symptoms and signs involving the circulatory and respiratory systems: Secondary | ICD-10-CM

## 2014-03-27 MED ORDER — TECHNETIUM TC 99M SESTAMIBI GENERIC - CARDIOLITE
33.0000 | Freq: Once | INTRAVENOUS | Status: AC | PRN
  Administered 2014-03-27: 33 via INTRAVENOUS

## 2014-04-01 ENCOUNTER — Other Ambulatory Visit: Payer: Self-pay | Admitting: Cardiology

## 2014-04-01 ENCOUNTER — Telehealth: Payer: Self-pay | Admitting: *Deleted

## 2014-04-01 ENCOUNTER — Encounter: Payer: Self-pay | Admitting: *Deleted

## 2014-04-01 DIAGNOSIS — R0789 Other chest pain: Secondary | ICD-10-CM

## 2014-04-01 DIAGNOSIS — R079 Chest pain, unspecified: Secondary | ICD-10-CM

## 2014-04-01 NOTE — Telephone Encounter (Signed)
Patient has cath scheduled with Dr Burt Knack for Friday 2/19 Patient aware of instructions and to come to office tomorrow for labs (patient to pick up letter then) Hold Metformin for 24 hours prior and 48 hours after cath, advised patient Dr. Mare Ferrari would like patient to start an Aspirin 81 mg daily starting today

## 2014-04-01 NOTE — Telephone Encounter (Signed)
-----   Message from Darlin Coco, MD sent at 03/29/2014  6:58 PM EST ----- Her stress test is abnormal and is suggestive of reversible ischemia. I tried to call her Friday night and had to leave message on her machine. I would like to set her up for outpatient cath if she is agreeable. Discuss Monday.

## 2014-04-01 NOTE — Addendum Note (Signed)
Addended by: Alvina Filbert B on: 04/01/2014 03:31 PM   Modules accepted: Orders

## 2014-04-02 ENCOUNTER — Other Ambulatory Visit (INDEPENDENT_AMBULATORY_CARE_PROVIDER_SITE_OTHER): Payer: 59 | Admitting: *Deleted

## 2014-04-02 DIAGNOSIS — R072 Precordial pain: Secondary | ICD-10-CM

## 2014-04-02 DIAGNOSIS — R0789 Other chest pain: Secondary | ICD-10-CM

## 2014-04-02 LAB — CBC WITH DIFFERENTIAL/PLATELET
BASOS ABS: 0.1 10*3/uL (ref 0.0–0.1)
Basophils Relative: 1 % (ref 0.0–3.0)
EOS ABS: 0.2 10*3/uL (ref 0.0–0.7)
Eosinophils Relative: 3.7 % (ref 0.0–5.0)
HEMATOCRIT: 43.2 % (ref 36.0–46.0)
Hemoglobin: 15 g/dL (ref 12.0–15.0)
LYMPHS PCT: 43.3 % (ref 12.0–46.0)
Lymphs Abs: 2.6 10*3/uL (ref 0.7–4.0)
MCHC: 34.8 g/dL (ref 30.0–36.0)
MCV: 90.2 fl (ref 78.0–100.0)
MONOS PCT: 5.5 % (ref 3.0–12.0)
Monocytes Absolute: 0.3 10*3/uL (ref 0.1–1.0)
NEUTROS PCT: 46.5 % (ref 43.0–77.0)
Neutro Abs: 2.8 10*3/uL (ref 1.4–7.7)
Platelets: 217 10*3/uL (ref 150.0–400.0)
RBC: 4.79 Mil/uL (ref 3.87–5.11)
RDW: 13.3 % (ref 11.5–15.5)
WBC: 6.1 10*3/uL (ref 4.0–10.5)

## 2014-04-02 LAB — BASIC METABOLIC PANEL
BUN: 10 mg/dL (ref 6–23)
CHLORIDE: 107 meq/L (ref 96–112)
CO2: 28 mEq/L (ref 19–32)
Calcium: 8.9 mg/dL (ref 8.4–10.5)
Creatinine, Ser: 0.84 mg/dL (ref 0.40–1.20)
GFR: 80.5 mL/min (ref >60.00)
Glucose, Bld: 120 mg/dL — ABNORMAL HIGH (ref 70–99)
POTASSIUM: 3.7 meq/L (ref 3.5–5.1)
SODIUM: 139 meq/L (ref 135–145)

## 2014-04-02 LAB — PROTIME-INR
INR: 1 ratio (ref 0.8–1.0)
Prothrombin Time: 11.2 s (ref 9.6–13.1)

## 2014-04-02 LAB — APTT: aPTT: 30.2 s (ref 23.4–32.7)

## 2014-04-05 ENCOUNTER — Encounter (HOSPITAL_COMMUNITY)
Admission: RE | Disposition: A | Discharge status: Home / Self Care | Payer: Self-pay | Source: Ambulatory Visit | Attending: Cardiovascular Disease

## 2014-04-05 ENCOUNTER — Ambulatory Visit (HOSPITAL_COMMUNITY)
Admission: RE | Admit: 2014-04-05 | Discharge: 2014-04-05 | Disposition: A | Discharge status: Home / Self Care | Payer: 59 | Source: Ambulatory Visit | Attending: Cardiovascular Disease | Admitting: Cardiovascular Disease

## 2014-04-05 ENCOUNTER — Encounter (HOSPITAL_COMMUNITY): Payer: Self-pay | Admitting: Cardiovascular Disease

## 2014-04-05 DIAGNOSIS — R9439 Abnormal result of other cardiovascular function study: Secondary | ICD-10-CM | POA: Insufficient documentation

## 2014-04-05 DIAGNOSIS — R079 Chest pain, unspecified: Secondary | ICD-10-CM | POA: Diagnosis present

## 2014-04-05 DIAGNOSIS — Z8249 Family history of ischemic heart disease and other diseases of the circulatory system: Secondary | ICD-10-CM | POA: Insufficient documentation

## 2014-04-05 DIAGNOSIS — E119 Type 2 diabetes mellitus without complications: Secondary | ICD-10-CM | POA: Insufficient documentation

## 2014-04-05 DIAGNOSIS — R0789 Other chest pain: Secondary | ICD-10-CM

## 2014-04-05 DIAGNOSIS — R931 Abnormal findings on diagnostic imaging of heart and coronary circulation: Secondary | ICD-10-CM

## 2014-04-05 HISTORY — PX: LEFT HEART CATHETERIZATION WITH CORONARY ANGIOGRAM: SHX5451

## 2014-04-05 LAB — GLUCOSE, CAPILLARY
GLUCOSE-CAPILLARY: 98 mg/dL (ref 70–99)
Glucose-Capillary: 91 mg/dL (ref 70–99)

## 2014-04-05 LAB — PREGNANCY, URINE: PREG TEST UR: NEGATIVE

## 2014-04-05 SURGERY — LEFT HEART CATHETERIZATION WITH CORONARY ANGIOGRAM
Anesthesia: LOCAL

## 2014-04-05 MED ORDER — MIDAZOLAM HCL 2 MG/2ML IJ SOLN
INTRAMUSCULAR | Status: AC
Start: 2014-04-05 — End: 2014-04-05
  Filled 2014-04-05: qty 2

## 2014-04-05 MED ORDER — LIDOCAINE HCL (PF) 1 % IJ SOLN
INTRAMUSCULAR | Status: AC
  Filled 2014-04-05: qty 30

## 2014-04-05 MED ORDER — HEPARIN (PORCINE) IN NACL 2-0.9 UNIT/ML-% IJ SOLN
INTRAMUSCULAR | Status: AC
  Filled 2014-04-05: qty 1000

## 2014-04-05 MED ORDER — ASPIRIN 81 MG PO CHEW
81.0000 mg | CHEWABLE_TABLET | Freq: Once | ORAL | Status: AC
  Administered 2014-04-05: 81 mg via ORAL

## 2014-04-05 MED ORDER — SODIUM CHLORIDE 0.9 % IJ SOLN: 3.0000 mL | INTRAMUSCULAR | Status: DC | PRN

## 2014-04-05 MED ORDER — ASPIRIN 81 MG PO CHEW: 81.0000 mg | CHEWABLE_TABLET | ORAL | Status: AC

## 2014-04-05 MED ORDER — SODIUM CHLORIDE 0.9 % IV SOLN
INTRAVENOUS | Status: DC
  Administered 2014-04-05: 11:00:00 via INTRAVENOUS

## 2014-04-05 MED ORDER — HEPARIN SODIUM (PORCINE) 1000 UNIT/ML IJ SOLN
INTRAMUSCULAR | Status: AC
Start: 2014-04-05 — End: 2014-04-05
  Filled 2014-04-05: qty 1

## 2014-04-05 MED ORDER — ACETAMINOPHEN 325 MG PO TABS: 650.0000 mg | ORAL_TABLET | ORAL | Status: DC | PRN

## 2014-04-05 MED ORDER — FENTANYL CITRATE 0.05 MG/ML IJ SOLN
INTRAMUSCULAR | Status: AC
  Filled 2014-04-05: qty 2

## 2014-04-05 MED ORDER — SODIUM CHLORIDE 0.9 % IJ SOLN: 3.0000 mL | Freq: Two times a day (BID) | INTRAMUSCULAR | Status: DC

## 2014-04-05 MED ORDER — ASPIRIN 81 MG PO CHEW
CHEWABLE_TABLET | ORAL | Status: AC
  Filled 2014-04-05: qty 1

## 2014-04-05 MED ORDER — NITROGLYCERIN 1 MG/10 ML FOR IR/CATH LAB
INTRA_ARTERIAL | Status: AC
  Filled 2014-04-05: qty 10

## 2014-04-05 MED ORDER — SODIUM CHLORIDE 0.9 % IV SOLN
1.0000 mL/kg/h | INTRAVENOUS | Status: DC
  Administered 2014-04-05: 1 mL/kg/h via INTRAVENOUS

## 2014-04-05 MED ORDER — SODIUM CHLORIDE 0.9 % IV SOLN: 250.0000 mL | INTRAVENOUS | Status: DC | PRN

## 2014-04-05 MED ORDER — VERAPAMIL HCL 2.5 MG/ML IV SOLN
INTRAVENOUS | Status: AC
  Filled 2014-04-05: qty 2

## 2014-04-05 MED ORDER — ONDANSETRON HCL 4 MG/2ML IJ SOLN: 4.0000 mg | Freq: Four times a day (QID) | INTRAMUSCULAR | Status: DC | PRN

## 2014-04-05 NOTE — CV Procedure (Signed)
    Cardiac Catheterization Procedure Note  Name: Anne Daniels MRN: [id number] DOB: 1975-03-28  Procedure: Left Heart Cath, Selective Coronary Angiography, LV angiography  Indication: Chest pain, abnormal Myoview (intermediate risk study)   Procedural Details: The right wrist was prepped, draped, and anesthetized with 1% lidocaine. Using the modified Seldinger technique, a 5/6 French Slender sheath was introduced into the right radial artery. 3 mg of verapamil was administered through the sheath, weight-based unfractionated heparin was administered intravenously. Standard Judkins catheters were used for selective coronary angiography and left ventriculography. Catheter exchanges were performed over an exchange length guidewire. There were no immediate procedural complications. A TR band was used for radial hemostasis at the completion of the procedure.  The patient was transferred to the post catheterization recovery area for further monitoring.  Procedural Findings: Hemodynamics: AO 104/63 LV 109/10  Coronary angiography: Coronary dominance: right  Left mainstem: The left main is angiographically normal. The vessel divides into the LAD and left circumflex.  Left anterior descending (LAD): The LAD is normal in caliber. The vessel has no significant obstruction. However, there are some mild irregularities in the mid vessel but no stenosis is present. The vessel reaches the LV apex. The diagonal branches are patent.  Left circumflex (LCx): The circumflex is patent. The obtuse marginal branches are patent. There is no obstructive coronary disease identified.  Right coronary artery (RCA): The RCA is dominant. There is some mild proximal irregularity. There are no significant stenoses. The PDA and PLA branches are patent.  Left ventriculography: Left ventricular systolic function is normal, LVEF is estimated at 55-65%, there is no significant mitral regurgitation   Estimated Blood Loss:  Minimal  Final Conclusions:   1. Widely patent coronary arteries with minimal luminal irregularities 2. Normal LV systolic function  Recommendations: Risk reduction measures. There is no obstructive coronary disease identified.  [name] MD, [facility] 04/05/2014, 3:14 PM

## 2014-04-05 NOTE — Interval H&P Note (Signed)
History and Physical Interval Note:  04/05/2014 2:44 PM  Anne Daniels  has presented today for surgery, with the diagnosis of c/p  The various methods of treatment have been discussed with the patient and family. After consideration of risks, benefits and other options for treatment, the patient has consented to  Procedure(s): LEFT HEART CATHETERIZATION WITH CORONARY ANGIOGRAM (N/A) as a surgical intervention .  The patient's history has been reviewed, patient examined, no change in status, stable for surgery.  I have reviewed the patient's chart and labs.  Questions were answered to the patient's satisfaction.    Cath Lab Visit (complete for each Cath Lab visit)  Clinical Evaluation Leading to the Procedure:   ACS: No.  Non-ACS:    Anginal Classification: CCS II  Anti-ischemic medical therapy: No Therapy  Non-Invasive Test Results: Intermediate-risk stress test findings: cardiac mortality 1-3%/year  Prior CABG: No previous CABG       [name]

## 2014-04-05 NOTE — Research (Signed)
BIOFLOW V Informed Consent   Subject Name: Anne Daniels  Subject met inclusion and exclusion criteria.  The informed consent form, study requirements and expectations were reviewed with the subject and questions and concerns were addressed prior to the signing of the consent form.  The subject verbalized understanding of the trail requirements.  The subject agreed to participate in the [facility] and signed the informed consent at 1430 on 04/05/14.  The informed consent was obtained prior to performance of any protocol-specific procedures for the subject.  A copy of the signed informed consent was given to the subject and a copy was placed in the subject's medical record.  Blossom Hoops 04/05/2014, 2:41 PM

## 2014-04-05 NOTE — H&P (View-Only) (Signed)
Cardiology Office Note   Date:  03/14/2014   ID:  Anne Daniels, DOB [birth year], MRN [id number]  PCP:  Willeen Niece, MD  Cardiologist:   Darlin Coco, MD   No chief complaint on file.     History of Present Illness: Anne Daniels is a 39 y.o. female who presents for evaluation of chest discomfort.  She is a medical patient of Dr. Dalbert Mayotte at the family practice Center. The patient has been experiencing intermittent substernal pressure-like discomfort in her chest since November.  At times it feels like somebody standing on her chest.  On January 3 she had the most complex episode which was associated with shortness of breath diaphoresis and discomfort in the center of her chest radiating through to her back and associated with left arm tingling.  That episode occurred during sleep and awakened her.  The patient has a past history of cardio neurogenic syncope.  She has had no seizures for 6 years and has been on Paxil successfully for this.  The patient has a history of type 2 diabetes.  She is on losartan for renal protection.  She had been on long-term oral contraceptives which were stopped on 02/20/2014. Recent echocardiogram on 01/31/14 showed normal left ventricular systolic function with ejection fraction of 55-60% and no wall motion abnormalities.  No significant valve abnormalities.    Past Medical History  Diagnosis Date  . Diabetes mellitus without complication   . Migraines   . Syncope and collapse     Past Surgical History  Procedure Laterality Date  . Knee surgery    . Wisdom tooth extraction       Current Outpatient Prescriptions  Medication Sig Dispense Refill  . albuterol (PROVENTIL HFA;VENTOLIN HFA) 108 (90 BASE) MCG/ACT inhaler Inhale 1-2 puffs into the lungs every 6 (six) hours as needed for wheezing or shortness of breath. 1 Inhaler 0  . ipratropium (ATROVENT) 0.06 % nasal spray Place 2 sprays into both nostrils 4 (four) times daily. (Patient  taking differently: Place 2 sprays into both nostrils as needed. ) 15 mL 1  . losartan (COZAAR) 25 MG tablet take 1 tablet by mouth once daily 30 tablet 11  . metFORMIN (GLUCOPHAGE) 500 MG tablet take 1 tablet by mouth twice a day with food 60 tablet 11  . PARoxetine (PAXIL CR) 37.5 MG 24 hr tablet Take 1 tablet (37.5 mg total) by mouth daily. 30 tablet 6  . propranolol (INDERAL) 20 MG tablet Take 1 tablet (20 mg total) by mouth 2 (two) times daily. 60 tablet 5   No current facility-administered medications for this visit.    Allergies:   Penicillins    Social History:  The patient  reports that she has never smoked. She has never used smokeless tobacco. She reports that she does not drink alcohol or use illicit drugs.   Family History:  The patient's family history includes Diabetes in her mother; Hyperlipidemia in her brother and mother; Hypertension in her brother, father, and mother. There is no history of Sudden death or Heart attack. both mother and her father have angina pectoris   ROS:  Please see the history of present illness.   Otherwise, review of systems are positive for intermittent chronic bronchitis for which she has inhalers on hand.   All other systems are reviewed and negative.    PHYSICAL EXAM: VS:  BP 92/74 mmHg  Pulse 82  Ht 5\' 7"  (1.702 m)  Wt 258 lb (117.028 kg)  BMI 40.40 kg/m2  LMP 01/13/2014 , BMI Body mass index is 40.4 kg/(m^2). GEN: Well nourished, well developed, in no acute distress HEENT: normal Neck: no JVD, carotid bruits, or masses Cardiac: RRR; no murmurs, rubs, or gallops,no edema  Respiratory:  clear to auscultation bilaterally, normal work of breathing GI: soft, nontender, nondistended, + BS MS: no deformity or atrophy Skin: warm and dry, no rash Neuro:  Strength and sensation are intact Psych: euthymic mood, full affect   EKG:  EKG is ordered today. The ekg ordered today demonstrates normal sinus rhythm.  Within normal  limits.   Recent Labs: 01/04/2014: ALT 18; BUN 10; Creatinine 0.89; Hemoglobin 15.0; Platelets 251; Potassium 4.4; Sodium 138; TSH 2.701    Lipid Panel    Component Value Date/Time   CHOL 158 01/04/2014 1038   TRIG 229* 01/04/2014 1038   HDL 43 01/04/2014 1038   CHOLHDL 3.7 01/04/2014 1038   VLDL 46* 01/04/2014 1038   LDLCALC 69 01/04/2014 1038   LDLDIRECT 107* 10/17/2009 2121      Wt Readings from Last 3 Encounters:  03/14/14 258 lb (117.028 kg)  02/19/14 255 lb 1.6 oz (115.713 kg)  01/22/14 254 lb 6.4 oz (115.395 kg)      Other studies Reviewed: Additional studies/ records that were reviewed today include: 2D echo Review of the above records demonstrates: normal EF   ASSESSMENT AND PLAN:  1.  Chest pain 2.  Diabetes mellitus 3.  History of cardio neurogenic syncope 4.  History of intermittent bronchitis.  He is a nonsmoker 5.  Strong family history of ischemic heart disease 6. History of left knee surgery following trauma   Current medicines are reviewed at length with the patient today.  The patient does not have concerns regarding medicines.  The following changes have been made:  no change  Labs/ tests ordered today include: Lexiscan myoview   Orders Placed This Encounter  Procedures  . Myocardial Perfusion Imaging  . EKG 12-Lead     Disposition:  We'll have the patient return for a Lexiscan Myoview stress test.. Many thanks for the opportunity to see this pleasant woman with you.   Signed, Darlin Coco, MD  03/14/2014 5:57 PM    [facility] [place], [place], [place]  [address] Phone: [phone]; Fax: [phone]  Addendum: The myoview was abnormal. Intermediate risk stress nuclear study with significant artifacts affecting ability to read study, however there appears to be medium size, moderate severity ischemia in the mid and distal inferior and mid inferolateral walls. SDS 5.. Results discussed with  patient by phone on 04/01/14. Have recommended cath/possible PCI.  Patient is agreeable. Cath scheduled for Dr. Burt Knack at noon on 04/05/14.

## 2014-04-05 NOTE — Discharge Instructions (Signed)
Radial Site Care Refer to this sheet in the next few weeks. These instructions provide you with information on caring for yourself after your procedure. Your caregiver may also give you more specific instructions. Your treatment has been planned according to current medical practices, but problems sometimes occur. Call your caregiver if you have any problems or questions after your procedure. HOME CARE INSTRUCTIONS  You may shower the day after the procedure.Remove the bandage (dressing) and gently wash the site with plain soap and water.Gently pat the site dry.  Do not apply powder or lotion to the site.  Do not submerge the affected site in water for 3 to 5 days.  Inspect the site at least twice daily.  Do not flex or bend the affected arm for 24 hours.  No lifting over 5 pounds (2.3 kg) for 5 days after your procedure.  Do not drive home if you are discharged the same day of the procedure. Have someone else drive you.  You may drive 24 hours after the procedure unless otherwise instructed by your caregiver.  Do not operate machinery or power tools for 24 hours.  A responsible adult should be with you for the first 24 hours after you arrive home. What to expect:  Any bruising will usually fade within 1 to 2 weeks.  Blood that collects in the tissue (hematoma) may be painful to the touch. It should usually decrease in size and tenderness within 1 to 2 weeks. SEEK IMMEDIATE MEDICAL CARE IF:  You have unusual pain at the radial site.  You have redness, warmth, swelling, or pain at the radial site.  You have drainage (other than a small amount of blood on the dressing).  You have chills.  You have a fever or persistent symptoms for more than 72 hours.  You have a fever and your symptoms suddenly get worse.  Your arm becomes pale, cool, tingly, or numb.  You have heavy bleeding from the site. Hold pressure on the site. Document Released: 03/06/2010 Document Revised:  04/26/2011 Document Reviewed: 03/06/2010 [facility] Patient Information 2015 [place], [place]. This information is not intended to replace advice given to you by your health care provider. Make sure you discuss any questions you have with your health care provider.                        Return To Work __________________________________________________ was treated at our facility. 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    _____ Squatting _____ Prolonged standing _____ Lesle Reek _____ Reaching _____ Pushing and pulling _____ Walking _____ Other ____________________ Show this Return to Work statement to Optician, dispensing at work as soon as possible. Your employer should be aware of your condition and can help with the necessary work activity restrictions. If you wish to return to work sooner than the date above, or if you have further problems which make it difficult for you to return at that time, please call us or your caregiver. ____Micheal Burt Knack MD ___ Physician Name _______Per Murray Hodgkins NP for Dr. Talbot Grumbling _________ Signature  _________________________________________ Date 04/05/14   Document Released: 02/01/2005 Document Revised:  04/26/2011 Document Reviewed: 07/19/2006 [employer] Patient Information 2015 [place], [place]. This information is not intended to replace advice given to you by your health care provider. Make sure you discuss any questions you have with your health care provider.

## 2014-04-05 NOTE — Progress Notes (Signed)
Patient heart rate slowed down to in the 40's range and patient became symptomatic with a lower blood pressure around 1745.  Murray Hodgkins NP notified and instructed to run a 250 cc bolus.  Sharolyn Douglas NP stated this is no reason to admit her as blood pressure and Heart rate returned to normal in a few minutes and patient stated she felt just fine.

## 2014-04-08 ENCOUNTER — Telehealth: Payer: Self-pay | Admitting: Cardiology

## 2014-04-08 ENCOUNTER — Telehealth: Payer: Self-pay | Admitting: Family Medicine

## 2014-04-08 DIAGNOSIS — R0789 Other chest pain: Principal | ICD-10-CM

## 2014-04-08 DIAGNOSIS — R079 Chest pain, unspecified: Secondary | ICD-10-CM

## 2014-04-08 MED ORDER — DASETTA 1/35 (28) 1-35 MG-MCG PO TABS: 1.0000 | ORAL_TABLET | Freq: Every day | ORAL | Status: DC

## 2014-04-08 MED ORDER — OMEPRAZOLE 40 MG PO CPDR: 40.0000 mg | DELAYED_RELEASE_CAPSULE | Freq: Every day | ORAL | Status: DC

## 2014-04-08 NOTE — Telephone Encounter (Signed)
New message   Patient had heart cath on Friday 04/05/14 and she is experiencing.   Pt c/o swelling: STAT is pt has developed SOB within 24 hours  1. How long have you been experiencing swelling? Few hours   2. Where is the swelling located? Rt hand  3.  Are you currently taking a "fluid pill"?no   4.  Are you currently SOB? No   5.  Have you traveled recently?no

## 2014-04-08 NOTE — Telephone Encounter (Signed)
Reviewed with Truitt Merle, NP who recommends ice to site and elevation. Nurse visit if pt would like to have swelling checked.  I spoke with pt and gave her this information.  She would like to try ice and elevation.  She will call us back today if she would like to come in for nurse visit.

## 2014-04-08 NOTE — Telephone Encounter (Signed)
Spoke with pt who reports she had cath on Friday (right radial). Developed swelling in right hand and fingers over last few hours.  Site OK with bruising present. No drainage. Right hand same temp as left. Not discolored. Fingers very swollen this AM. Unable to put on jewelry.  Fingers feel asleep. Not painful but difficult to move. Will review with provider in office.

## 2014-04-08 NOTE — Telephone Encounter (Signed)
Called patient to inquire about her welfare following cath on 2/19. She reports she is still having the chest pain.  Will plan for empiric trial of PPI to see if possible GI etiology associated with reflux.  Also, to reinstate her OCP for cycle regulation, given the results of her cath. JB

## 2014-04-09 ENCOUNTER — Encounter: Payer: Self-pay | Admitting: Family Medicine

## 2014-04-09 ENCOUNTER — Ambulatory Visit (INDEPENDENT_AMBULATORY_CARE_PROVIDER_SITE_OTHER): Payer: 59 | Admitting: Family Medicine

## 2014-04-09 VITALS — BP 132/84 | HR 80 | Temp 98.3°F | Ht 67.0 in | Wt 263.5 lb

## 2014-04-09 DIAGNOSIS — R0789 Other chest pain: Secondary | ICD-10-CM

## 2014-04-09 DIAGNOSIS — R079 Chest pain, unspecified: Secondary | ICD-10-CM

## 2014-04-09 NOTE — Patient Instructions (Signed)
It was a pleasure to see you; I am so happy to learn of the results of your cardiac catheterization!  Note for work until February 26th  Continue the recently-started omeprazole 40mg  daily; we will follow up after a few weeks on the medicine.

## 2014-04-10 NOTE — Progress Notes (Signed)
   Subjective:    Patient ID: Anne Daniels, female    DOB: June 08, 1975, 39 y.o.   MRN: [id number]  HPI Anne Daniels is seen today for follow up after having a cardiac catheterization on 2/19 for workup of chest pain with abnormal Myoview; her cath showed no obstructive disease.  She has had some swelling and pain at the R wrist since the cath, continues to elevate and feels it is somewhat better today.  No fevers or chills.   She reports that despite the good news regarding the cath, she has persisted with the L sided chest pain.  Recounts that it is sudden in onset, not associated with activity, time of day, eating/meals, or anything else [name] think of.  Comes on under her L breast and radiates around sternum to area just above the L breast. Usually lasts on the order of a few minutes. No other associated symptoms at this time. No nausea/vomiting. She does not complain of reflux symptoms.   Requests a note out of work until she is scheduled to return this Friday, 2/26, due to her inability to lift anything over 5 lbs and her R wrist pain/swelling. Review of Systems     Objective:   Physical Exam Well appearing, no apparent distress HEENT Neck supple, no cervical adenopathy COR Regular S1S2 PULM Clear bilaterally, no rales or wheezes.  ABD Soft, nondistended. She does have some epigastric tenderness with deep palpation in central epigastrium. No organomegaly.  EXTS R wrist with closed and dry cath site; surrounding biliverdin-discoloration of ecchymosis. Palpable radial pulse R wrist. Grossly intact to fine touch in distal digits of right hand.        Assessment & Plan:

## 2014-04-10 NOTE — Assessment & Plan Note (Signed)
Continues to have chest pain despite cath without obstructive disease. Suspect other causes, such as GI. Will treat empirically with daily PPI.  She has had RUQ [place] earlier in the workup to evaluate for possible gallbladder etiology, negative. She will call in 2 weeks to report the effects of the PPI and we'll make further plan after that. She may resume her OCPs for cycle regulation now that she has tested negative for CAD.

## 2014-04-11 ENCOUNTER — Telehealth: Payer: Self-pay | Admitting: Family Medicine

## 2014-04-11 NOTE — Telephone Encounter (Signed)
Pt called and would like to be out of work until Monday since she had a episode this morning. Please leave the letter up front and let patient know. jw

## 2014-04-12 NOTE — Telephone Encounter (Signed)
Letter written and routed to [facility] Team, please communicate this to patient.  JB

## 2014-04-12 NOTE — Telephone Encounter (Signed)
Pt informed that letter is up at the front desk. Deseree Kennon Holter, CMA

## 2014-05-21 ENCOUNTER — Ambulatory Visit (INDEPENDENT_AMBULATORY_CARE_PROVIDER_SITE_OTHER): Payer: 59 | Admitting: Family Medicine

## 2014-05-21 ENCOUNTER — Encounter: Payer: Self-pay | Admitting: Family Medicine

## 2014-05-21 VITALS — BP 124/84 | HR 73 | Temp 98.6°F | Ht 67.0 in | Wt 262.0 lb

## 2014-05-21 DIAGNOSIS — E119 Type 2 diabetes mellitus without complications: Secondary | ICD-10-CM

## 2014-05-21 DIAGNOSIS — R0789 Other chest pain: Secondary | ICD-10-CM | POA: Diagnosis not present

## 2014-05-21 DIAGNOSIS — E1340 Other specified diabetes mellitus with diabetic neuropathy, unspecified: Secondary | ICD-10-CM

## 2014-05-21 DIAGNOSIS — R079 Chest pain, unspecified: Secondary | ICD-10-CM

## 2014-05-21 DIAGNOSIS — E1140 Type 2 diabetes mellitus with diabetic neuropathy, unspecified: Secondary | ICD-10-CM | POA: Insufficient documentation

## 2014-05-21 LAB — POCT GLYCOSYLATED HEMOGLOBIN (HGB A1C): HEMOGLOBIN A1C: 5.6

## 2014-05-21 MED ORDER — GABAPENTIN 600 MG PO TABS
600.0000 mg | ORAL_TABLET | Freq: Three times a day (TID) | ORAL | Status: DC
Start: 2014-05-21 — End: 2014-11-14

## 2014-05-21 NOTE — Progress Notes (Signed)
   Subjective:    Patient ID: Anne Daniels, female    DOB: 1976/02/13, 39 y.o.   MRN: [id number]  HPI Anne Daniels comes in for followup of her chest pain, which she still has intermittently. Reviewed recent cardiac workup which included negative cardiac catheterization and ECHO. No changes since starting with PPI, except that IBS symptoms (gastrocolic reflex) are better.   She has done some online research into her symptoms on [facility] site, found several correlations with Chronic Fatigue Syndrome that match her presentation. Most important among these are fatigue lasting over six months; sleep without feeling rested; various physical sources of pain; headaches; random sore throat. Does not feel depressed. Has noted some intermittent tingling in feet, hands. Sometimes with tingling and even shooting pain up both arms from wrists to elbows.   No fevers or chills. No cough. Continues with difficulty staying asleep. Has tried melatonin, benadryl 50mg , not helpful for her sleep.   Social Hx reviewed and unchanged.    Review of Systems     Objective:   Physical Exam generally well appearing, no apparent distress HEENT Neck supple, no cervical adenopathy or thyroid nodularity/tenderness. Moist mucus membranes. Conjunctivae intact without pallor.  COR Regular S1S2, no extra sounds, no tenderness to palpate sternum.  PULM clear bilaterally, no rales or wheezes ExTS: Positive Tinnels on R>L. Full handgrip and intact gross sensory testing on hands/upper extremities.  Lower extremities: monofilament testing with decreased sensation in all areas of feet. Palpable dp pulses bilaterally. No skin breakdown.         Assessment & Plan:

## 2014-05-21 NOTE — Assessment & Plan Note (Signed)
Patient with chest pain, intermittent, has had negative cardiac workup including catheterization.  PPI did not resolve the chest pain. She has sevearl associated symptoms that correlate with Chronic Fatigue Syndrome, including feeling exhausted all the time for greater than six months, non-restful sleep; random physical pain. She is taking a SSRI for migraine prophylaxis, has seizure history as well in the past, making her less than an ideal candidate for venlafaxine. Plan for addition of gabapentin to her current regimen, reassessment.

## 2014-05-21 NOTE — Patient Instructions (Signed)
It was a pleasure to see you today.   We will try the gabapentin 600mg  three times a day; start with 1 at bedtime for 5 days, then add one at lunch and finally three times daily.

## 2014-06-06 ENCOUNTER — Other Ambulatory Visit: Payer: Self-pay | Admitting: Family Medicine

## 2014-08-17 ENCOUNTER — Other Ambulatory Visit: Payer: Self-pay | Admitting: Family Medicine

## 2014-09-12 ENCOUNTER — Other Ambulatory Visit: Payer: Self-pay | Admitting: Family Medicine

## 2014-09-12 MED ORDER — OMEPRAZOLE 40 MG PO CPDR: 40.0000 mg | DELAYED_RELEASE_CAPSULE | Freq: Every day | ORAL | Status: DC

## 2014-09-12 NOTE — Telephone Encounter (Signed)
Meds refilled. Please inform pt  Thanks  Akshat Minehart [name] MD, [facility] Resident PGY-2 Pager [phone]

## 2014-09-17 ENCOUNTER — Other Ambulatory Visit: Payer: Self-pay | Admitting: Family Medicine

## 2014-09-17 NOTE — Telephone Encounter (Signed)
Propranolol refilled. Please inform pt  Thanks  Jennessa Trigo [name] MD, [facility] Resident PGY-2 Pager [phone]

## 2014-10-13 ENCOUNTER — Other Ambulatory Visit: Payer: Self-pay | Admitting: Family Medicine

## 2014-10-14 NOTE — Telephone Encounter (Signed)
Dasetta refilled. Please inform the patient  Thanks  Adekunle Rohrbach [name] MD, [facility] Resident PGY-2 Pager [phone]

## 2014-11-14 ENCOUNTER — Other Ambulatory Visit: Payer: Self-pay | Admitting: Family Medicine

## 2014-12-04 LAB — HM DIABETES EYE EXAM

## 2015-01-21 ENCOUNTER — Ambulatory Visit (INDEPENDENT_AMBULATORY_CARE_PROVIDER_SITE_OTHER): Payer: 59 | Admitting: *Deleted

## 2015-01-21 DIAGNOSIS — Z23 Encounter for immunization: Secondary | ICD-10-CM | POA: Diagnosis not present

## 2015-02-05 ENCOUNTER — Encounter: Payer: Self-pay | Admitting: Family Medicine

## 2015-02-05 ENCOUNTER — Ambulatory Visit (INDEPENDENT_AMBULATORY_CARE_PROVIDER_SITE_OTHER): Payer: 59 | Admitting: Family Medicine

## 2015-02-05 VITALS — BP 136/86 | HR 83 | Temp 98.0°F | Wt 266.5 lb

## 2015-02-05 DIAGNOSIS — J020 Streptococcal pharyngitis: Secondary | ICD-10-CM | POA: Diagnosis not present

## 2015-02-05 LAB — POCT RAPID STREP A (OFFICE): Rapid Strep A Screen: NEGATIVE

## 2015-02-05 MED ORDER — AZITHROMYCIN 250 MG PO TABS: ORAL_TABLET | ORAL | Status: DC

## 2015-02-05 NOTE — Progress Notes (Signed)
   Subjective:   CHRISTA VITATOE is a 39 y.o. female with a history of DM, OSA here for sore throat  SORE THROAT  Sore throat began 4 days ago. Also with ear ache Pain is: sharp, worse with swallowing Severity: severe Medications tried: niquil/dayquil Strep throat exposure: none known STD exposure: no  Symptoms Fever: yes Cough: no Runny nose: no Muscle aches: no Swollen Glands: yes Trouble breathing: no Drooling: no Weight loss: no  Patient believes could be caused by: strep throat   Review of Systems:  Per HPI. All other systems reviewed and are negative.   PMH, PSH, Medications, Allergies, and FmHx reviewed and updated in EMR.  Social History: never smoker  Objective:  BP 136/86 mmHg  Pulse 83  Temp(Src) 98 F (36.7 C) (Oral)  Wt 266 lb 8 oz (120.884 kg)  LMP 01/07/2015  Gen:  39 y.o. female in NAD HEENT: NCAT, MMM, EOMI, PERRL, anicteric sclerae, oropharynx erythematous with tonsillar exudate, tender LAD anterior cervical R>L, serous middle ear effusions bilaterally without infection CV: RRR, no MRG, no JVD Resp: Non-labored, CTAB, no wheezes noted Abd: Soft, NTND, BS present, no guarding or organomegaly Ext: WWP, no edema MSK: Full ROM, strength intact Neuro: Alert and oriented, speech normal      Chemistry      Component Value Date/Time   NA 139 04/02/2014 1405   K 3.7 04/02/2014 1405   CL 107 04/02/2014 1405   CO2 28 04/02/2014 1405   BUN 10 04/02/2014 1405   CREATININE 0.84 04/02/2014 1405   CREATININE 0.89 01/04/2014 1038      Component Value Date/Time   CALCIUM 8.9 04/02/2014 1405   ALKPHOS 59 01/04/2014 1038   AST 24 01/04/2014 1038   ALT 18 01/04/2014 1038   BILITOT 1.6* 01/04/2014 1038      Lab Results  Component Value Date   WBC 6.1 04/02/2014   HGB 15.0 04/02/2014   HCT 43.2 04/02/2014   MCV 90.2 04/02/2014   PLT 217.0 04/02/2014   Lab Results  Component Value Date   TSH 2.701 01/04/2014   Lab Results  Component Value  Date   HGBA1C 5.6 05/21/2014   Assessment & Plan:     TASHEBA SELLARDS is a 39 y.o. female here for sore throat  Streptococcal sore throat Sore throat with hoarseness, fevers/chills, congestion, ear pain, rapid strep negative but centor 4 and miserable so will treat empirically - azithro (pcn allergic - anaphylaxis) - GAS throat culture sent - salt water gargles, humidifier - out of work until Friday - f/u if not getting better by next week      Beverlyn Roux, MD, MPH [facility] PGY-3 02/05/2015 10:28 AM

## 2015-02-05 NOTE — Patient Instructions (Signed)

## 2015-02-05 NOTE — Assessment & Plan Note (Addendum)
Sore throat with hoarseness, fevers/chills, congestion, ear pain, rapid strep negative but centor 4 and miserable so will treat empirically - azithro (pcn allergic - anaphylaxis) - GAS throat culture sent - salt water gargles, humidifier - out of work until Friday - f/u if not getting better by next week

## 2015-02-06 LAB — STREP A DNA PROBE: GASP: NOT DETECTED

## 2015-03-11 ENCOUNTER — Other Ambulatory Visit: Payer: Self-pay | Admitting: Student

## 2015-03-11 ENCOUNTER — Other Ambulatory Visit: Payer: Self-pay | Admitting: Family Medicine

## 2015-03-11 ENCOUNTER — Other Ambulatory Visit: Payer: Self-pay | Admitting: Internal Medicine

## 2015-04-14 ENCOUNTER — Ambulatory Visit (INDEPENDENT_AMBULATORY_CARE_PROVIDER_SITE_OTHER): Payer: 59 | Admitting: Family Medicine

## 2015-04-14 ENCOUNTER — Encounter: Payer: Self-pay | Admitting: Family Medicine

## 2015-04-14 VITALS — BP 132/80 | HR 105 | Temp 98.3°F | Wt 268.0 lb

## 2015-04-14 DIAGNOSIS — J029 Acute pharyngitis, unspecified: Secondary | ICD-10-CM

## 2015-04-14 DIAGNOSIS — J069 Acute upper respiratory infection, unspecified: Secondary | ICD-10-CM | POA: Diagnosis not present

## 2015-04-14 LAB — POCT RAPID STREP A (OFFICE): RAPID STREP A SCREEN: NEGATIVE

## 2015-04-14 MED ORDER — IPRATROPIUM BROMIDE 0.06 % NA SOLN: 2.0000 | Freq: Four times a day (QID) | NASAL | Status: DC

## 2015-04-14 MED ORDER — ALBUTEROL SULFATE HFA 108 (90 BASE) MCG/ACT IN AERS: 1.0000 | INHALATION_SPRAY | Freq: Four times a day (QID) | RESPIRATORY_TRACT | Status: DC | PRN

## 2015-04-14 NOTE — Assessment & Plan Note (Signed)
Most likely related to viral illness.  Duration has been three days.

## 2015-04-14 NOTE — Patient Instructions (Addendum)
Thank you for coming in,   I would try taking Afrin for 3 days. This will help with the congestion.  After you have finished a Afrin, I would try the Atrovent nasal spray.  I have also sent an albuterol which can help with her breathing.  He can also try over-the-counter antihistamine/decongestant such as Zyrtec-D, Claritin-D or Allegra-D.  Doing other things such as using a humidifier, honey, lozenges, or VapoRub can help.  Please return in 10 days if no improvement.   Sign up for My Chart to have easy access to your labs results, and communication with your Primary care physician   Please feel free to call with any questions or concerns at any time, at [phone]. --Dr. Raeford Razor Upper Respiratory Infection, Adult Most upper respiratory infections (URIs) are a viral infection of the air passages leading to the lungs. A URI affects the nose, throat, and upper air passages. The most common type of URI is nasopharyngitis and is typically referred to as "the common cold." URIs run their course and usually go away on their own. Most of the time, a URI does not require medical attention, but sometimes a bacterial infection in the upper airways can follow a viral infection. This is called a secondary infection. Sinus and middle ear infections are common types of secondary upper respiratory infections. Bacterial pneumonia can also complicate a URI. A URI can worsen asthma and chronic obstructive pulmonary disease (COPD). Sometimes, these complications can require emergency medical care and may be life threatening.  CAUSES Almost all URIs are caused by viruses. A virus is a type of germ and can spread from one person to another.  RISKS FACTORS You may be at risk for a URI if:   You smoke.   You have chronic heart or lung disease.  You have a weakened defense (immune) system.   You are very young or very old.   You have nasal allergies or asthma.  You work in crowded or poorly ventilated  areas.  You work in health care facilities or schools. SIGNS AND SYMPTOMS  Symptoms typically develop 2-3 days after you come in contact with a cold virus. Most viral URIs last 7-10 days. However, viral URIs from the influenza virus (flu virus) can last 14-18 days and are typically more severe. Symptoms may include:   Runny or stuffy (congested) nose.   Sneezing.   Cough.   Sore throat.   Headache.   Fatigue.   Fever.   Loss of appetite.   Pain in your forehead, behind your eyes, and over your cheekbones (sinus pain).  Muscle aches.  DIAGNOSIS  Your health care provider may diagnose a URI by:  Physical exam.  Tests to check that your symptoms are not due to another condition such as:  Strep throat.  Sinusitis.  Pneumonia.  Asthma. TREATMENT  A URI goes away on its own with time. It cannot be cured with medicines, but medicines may be prescribed or recommended to relieve symptoms. Medicines may help:  Reduce your fever.  Reduce your cough.  Relieve nasal congestion. HOME CARE INSTRUCTIONS   Take medicines only as directed by your health care provider.   Gargle warm saltwater or take cough drops to comfort your throat as directed by your health care provider.  Use a warm mist humidifier or inhale steam from a shower to increase air moisture. This may make it easier to breathe.  Drink enough fluid to keep your urine clear or pale yellow.  Eat soups and other clear broths and maintain good nutrition.   Rest as needed.   Return to work when your temperature has returned to normal or as your health care provider advises. You may need to stay home longer to avoid infecting others. You can also use a face mask and careful hand washing to prevent spread of the virus.  Increase the usage of your inhaler if you have asthma.   Do not use any tobacco products, including cigarettes, chewing tobacco, or electronic cigarettes. If you need help quitting,  ask your health care provider. PREVENTION  The best way to protect yourself from getting a cold is to practice good hygiene.   Avoid oral or hand contact with people with cold symptoms.   Wash your hands often if contact occurs.  There is no clear evidence that vitamin C, vitamin E, echinacea, or exercise reduces the chance of developing a cold. However, it is always recommended to get plenty of rest, exercise, and practice good nutrition.  SEEK MEDICAL CARE IF:   You are getting worse rather than better.   Your symptoms are not controlled by medicine.   You have chills.  You have worsening shortness of breath.  You have brown or red mucus.  You have yellow or brown nasal discharge.  You have pain in your face, especially when you bend forward.  You have a fever.  You have swollen neck glands.  You have pain while swallowing.  You have white areas in the back of your throat. SEEK IMMEDIATE MEDICAL CARE IF:   You have severe or persistent:  Headache.  Ear pain.  Sinus pain.  Chest pain.  You have chronic lung disease and any of the following:  Wheezing.  Prolonged cough.  Coughing up blood.  A change in your usual mucus.  You have a stiff neck.  You have changes in your:  Vision.  Hearing.  Thinking.  Mood. MAKE SURE YOU:   Understand these instructions.  Will watch your condition.  Will get help right away if you are not doing well or get worse.   This information is not intended to replace advice given to you by your health care provider. Make sure you discuss any questions you have with your health care provider.   Document Released: 07/28/2000 Document Revised: 06/18/2014 Document Reviewed: 05/09/2013 Elsevier Interactive Patient Education [employer].

## 2015-04-14 NOTE — Progress Notes (Signed)
   Subjective:    Patient ID: Anne Daniels, female    DOB: Mar 08, 1975, 40 y.o.   MRN: [id number]  Seen for Same day visit for   CC: SORE THROAT  Sore throat began 3 days ago. Pain is: burning  Severity: moderate  Medications tried: Nyquil and Excedrin  Strep throat exposure: no STD exposure: no She works in Therapist, art at [employer] and is unsure if she has had any sick contacts.  Having some post nasal drip, congestion, and constipation.  Denies any itching/watery eyes, sneezing or tinnitus.   Symptoms Fever: no Cough: yes  Runny nose: no Muscle aches: yes  Swollen Glands: no Trouble breathing: no Drooling: no Weight loss: no  PMH: OSA with no CPAP use for the past 5 years  SH: never smoker.   Review of Systems   See HPI for ROS. Objective:  BP 132/80 mmHg  Pulse 105  Temp(Src) 98.3 F (36.8 C) (Oral)  Wt 268 lb (121.564 kg)  SpO2 100%  LMP 04/10/2015 (Exact Date)  General: NAD HEENT: uvula midline, clear conjunctiva, no tonsillar exudates, oropharynx clear, no cervical LAD, MMM, TM's clear and intact bilaterally, turbinates boggy Cardiac: RRR, normal heart sounds, no murmurs. Respiratory: CTAB, normal effort Extremities:  WWP. Skin: warm and dry, no rashes noted Neuro: alert and oriented, no focal deficits     Assessment & Plan:   No problem-specific assessment & plan notes found for this encounter.

## 2015-04-18 ENCOUNTER — Ambulatory Visit (INDEPENDENT_AMBULATORY_CARE_PROVIDER_SITE_OTHER): Payer: 59 | Admitting: Family Medicine

## 2015-04-18 ENCOUNTER — Encounter: Payer: Self-pay | Admitting: Family Medicine

## 2015-04-18 VITALS — BP 150/93 | HR 80 | Temp 97.9°F | Ht 67.0 in | Wt 270.6 lb

## 2015-04-18 DIAGNOSIS — J069 Acute upper respiratory infection, unspecified: Secondary | ICD-10-CM

## 2015-04-18 NOTE — Patient Instructions (Signed)
Try nasal saline and advil cold and sinus.  You can stop the afrin.  Give this another ~5 days to get better.  Take care, Dr.  B   Upper Respiratory Infection, Adult Most upper respiratory infections (URIs) are caused by a virus. A URI affects the nose, throat, and upper air passages. The most common type of URI is often called "the common cold." HOME CARE   Take medicines only as told by your doctor.  Gargle warm saltwater or take cough drops to comfort your throat as told by your doctor.  Use a warm mist humidifier or inhale steam from a shower to increase air moisture. This may make it easier to breathe.  Drink enough fluid to keep your pee (urine) clear or pale yellow.  Eat soups and other clear broths.  Have a healthy diet.  Rest as needed.  Go back to work when your fever is gone or your doctor says it is okay.  You may need to stay home longer to avoid giving your URI to others.  You can also wear a face mask and wash your hands often to prevent spread of the virus.  Use your inhaler more if you have asthma.  Do not use any tobacco products, including cigarettes, chewing tobacco, or electronic cigarettes. If you need help quitting, ask your doctor. GET HELP IF:  You are getting worse, not better.  Your symptoms are not helped by medicine.  You have chills.  You are getting more short of breath.  You have brown or red mucus.  You have yellow or brown discharge from your nose.  You have pain in your face, especially when you bend forward.  You have a fever.  You have puffy (swollen) neck glands.  You have pain while swallowing.  You have white areas in the back of your throat. GET HELP RIGHT AWAY IF:   You have very bad or constant:  Headache.  Ear pain.  Pain in your forehead, behind your eyes, and over your cheekbones (sinus pain).  Chest pain.  You have long-lasting (chronic) lung disease and any of the  following:  Wheezing.  Long-lasting cough.  Coughing up blood.  A change in your usual mucus.  You have a stiff neck.  You have changes in your:  Vision.  Hearing.  Thinking.  Mood. MAKE SURE YOU:   Understand these instructions.  Will watch your condition.  Will get help right away if you are not doing well or get worse.   This information is not intended to replace advice given to you by your health care provider. Make sure you discuss any questions you have with your health care provider.   Document Released: 07/21/2007 Document Revised: 06/18/2014 Document Reviewed: 05/09/2013 Elsevier Interactive Patient Education [employer].

## 2015-04-18 NOTE — Assessment & Plan Note (Signed)
Reassurance given and time course discussed Symptoms consistent with virla URI - do not suspect bacterial sinusitis at this time Advised OTC symptomatic treatment such as nasal saline and advil cold and sinus Return precautions discussed F/u prn

## 2015-04-18 NOTE — Progress Notes (Signed)
   Subjective:   Anne Daniels is a 40 y.o. female with a history of OSA, T2 DM, obesity here for same day appointment for URI  URI - symptoms initially started 1 wk ago - symptoms included sore throat, SOB, vomiting, sinus pressure and congestion, cough (rare and nonproductive) - has h/o sinusitis on and off - reports nasal discharge is green and brownish - use albuterol prn for SOB and that has resolved - tried allegra D and it didn't help and made her feel anxious  - afrin didn't help - no fevers, vomiting has resolved, no body aches - has also started to develop pressure behind b/l ears  Review of Systems:  Per HPI.   Social History: Never smoker  Objective:  BP 150/93 mmHg  Pulse 80  Temp(Src) 97.9 F (36.6 C) (Oral)  Ht 5\' 7"  (1.702 m)  Wt 270 lb 9.6 oz (122.743 kg)  BMI 42.37 kg/m2  LMP 04/10/2015 (Exact Date)  Gen:  40 y.o. female in NAD HEENT: NCAT, MMM, EOMI, PERRL, anicteric sclerae, TMs clear b/l, TTP over L maxillary and frontal sinuses CV: RRR, no MRG Resp: Non-labored, CTAB, no wheezes noted Ext: WWP, no edema Neuro: Alert and oriented, speech normal    Assessment & Plan:     Anne Daniels is a 40 y.o. female here for URI symptoms  Viral URI Reassurance given and time course discussed Symptoms consistent with virla URI - do not suspect bacterial sinusitis at this time Advised OTC symptomatic treatment such as nasal saline and advil cold and sinus Return precautions discussed F/u prn    Virginia Crews, MD MPH PGY-2,  [facility] 04/18/2015  11:34 AM

## 2015-05-01 ENCOUNTER — Ambulatory Visit (INDEPENDENT_AMBULATORY_CARE_PROVIDER_SITE_OTHER): Payer: 59 | Admitting: Family Medicine

## 2015-05-01 ENCOUNTER — Encounter: Payer: Self-pay | Admitting: Family Medicine

## 2015-05-01 VITALS — BP 138/97 | HR 85 | Temp 98.6°F | Wt 266.9 lb

## 2015-05-01 DIAGNOSIS — R69 Illness, unspecified: Principal | ICD-10-CM

## 2015-05-01 DIAGNOSIS — J111 Influenza due to unidentified influenza virus with other respiratory manifestations: Secondary | ICD-10-CM

## 2015-05-01 NOTE — Progress Notes (Signed)
   Subjective:    Patient ID: Anne Daniels, female    DOB: 05/31/1975, 40 y.o.   MRN: [id number]  HPI  Patient presents for Same Day Appointment  CC: sore throat  # Sore throat, fatigue:  Seen 2/27 and 3/3, diagnosed with viral URI  Felt like she had been getting better until Monday and Tuesday when she was really tired, felt like she had a fever, slept all day.  Sore throat continues  Also having some right ear scratchiness/pain/feels like possibly training  Has had continued clear rhinorrhea, which is better as it used to be brown/green  Pain in her cheeks has gotten better/went away  Has felt a little dizzy at times  Has tried nasal saline rinse, nasal spray, advil sinus and cold with good improvement  Has felt a little short of breath, using albuterol inhaler ROS: no vomiting, no nausea  Social Hx: never smoker  Review of Systems   See HPI for ROS.   Past medical history, surgical, family, and social history reviewed and updated in the EMR as appropriate.  Objective:  BP 138/97 mmHg  Pulse 85  Temp(Src) 98.6 F (37 C) (Oral)  Wt 266 lb 14.4 oz (121.065 kg)  LMP 04/10/2015 (Exact Date) Vitals and nursing note reviewed  General: no apparent distress  Eyes: normal conjunctiva and sclera  ENTM: left TM pearly gray without erythema or effusion. Right TM pearly gray, possible small effusion but still with good light reflex. Mucous membranes slightly dry with scalloped tongue, posterior pharynx minimally erythematous without exudates Neck: no anterior cervical lymphadenopathy CV: normal rate, regular rhythm, no murmurs, rubs or gallop.  Resp: clear to auscultation bilaterally, normal effort  Assessment & Plan:   1. Influenza-like illness Suspect new viral infection with extreme fatigue and tiredness on Monday-Tuesday, improving some but not resolved. Possibly influenza, however discussed she would be out of timeframe where tamiflu may be beneficial and thus  testing would not change clinical course. Continue OTC cough/cold, throat lozenges, hand hygiene, hydration. Follow up next week if she is not improving.

## 2015-05-01 NOTE — Patient Instructions (Signed)
It sounds like you may have caught the flu, or another virus that causes flu-like symptoms.  Continue drinking plenty of water, wash your hands often, use the over the counter medicines and throat lozenges. I would expect you to start feeling better over the next 2-4 days. You may still have some symptoms, the most important thing is improvement.  If by next week you still are feeling tired, not getting better, come back to the clinic to see if we need to do any other work up.

## 2015-05-19 ENCOUNTER — Other Ambulatory Visit: Payer: Self-pay | Admitting: *Deleted

## 2015-05-21 MED ORDER — IPRATROPIUM BROMIDE 0.06 % NA SOLN: 2.0000 | Freq: Four times a day (QID) | NASAL | Status: DC

## 2015-06-08 ENCOUNTER — Other Ambulatory Visit: Payer: Self-pay | Admitting: Student

## 2015-07-11 ENCOUNTER — Encounter: Payer: Self-pay | Admitting: Student

## 2015-07-11 ENCOUNTER — Ambulatory Visit (INDEPENDENT_AMBULATORY_CARE_PROVIDER_SITE_OTHER): Payer: 59 | Admitting: Student

## 2015-07-11 VITALS — BP 131/95 | HR 83 | Temp 98.7°F | Wt 272.0 lb

## 2015-07-11 DIAGNOSIS — E119 Type 2 diabetes mellitus without complications: Secondary | ICD-10-CM

## 2015-07-11 DIAGNOSIS — E669 Obesity, unspecified: Secondary | ICD-10-CM

## 2015-07-11 DIAGNOSIS — M25562 Pain in left knee: Secondary | ICD-10-CM | POA: Diagnosis not present

## 2015-07-11 LAB — POCT GLYCOSYLATED HEMOGLOBIN (HGB A1C): Hemoglobin A1C: 6.1

## 2015-07-11 LAB — BASIC METABOLIC PANEL
BUN: 8 mg/dL (ref 7–25)
CALCIUM: 8.8 mg/dL (ref 8.6–10.2)
CO2: 25 mmol/L (ref 20–31)
Chloride: 104 mmol/L (ref 98–110)
Creat: 0.88 mg/dL (ref 0.50–1.10)
GLUCOSE: 117 mg/dL — AB (ref 65–99)
POTASSIUM: 4.4 mmol/L (ref 3.5–5.3)
SODIUM: 138 mmol/L (ref 135–146)

## 2015-07-11 LAB — LIPID PANEL
CHOL/HDL RATIO: 4.4 ratio (ref <=5.0)
CHOLESTEROL: 181 mg/dL (ref 125–200)
HDL: 41 mg/dL — ABNORMAL LOW (ref >=46)
LDL Cholesterol: 103 mg/dL (ref <130)
Triglycerides: 185 mg/dL — ABNORMAL HIGH (ref <150)
VLDL: 37 mg/dL — ABNORMAL HIGH (ref <30)

## 2015-07-11 MED ORDER — PAROXETINE HCL ER 37.5 MG PO TB24: 37.5000 mg | ORAL_TABLET | Freq: Every day | ORAL | Status: DC

## 2015-07-11 MED ORDER — METHYLPREDNISOLONE ACETATE 40 MG/ML IJ SUSP
40.0000 mg | Freq: Once | INTRAMUSCULAR | Status: AC
  Administered 2015-07-11: 40 mg via INTRA_ARTICULAR

## 2015-07-11 NOTE — Progress Notes (Signed)
   Subjective:    Patient ID: Anne Daniels, female    DOB: [birth year], 40 y.o.   MRN: [id number]   CC: Physical exam and left knee pain  HPI: 40 y/o F presenting for physical exam and left knee pain  Left knee pain - History of injury requiring surgery 15 years ago at which time she had 5 knee surgeries - now reports arthritis of that knee diagnosed 2 years ago - she saw ortho at that time and had a steroid injection - her pain resolved until 2 weeks ago - denies trauma and has been taking advil for it with minimal releif - denies weakness  DM - Well controlled DM A1c 6.1 today - on metformin 500 qD - reports home glucoses 902-110s - denies neuropathy  Depression - denies depressive symptoms - needs a refill of Paxil  Smoking status reviewed  Review of Systems  Per HPI else dennies recent illness, chest pain, SOB    Objective:  BP 131/95 mmHg  Pulse 83  Temp(Src) 98.7 F (37.1 C) (Oral)  Wt 272 lb (123.378 kg)  LMP 07/06/2015 (Approximate) Vitals and nursing note reviewed  General: NAD Cardiac: RRR,  Respiratory: CTAB, normal effort Extremities: no edema or cyanosis. Mild tenderness to palpation inferior to the right patella so erythema, lesions or deformities, anterior surgical scar. No left knee pathology. Skin: warm and dry, no rashes noted Neuro: alert and oriented, no focal deficits   Assessment & Plan:    KNEE PAIN, LEFT, CHRONIC Chronic knee pain- concern for arthritic changes - Knee injection given ( see procedure note) - post procedure precautions given - continue NSAIDs for pain as needed  Diabetes mellitus without complication (HCC) 123456 6.1 today - continue metformin - diet and exercise discussed     Rhydian Baldi [name] MD, [facility] Resident PGY-2 Pager [phone]

## 2015-07-11 NOTE — Progress Notes (Signed)
INJECTION: Patient was given informed consent, signed copy in the chart. Appropriate time out was taken. Area prepped and draped in usual sterile fashion. 1 cc of methylprednisolone 40 mg/ml plus  4 cc of 1% lidocaine without epinephrine was injected into the inferio-medial knee space. The patient tolerated the procedure well. There were no complications. Post procedure instructions were given.  Anne Daniels [name] MD, [facility] Resident PGY-2 Pager [phone]

## 2015-07-11 NOTE — Patient Instructions (Signed)
Follow up in 3 months You had a knee injection for arthritic pain If you start to have worsening knee pain, swelling or fevers call the office and go to the ED for evaluation If you have questions or concerns, call the office at [phone]

## 2015-07-11 NOTE — Assessment & Plan Note (Signed)
Chronic knee pain- concern for arthritic changes - Knee injection given ( see procedure note) - post procedure precautions given - continue NSAIDs for pain as needed

## 2015-07-11 NOTE — Assessment & Plan Note (Addendum)
Stable  - Paxil refilled today

## 2015-07-11 NOTE — Assessment & Plan Note (Signed)
A1c 6.1 today - continue metformin - diet and exercise discussed

## 2015-07-28 ENCOUNTER — Other Ambulatory Visit: Payer: Self-pay | Admitting: Student

## 2015-07-28 NOTE — Telephone Encounter (Signed)
Will forward to MD to change script directions and dispense amount.  Jazmin Hartsell,CMA

## 2015-07-28 NOTE — Telephone Encounter (Signed)
Patient states she is suppose to take 2 tab of Paxil-CR per day. The new script reads that patient is to only take 1 tab per day. Patient is requesting that this is corrected. Please advise.

## 2015-07-29 MED ORDER — PAROXETINE HCL ER 37.5 MG PO TB24: 37.5000 mg | ORAL_TABLET | Freq: Every day | ORAL | Status: DC

## 2015-07-31 ENCOUNTER — Other Ambulatory Visit: Payer: Self-pay | Admitting: *Deleted

## 2015-07-31 NOTE — Telephone Encounter (Signed)
Pt needs it to be twice a day instead of once a day. Please advise. Aleaha Fickling Kennon Holter, CMA

## 2015-08-01 ENCOUNTER — Telehealth: Payer: Self-pay | Admitting: Student

## 2015-08-01 MED ORDER — PAROXETINE HCL ER 37.5 MG PO TB24: 37.5000 mg | ORAL_TABLET | Freq: Every day | ORAL | Status: DC

## 2015-08-01 NOTE — Telephone Encounter (Signed)
Patient asks PCP to correct the dosage for Paxiroxitine CR (Paxil CR 37.5 mg) TWICE per day instead of once a day ASAP. Patient doesn't have any and insurance denies the refill.  Please, follow up.

## 2015-08-04 NOTE — Telephone Encounter (Signed)
Tried to call patient but VM not set up.  Will try again later today or tomorrow to reach. Anne Daniels,CMA

## 2015-08-04 NOTE — Telephone Encounter (Signed)
No VM available, line just disconnects after 5 rings.  Will inform patient of provider's decision when she calls back. Taiesha Bovard,CMA

## 2015-08-04 NOTE — Telephone Encounter (Signed)
I refilled her Paxil 37.5 one daily which she was on previously. This is not written for BID dosing. Please inform the patient

## 2015-09-05 ENCOUNTER — Other Ambulatory Visit: Payer: Self-pay | Admitting: Student

## 2015-09-05 ENCOUNTER — Other Ambulatory Visit: Payer: Self-pay | Admitting: *Deleted

## 2015-09-05 MED ORDER — OMEPRAZOLE 40 MG PO CPDR
40.0000 mg | DELAYED_RELEASE_CAPSULE | Freq: Every day | ORAL | Status: DC
Start: 2015-09-05 — End: 2016-08-04

## 2015-09-05 MED ORDER — GABAPENTIN 600 MG PO TABS: 600.0000 mg | ORAL_TABLET | Freq: Three times a day (TID) | ORAL | Status: DC

## 2015-09-06 ENCOUNTER — Other Ambulatory Visit: Payer: Self-pay | Admitting: Student

## 2015-09-21 ENCOUNTER — Other Ambulatory Visit: Payer: Self-pay | Admitting: Student

## 2016-02-27 ENCOUNTER — Encounter: Payer: Self-pay | Admitting: Internal Medicine

## 2016-02-27 ENCOUNTER — Other Ambulatory Visit (HOSPITAL_COMMUNITY)
Admission: RE | Admit: 2016-02-27 | Discharge: 2016-02-27 | Disposition: A | Discharge status: Home / Self Care | Payer: 59 | Source: Ambulatory Visit | Attending: Family Medicine | Admitting: Family Medicine

## 2016-02-27 ENCOUNTER — Ambulatory Visit (INDEPENDENT_AMBULATORY_CARE_PROVIDER_SITE_OTHER): Payer: 59 | Admitting: Internal Medicine

## 2016-02-27 VITALS — BP 102/70 | HR 70 | Temp 98.5°F | Ht 67.0 in | Wt 276.8 lb

## 2016-02-27 DIAGNOSIS — Z1151 Encounter for screening for human papillomavirus (HPV): Secondary | ICD-10-CM | POA: Diagnosis not present

## 2016-02-27 DIAGNOSIS — Z01411 Encounter for gynecological examination (general) (routine) with abnormal findings: Secondary | ICD-10-CM | POA: Diagnosis present

## 2016-02-27 DIAGNOSIS — Z124 Encounter for screening for malignant neoplasm of cervix: Secondary | ICD-10-CM | POA: Diagnosis not present

## 2016-02-27 DIAGNOSIS — Z23 Encounter for immunization: Secondary | ICD-10-CM | POA: Diagnosis not present

## 2016-02-27 DIAGNOSIS — E119 Type 2 diabetes mellitus without complications: Secondary | ICD-10-CM

## 2016-02-27 LAB — POCT GLYCOSYLATED HEMOGLOBIN (HGB A1C): Hemoglobin A1C: 7

## 2016-02-27 MED ORDER — METFORMIN HCL 1000 MG PO TABS: 1000.0000 mg | ORAL_TABLET | Freq: Two times a day (BID) | ORAL | 0 refills | Status: DC

## 2016-02-27 NOTE — Patient Instructions (Addendum)
Thank you so much for coming in today!   We will call you with the results of your PAP smear from today.   You are due for a diabetic eye exam. Please schedule to have this done.   I recommend that all women start having mammograms to screen for breast cancer starting at the age of 6. Please go at your earliest convenience to the breast center to have this done.   Your A1C has increased from 6.1 to 7.0. This is likely due to the lifestyle change we talked about. I am proud of you for starting yoga twice per week and I highly encourage you to join a gym! Try to walk some or at least stand throughout the day and on those very short breaks you have at work :)   Increase your Metformin to 1000 mg twice per day. I have sent a new prescription to your pharmacy.

## 2016-02-27 NOTE — Progress Notes (Signed)
Subjective:    Anne Daniels - 41 y.o. female MRN [id number]  Date of birth: 08-24-1975  HPI  ROBERTO ARNOTT is here for annual exam.  T2DM: Does not check CBGs at home. Has been on Metformin 500 mg BID for several years without GI side effects. In August, she switched jobs. Was previously working at [employer] and was standing all day and walking throughout the work day. Now works for [employer], and sits at desk throughout the day. She gets two 15 minute breaks and a 30 minute lunch break. She has noted approximately a 15 lb weight gain since switching jobs. She recently started yoga twice per week and is considering joining [employer] or [employer]. Patient has diabetic eye exam scheduled for next week.   Well Woman  No breast lumps, tenderness, or nipple discharge.  Periods: Regular.  Contraception: OCPs. Does not smoke and no history of DVTs.  Pelvic symptoms: None  Sexual activity: Active with husband  STD Screening: Declines  Pap smear status: Overdue. Last completed in 2010 and was normal. Denies history of abnormal Paps.   Smoking: None  Alcohol: None  Drugs: None  No family history of cancer.   ROS Patient reports no  vision/ hearing changes,anorexia, weight change, fever ,adenopathy, persistant / recurrent hoarseness, swallowing issues, chest pain, edema,persistant / recurrent cough, hemoptysis, dyspnea(rest, exertional, paroxysmal nocturnal), gastrointestinal  bleeding (melena, rectal bleeding), abdominal pain, excessive heart burn, GU symptoms(dysuria, hematuria, pyuria, voiding/incontinence  Issues) syncope, focal weakness, severe memory loss, concerning skin lesions, depression, anxiety, abnormal bruising/bleeding, major joint swelling, breast masses or abnormal vaginal bleeding.         Health Maintenance Due  Topic Date Due  . PNEUMOCOCCAL POLYSACCHARIDE VACCINE (1) 11/28/1977  . FOOT EXAM  11/28/1985  . HIV Screening  11/29/1990  . PAP SMEAR   08/14/2011  . INFLUENZA VACCINE  09/16/2015  . OPHTHALMOLOGY EXAM  12/04/2015  . HEMOGLOBIN A1C  01/11/2016    -  reports that she has never smoked. She has never used smokeless tobacco. - Review of Systems: Per HPI. - Past Medical History: Patient Active Problem List   Diagnosis Date Noted  . Screening for cervical cancer 02/27/2016  . Neuropathy, diabetic ([place]) 05/21/2014  . Abnormal nuclear stress test   . Abnormal uterine bleeding 12/14/2011  . Obstructive sleep apnea 04/16/2011  . HOT FLASHES 10/17/2009  . ROTATOR CUFF INJURY, LEFT SHOULDER 02/27/2009  . CERVICAL RADICULOPATHY, LEFT 02/21/2009  . Depression, unipolar ([place]) 09/03/2008  . TRANSAMINASES, SERUM, ELEVATED 06/26/2008  . POSTURAL HYPOTENSION 04/02/2008  . Diabetes mellitus without complication ([place]) [id number]  . DISORDER, DYSMETABOLIC SYNDROME X [id number]  . SPRAIN/STRAIN, LUMBAR REGION 09/15/2006  . HYPERTRIGLYCERIDEMIA 07/05/2006  . KNEE PAIN, LEFT, CHRONIC 06/02/2006  . OBESITY, NOS 04/14/2006   - Medications: reviewed and updated   Objective:   Physical Exam BP 102/70   Pulse 70   Temp 98.5 F (36.9 C) (Oral)   Ht 5\' 7"  (1.702 m)   Wt 276 lb 12.8 oz (125.6 kg)   LMP 02/18/2016   SpO2 96%   BMI 43.35 kg/m  Gen: NAD, alert, cooperative with exam, well-appearing HEENT: NCAT, PERRL, clear conjunctiva, oropharynx clear, supple neck CV: RRR, good S1/S2, no murmur, no edema, capillary refill brisk  Resp: CTABL, no wheezes, non-labored Abd: SNTND, BS present, no guarding or organomegaly Skin: no rashes, normal turgor  Neuro: no gross deficits.  Psych: good insight, alert and oriented GU/GYN: Exam performed in the  presence of a chaperone. External genitalia within normal limits.  Vaginal mucosa pink, moist, normal rugae.  Nonfriable cervix without lesions, no discharge or bleeding noted on speculum exam.  Bimanual exam revealed normal, nongravid uterus.  No cervical motion tenderness. No adnexal masses  bilaterally.    Diabetic Foot Check -  Appearance - no lesions, ulcers or calluses Skin - no unusual pallor or redness Monofilament testing - normal bilaterally  Right - Great toe, medial, central, lateral ball and posterior foot intact Left - Great toe, medial, central, lateral ball and posterior foot intact    Assessment & Plan:   Diabetes mellitus without complication (HCC) Worsening of A1C from 6.1 to 7.0 over a period of 9 months. Previous A1Cs have been well controlled. Suspect worsening related to activity level change with new job.  -will increase Metformin to 1000 mg BID, discussed potential GI side effects -patient to join gym and continue with exercise plan  -encouraged standing at work and walking on her breaks if possible  -could consider nutrition consult  -f/u in 3 months for repeat A1C  -pt to get diabetic eye exam next week  -DM foot exam performed today   Screening for cervical cancer PAP with HPV co-testing performed today.   Health Maintenance  -PSV 23 given today due to DM  -flu vaccine given today  -mammogram sheet given today   Phill Myron, D.O. 02/27/2016, 2:51 PM PGY-2, [place]

## 2016-02-27 NOTE — Addendum Note (Signed)
Addended by: Katharina Caper, Lynette Noah D on: 02/27/2016 03:55 PM   Modules accepted: Orders

## 2016-02-27 NOTE — Assessment & Plan Note (Signed)
PAP with HPV co-testing performed today.

## 2016-02-27 NOTE — Assessment & Plan Note (Addendum)
Worsening of A1C from 6.1 to 7.0 over a period of 9 months. Previous A1Cs have been well controlled. Suspect worsening related to activity level change with new job.  -will increase Metformin to 1000 mg BID, discussed potential GI side effects -patient to join gym and continue with exercise plan  -encouraged standing at work and walking on her breaks if possible  -could consider nutrition consult  -f/u in 3 months for repeat A1C  -pt to get diabetic eye exam next week  -DM foot exam performed today

## 2016-03-03 LAB — CYTOLOGY - PAP
Diagnosis: NEGATIVE
HPV: NOT DETECTED

## 2016-03-18 ENCOUNTER — Other Ambulatory Visit: Payer: Self-pay | Admitting: Student

## 2016-04-07 ENCOUNTER — Other Ambulatory Visit: Payer: Self-pay | Admitting: Student

## 2016-05-28 ENCOUNTER — Ambulatory Visit: Payer: 59 | Admitting: Internal Medicine

## 2016-06-05 ENCOUNTER — Other Ambulatory Visit: Payer: Self-pay | Admitting: Internal Medicine

## 2016-06-17 ENCOUNTER — Encounter: Payer: Self-pay | Admitting: Internal Medicine

## 2016-06-17 ENCOUNTER — Ambulatory Visit (INDEPENDENT_AMBULATORY_CARE_PROVIDER_SITE_OTHER): Payer: 59 | Admitting: Internal Medicine

## 2016-06-17 VITALS — BP 122/82 | HR 78 | Temp 98.2°F | Ht 67.0 in | Wt 274.8 lb

## 2016-06-17 DIAGNOSIS — G4700 Insomnia, unspecified: Secondary | ICD-10-CM

## 2016-06-17 DIAGNOSIS — E119 Type 2 diabetes mellitus without complications: Secondary | ICD-10-CM | POA: Diagnosis not present

## 2016-06-17 LAB — POCT GLYCOSYLATED HEMOGLOBIN (HGB A1C): HEMOGLOBIN A1C: 6.7

## 2016-06-17 MED ORDER — GLUCOSE BLOOD VI STRP: ORAL_STRIP | 3 refills | Status: DC

## 2016-06-17 MED ORDER — TRAZODONE HCL 50 MG PO TABS: 25.0000 mg | ORAL_TABLET | Freq: Every evening | ORAL | 3 refills | Status: DC | PRN

## 2016-06-17 MED ORDER — ONETOUCH DELICA LANCETS 33G MISC: 0 refills | Status: DC

## 2016-06-17 MED ORDER — ONETOUCH VERIO W/DEVICE KIT: 1.0000 | PACK | 0 refills | Status: DC

## 2016-06-17 MED ORDER — ONETOUCH DELICA LANCING DEV MISC: 1.0000 | 1 refills | Status: DC

## 2016-06-17 NOTE — Progress Notes (Signed)
Subjective:    Anne Daniels - 41 y.o. female MRN [id number]  Date of birth: 20-Apr-1975  HPI  Anne Daniels is here for follow up of T2DM.  T2DM: At last OV increased Metformin from 500 mg BID to 1000 mg BID due to A1c increase of 6.1 to 7.0. Reports she has been compliant with this medication increase. She has eliminated sodas and juice from her diet and is now only drinking water and unsweetened tea. Acknowledges that she has lost a couple pounds since her last visit, but feels like she could do more. Is under a lot of stress at home (husband has drinking problem, she is about to meet her step-son for the first time this weekend) and is afraid that has affected her diabetes adversely. She does not monitor her CBGs at home and reports her meter is very old and unreliable. She wishes to start checking CBGs and asks for Rx today. Had diabetic eye exam scheduled but appointment was due to emergency with the doctor. She is on the wait list for a re-schedule.    Insomnia: Has been having problems sleeping for about the past 6 months. Goes to bed at the same time every night but lays there for 1-2 hours before falling asleep. Usually sleeping less than 4 hours at a time and then will have nighttime awakenings. Not napping frequently during the day but sometimes she has to nap to make up for nighttime sleep deficit. Reports problems turning off her thoughts as cause of her not sleeping well. She has tried Ambien in the past and did not like how she felt the morning after and is concerned about Ambien's potential side effects. However, she does feel like she needs some type of sleep aide.    -  reports that she has never smoked. She has never used smokeless tobacco. - Review of Systems: Per HPI. - Past Medical History: Patient Active Problem List   Diagnosis Date Noted  . Screening for cervical cancer 02/27/2016  . Neuropathy, diabetic ([place]) 05/21/2014  . Abnormal nuclear stress test   .  Abnormal uterine bleeding 12/14/2011  . Obstructive sleep apnea 04/16/2011  . HOT FLASHES 10/17/2009  . ROTATOR CUFF INJURY, LEFT SHOULDER 02/27/2009  . CERVICAL RADICULOPATHY, LEFT 02/21/2009  . Depression, unipolar ([place]) 09/03/2008  . TRANSAMINASES, SERUM, ELEVATED 06/26/2008  . POSTURAL HYPOTENSION 04/02/2008  . Diabetes mellitus without complication ([place]) 76/19/5093  . DISORDER, DYSMETABOLIC SYNDROME X 26/71/2458  . SPRAIN/STRAIN, LUMBAR REGION 09/15/2006  . HYPERTRIGLYCERIDEMIA 07/05/2006  . KNEE PAIN, LEFT, CHRONIC 06/02/2006  . OBESITY, NOS 04/14/2006   - Medications: reviewed and updated   Objective:   Physical Exam BP 122/82   Pulse 78   Temp 98.2 F (36.8 C) (Oral)   Ht _0  (1.702 m)   Wt 274 lb 12.8 oz (124.6 kg)   LMP 06/14/2016 (Approximate)   SpO2 99%   BMI 43.04 kg/m  Gen: NAD, alert, cooperative with exam, well-appearing CV: RRR, good S1/S2, no murmur, no edema, capillary refill brisk  Resp: CTABL, no wheezes, non-labored    Assessment & Plan:   1. Type 2 diabetes mellitus without complication, without long-term current use of insulin (HCC) Improvement in A1c from 7.0 to 6.7 with diet changes and increase in Metformin. Encouraged continued lifestyle changes and weight loss. Patient wishes to start checking her CBGs at home. Have prescribed supplies and discussed that since she is well controlled she could check one to two fasting CBGs per  week just to catch any upward trending of CBGs. Patient on Losartan for renal protection so will not check urine microalbumin level. Will check BMET today.  - HgB E3T - Basic Metabolic Panel - Blood Glucose Monitoring Suppl (ONETOUCH VERIO) w/Device KIT; 1 Device by Does not apply route once a week.  Dispense: 1 kit; Refill: 0 - glucose blood (ONETOUCH VERIO) test strip; Check blood sugar 6 x daily  Dispense: 25 each; Refill: 3 - Lancet Devices (ONE TOUCH DELICA LANCING DEV) MISC; 1 each by Does not apply route once a week.   Dispense: 1 each; Refill: 1 - ONETOUCH DELICA LANCETS 53U MISC; Use as directed  Dispense: 100 each; Refill: 0  2. Insomnia, unspecified type Suspect related to life stressors. Discussed sleep hygiene tactics. May also benefit from [facility] in the future.  - traZODone (DESYREL) 50 MG tablet; Take 0.5-1 tablets (25-50 mg total) by mouth at bedtime as needed for sleep.  Dispense: 30 tablet; Refill: [place], D.O. 06/17/2016, 10:05 AM PGY-2, [place]

## 2016-06-17 NOTE — Assessment & Plan Note (Addendum)
Improvement in A1c from 7.0 to 6.7 with diet changes and increase in Metformin. Encouraged continued lifestyle changes and weight loss. Patient wishes to start checking her CBGs at home. Have prescribed supplies and discussed that since she is well controlled she could check one to two fasting CBGs per week just to catch any upward trending of CBGs. Patient on Losartan for renal protection so will not check urine microalbumin level. Will check BMET today.  - HgB R9U - Basic Metabolic Panel - Blood Glucose Monitoring Suppl (ONETOUCH VERIO) w/Device KIT; 1 Device by Does not apply route once a week.  Dispense: 1 kit; Refill: 0 - glucose blood (ONETOUCH VERIO) test strip; Check blood sugar 6 x daily  Dispense: 25 each; Refill: 3 - Lancet Devices (ONE TOUCH DELICA LANCING DEV) MISC; 1 each by Does not apply route once a week.  Dispense: 1 each; Refill: 1 - ONETOUCH DELICA LANCETS 34Z MISC; Use as directed  Dispense: 100 each; Refill: 0

## 2016-06-17 NOTE — Patient Instructions (Signed)
I have prescribed Trazodone for your insomnia. Start with 1/2 tablet 45min-1hour prior to bedtime. You can increase to a full tablet if needed.   Keep up the good work with your Diabetes! You have made some great lifestyle changes and I am proud of you. Please return in 3 months for a follow up.     Good sleep habits (sometimes referred to as "sleep hygiene") can help you get a good night's sleep. Some habits that can improve your sleep health:  Be consistent. Go to bed at the same time each night and get up at the same time each morning, including on the weekends Make sure your bedroom is quiet, dark, relaxing, and at a comfortable temperature Remove electronic devices, such as TVs, computers, and smart phones, from the bedroom Avoid large meals, caffeine, and alcohol before bedtime Get some exercise. Being physically active during the day can help you fall asleep more easily at night.

## 2016-06-18 LAB — BASIC METABOLIC PANEL WITH GFR
BUN/Creatinine Ratio: 8 — ABNORMAL LOW (ref 9–23)
BUN: 7 mg/dL (ref 6–24)
CO2: 22 mmol/L (ref 18–29)
Calcium: 8.9 mg/dL (ref 8.7–10.2)
Chloride: 103 mmol/L (ref 96–106)
Creatinine, Ser: 0.83 mg/dL (ref 0.57–1.00)
GFR calc Af Amer: 102 mL/min/{1.73_m2}
GFR calc non Af Amer: 88 mL/min/{1.73_m2}
Glucose: 110 mg/dL — ABNORMAL HIGH (ref 65–99)
Potassium: 4.4 mmol/L (ref 3.5–5.2)
Sodium: 140 mmol/L (ref 134–144)

## 2016-06-21 ENCOUNTER — Telehealth: Payer: Self-pay

## 2016-06-21 NOTE — Telephone Encounter (Signed)
Pt informed. Anne Daniels, CMA  

## 2016-06-21 NOTE — Telephone Encounter (Signed)
-----   Message from Nicolette Bang, DO sent at 06/18/2016  6:13 PM EDT ----- Please call patient to let her know that her basic labs looking at kidneys and electrolytes were normal.   Phill Myron, D.O. 06/18/2016, 6:13 PM PGY-2, [place]

## 2016-08-01 ENCOUNTER — Other Ambulatory Visit: Payer: Self-pay | Admitting: Student

## 2016-08-04 ENCOUNTER — Other Ambulatory Visit: Payer: Self-pay | Admitting: Student

## 2016-08-16 ENCOUNTER — Telehealth: Payer: Self-pay | Admitting: *Deleted

## 2016-08-16 MED ORDER — NORETHINDRONE-ETH ESTRADIOL 1-35 MG-MCG PO TABS: 1.0000 | ORAL_TABLET | Freq: Every day | ORAL | 11 refills | Status: DC

## 2016-08-16 NOTE — Telephone Encounter (Signed)
Rx sent as generic.  

## 2016-08-16 NOTE — Telephone Encounter (Signed)
Received fax from [facility] stating Dasetta 1/35 tablets are no longer available. Please change to a different medication.  Derl Barrow, RN

## 2016-09-22 ENCOUNTER — Ambulatory Visit (INDEPENDENT_AMBULATORY_CARE_PROVIDER_SITE_OTHER): Payer: 59 | Admitting: Internal Medicine

## 2016-09-22 VITALS — BP 130/80 | HR 82 | Temp 98.8°F | Wt 271.0 lb

## 2016-09-22 DIAGNOSIS — R112 Nausea with vomiting, unspecified: Secondary | ICD-10-CM | POA: Diagnosis not present

## 2016-09-22 NOTE — Progress Notes (Signed)
[facility] Progress Note  Subjective:  Anne Daniels is a 41 y.o. female with history of T2DM who presents for vomiting. Began yesterday evening. Patient was in her usual state of health until the afternoon when she felt a little unwell and nauseated and took a nap after work. She had [place] food for lunch and wonders if she might have food poisoning. She vomited multiple times over nights and only twice today. She has been able to keep sprite down. No recent travel. Did get a kitten 3 weeks ago. No sick contacts. CBG this morning 116. Denies abdominal pain but has had an unsettled feeling. ROS: Does not think she has had fever. No diarrhea.    Allergies  Allergen Reactions  . Penicillins Anaphylaxis    Objective: Blood pressure 130/80, pulse 82, temperature 98.8 F (37.1 C), weight 271 lb (122.9 kg), SpO2 98 %. Body mass index is 42.44 kg/m. Constitutional: Obese female, pleasant, in NAD HENT: MMM Cardiovascular: RRR, S1, S2, no m/r/g.  Pulmonary/Chest: Effort normal and breath sounds normal. No respiratory distress.  Abdominal: Soft. +BS, NT, ND Neurological: AOx3, no focal deficits. Skin: Slightly clammy.   Psychiatric: Normal mood and affect.  Vitals reviewed  Assessment/Plan: Non-intractable vomiting with nausea - Vomiting since yesterday evening. Lack of fever, lack of diarrhea, and quick slowing down of symptoms makes food poisoning more likely than gastroenteritis. No hyperglycemia to suggest DKA.  - Called in rx for zofran in case emesis becomes more frequent again - Advised bland diet as improves  Follow-up prn.  Olene Floss, MD [place], PGY-3

## 2016-09-24 DIAGNOSIS — R1110 Vomiting, unspecified: Secondary | ICD-10-CM | POA: Insufficient documentation

## 2016-09-24 NOTE — Patient Instructions (Signed)
Computer system down during visit.

## 2016-09-24 NOTE — Assessment & Plan Note (Signed)
-   Vomiting since yesterday evening. Lack of fever, lack of diarrhea, and quick slowing down of symptoms makes food poisoning more likely than gastroenteritis. No hyperglycemia to suggest DKA.  - Called in rx for zofran in case emesis becomes more frequent again - Advised bland diet as improves

## 2016-10-14 ENCOUNTER — Other Ambulatory Visit: Payer: Self-pay | Admitting: *Deleted

## 2016-10-15 MED ORDER — PROPRANOLOL HCL 20 MG PO TABS: 20.0000 mg | ORAL_TABLET | Freq: Two times a day (BID) | ORAL | 1 refills | Status: DC

## 2016-10-15 NOTE — Telephone Encounter (Signed)
Patient is due for DM follow up, can she please be called to schedule appt. Also can she please bring in home meds are med rec thank you.

## 2016-10-15 NOTE — Telephone Encounter (Signed)
LM for patient to call back.  Will try to contact her again later today. Jazmin Hartsell,CMA

## 2016-10-15 NOTE — Telephone Encounter (Signed)
Woman answered the phone and states that we have the wrong number. Jazmin Hartsell,CMA

## 2016-12-11 ENCOUNTER — Other Ambulatory Visit: Payer: Self-pay | Admitting: Family Medicine

## 2016-12-13 NOTE — Telephone Encounter (Signed)
LM for patient on her spouse's number. Jazmin Hartsell,CMA

## 2016-12-13 NOTE — Telephone Encounter (Signed)
Will give 30 day refill but patient is overdue for DM appointment and no record of why patient is on propanolol. She needs follow up and should bring all of her home meds at that time for med rec. Can she please be called.

## 2016-12-13 NOTE — Telephone Encounter (Signed)
Will try to reach patient through her emergency contacts.  The only number listed is her work number. Draven Laine,CMA

## 2016-12-14 ENCOUNTER — Encounter: Payer: Self-pay | Admitting: *Deleted

## 2016-12-14 NOTE — Telephone Encounter (Signed)
Letter mailed to patient. Jazmin Hartsell,CMA  

## 2016-12-31 ENCOUNTER — Ambulatory Visit: Payer: 59 | Admitting: Internal Medicine

## 2017-01-10 ENCOUNTER — Other Ambulatory Visit: Payer: Self-pay | Admitting: Family Medicine

## 2017-01-10 NOTE — Telephone Encounter (Signed)
As per my refill note on 12/11/16:  patient is overdue for DM appointment and no record of why patient is on propanolol. She needs follow up and should bring all of her home meds at that time for med rec. Will give another 30 d refill today as we had difficulty contacting her. Can she please be called to schedule appt

## 2017-01-10 NOTE — Telephone Encounter (Signed)
Patient has an appointment with Dr. Juleen China on 01/21/17. Jazmin Hartsell,CMA

## 2017-01-21 ENCOUNTER — Ambulatory Visit: Payer: 59 | Admitting: Internal Medicine

## 2017-02-04 ENCOUNTER — Other Ambulatory Visit: Payer: Self-pay | Admitting: Family Medicine

## 2017-02-04 NOTE — Telephone Encounter (Signed)
Patient states that she did have an appointment on 12/12 but had to cancel.  Patient rescheduled for 02/25/17 with Dr. Juleen China per patient's choice.  Yehya Brendle,CMA

## 2017-02-04 NOTE — Telephone Encounter (Signed)
As per my refill note on 12/11/16 and 01/10/17:  patient is overdue for DM appointment and no record of why patient is on propanolol. She needs follow up and should bring all of her home meds at that time for med rec. Will give another 30 d refill today as unfortunately she called her last appt.

## 2017-02-10 ENCOUNTER — Other Ambulatory Visit: Payer: Self-pay | Admitting: Family Medicine

## 2017-02-10 DIAGNOSIS — E119 Type 2 diabetes mellitus without complications: Secondary | ICD-10-CM

## 2017-02-10 MED ORDER — ASPIRIN EC 81 MG PO TBEC: 81.0000 mg | DELAYED_RELEASE_TABLET | Freq: Every day | ORAL | 3 refills | Status: DC

## 2017-02-10 MED ORDER — GLUCOSE BLOOD VI STRP: ORAL_STRIP | 3 refills | Status: DC

## 2017-02-10 MED ORDER — ONETOUCH DELICA LANCETS 33G MISC: 0 refills | Status: DC

## 2017-02-10 NOTE — Telephone Encounter (Signed)
Received faxed refill request for ASA81, lancets and strips. Rx sent to pharmacy.  Request was also for additional medications :triamterene/HCTZ 70/50, jardiance 10mg  or isosorbide dinitrate 10mg .  No record of patient being on these medications in her chart. Defer to Dr Juleen China as she is seeing this patient on 02/25/17 as per patient request.

## 2017-02-25 ENCOUNTER — Other Ambulatory Visit: Payer: Self-pay

## 2017-02-25 ENCOUNTER — Ambulatory Visit (INDEPENDENT_AMBULATORY_CARE_PROVIDER_SITE_OTHER): Payer: 59 | Admitting: Internal Medicine

## 2017-02-25 ENCOUNTER — Encounter: Payer: Self-pay | Admitting: Internal Medicine

## 2017-02-25 VITALS — BP 126/84 | HR 93 | Temp 98.1°F | Ht 67.0 in | Wt 271.4 lb

## 2017-02-25 DIAGNOSIS — N946 Dysmenorrhea, unspecified: Secondary | ICD-10-CM

## 2017-02-25 DIAGNOSIS — E119 Type 2 diabetes mellitus without complications: Secondary | ICD-10-CM

## 2017-02-25 DIAGNOSIS — Z23 Encounter for immunization: Secondary | ICD-10-CM | POA: Diagnosis not present

## 2017-02-25 DIAGNOSIS — R102 Pelvic and perineal pain: Secondary | ICD-10-CM

## 2017-02-25 DIAGNOSIS — N920 Excessive and frequent menstruation with regular cycle: Secondary | ICD-10-CM | POA: Diagnosis not present

## 2017-02-25 LAB — POCT URINE PREGNANCY: Preg Test, Ur: NEGATIVE

## 2017-02-25 LAB — POCT GLYCOSYLATED HEMOGLOBIN (HGB A1C): HEMOGLOBIN A1C: 6.8

## 2017-02-25 NOTE — Progress Notes (Signed)
   Subjective:    Anne Daniels - 42 y.o. female MRN [id number]  Date of birth: 1975-09-23  HPI  Anne Daniels is here for follow up of diabetes.  T2DM: Taking Metformin 1000 mg BID. Checks CBGs about twice per week. Average fasting number is around 120. She continues to try to make better diet choices.   Dysmenorrhea: Reports several year (5+) history of pain the week prior to menstrual period and sometimes associated during the menstrual period as well. Pelvic pain has continued to worsen to the point that she now sometimes has to call out of work. Periods vary in duration anywhere from 2 days to 10 days. Periods are usually heavy reporting that she will go through 28 pads during a longer lasting period. She is sexually active with her husband. Uses OCPs which she has taken since she was a teenager. No desire to have children. She denies vaginal discharge, dysuria, changes in urination, and fevers.   -  reports that  has never smoked. she has never used smokeless tobacco. - Review of Systems: Per HPI. - Past Medical History: Patient Active Problem List   Diagnosis Date Noted  . Dysmenorrhea 02/25/2017  . Non-intractable vomiting with nausea 09/24/2016  . Screening for cervical cancer 02/27/2016  . Neuropathy, diabetic ([place]) 05/21/2014  . Abnormal nuclear stress test   . Abnormal uterine bleeding 12/14/2011  . Obstructive sleep apnea 04/16/2011  . HOT FLASHES 10/17/2009  . ROTATOR CUFF INJURY, LEFT SHOULDER 02/27/2009  . CERVICAL RADICULOPATHY, LEFT 02/21/2009  . Depression, unipolar ([place]) 09/03/2008  . TRANSAMINASES, SERUM, ELEVATED 06/26/2008  . POSTURAL HYPOTENSION 04/02/2008  . Diabetes mellitus without complication ([place]) 40/98/1191  . DISORDER, DYSMETABOLIC SYNDROME X 47/82/9562  . SPRAIN/STRAIN, LUMBAR REGION 09/15/2006  . HYPERTRIGLYCERIDEMIA 07/05/2006  . KNEE PAIN, LEFT, CHRONIC 06/02/2006  . OBESITY, NOS 04/14/2006   - Medications: reviewed and updated     Objective:   Physical Exam BP 126/84   Pulse 93   Temp 98.1 F (36.7 C) (Oral)   Ht 5\' 7"  (1.702 m)   Wt 271 lb 6.4 oz (123.1 kg)   LMP 02/03/2017 (Exact Date)   SpO2 98%   BMI 42.51 kg/m  Gen: NAD, alert, cooperative with exam, well-appearing CV: RRR, good S1/S2, no murmur, no edema, capillary refill brisk  Resp: CTABL, no wheezes, non-labored GU/GYN: Declined.   Assessment & Plan:   Diabetes mellitus without complication (HCC) Z3Y has remained stable at 6.8%. Encouraged continued lifestyle changes and weight loss. Has lost 5lbs within the past year. Continue with Metformin 1000 mg BID and monitoring of CBGs two times per week. Patient on Losartan for renal protection.   Dysmenorrhea Chronic and worsening. Upreg negative. May be related to leiomyomas vs. Adenomyosis vs. Endometriosis. Symptoms not regulated with OCP. Had transvaginal US in 2013 that was unremarkable. Will repeat ultrasound today. Patient ultimately wishes to discuss option of hysterectomy for symptom control. Therefore, will refer to OB-GYN. Checking CBC today as patient endorses menorrhagia as well.     Phill Myron, D.O. 02/25/2017, 2:44 PM PGY-3, [place]

## 2017-02-25 NOTE — Assessment & Plan Note (Signed)
A1c has remained stable at 6.8%. Encouraged continued lifestyle changes and weight loss. Has lost 5lbs within the past year. Continue with Metformin 1000 mg BID and monitoring of CBGs two times per week. Patient on Losartan for renal protection.

## 2017-02-25 NOTE — Assessment & Plan Note (Addendum)
Chronic and worsening. Upreg negative. May be related to leiomyomas vs. Adenomyosis vs. Endometriosis. Symptoms not regulated with OCP. Had transvaginal US in 2013 that was unremarkable. Will repeat ultrasound today. Patient ultimately wishes to discuss option of hysterectomy for symptom control. Therefore, will refer to OB-GYN. Checking CBC today as patient endorses menorrhagia as well.

## 2017-02-25 NOTE — Patient Instructions (Addendum)
Great work on your diabetes! I am proud of you!   I have placed the referral to Ob-GYN and asked for you to be scheduled at [facility]. We will get the pelvic ultrasound in the meantime. We will check blood counts today since you have had heavy menstrual periods. You can use NSAIDs for pain and to attempt to reduce amount of menstrual blood.

## 2017-02-26 LAB — CBC
HEMOGLOBIN: 14.6 g/dL (ref 11.1–15.9)
Hematocrit: 44.4 % (ref 34.0–46.6)
MCH: 30.7 pg (ref 26.6–33.0)
MCHC: 32.9 g/dL (ref 31.5–35.7)
MCV: 94 fL (ref 79–97)
PLATELETS: 229 10*3/uL (ref 150–379)
RBC: 4.75 x10E6/uL (ref 3.77–5.28)
RDW: 13.4 % (ref 12.3–15.4)
WBC: 6.3 10*3/uL (ref 3.4–10.8)

## 2017-02-28 ENCOUNTER — Telehealth: Payer: Self-pay

## 2017-02-28 NOTE — Telephone Encounter (Signed)
PCLM for a return call to the office. If patient calls, please let her know her blood counts were within normal limits per Dr. Juleen China. Ottis Stain, CMA

## 2017-02-28 NOTE — Telephone Encounter (Signed)
-----   Message from Nicolette Bang, DO sent at 02/26/2017 10:52 AM EST ----- Please call patient to let her know that blood counts were within normal limits.   Phill Myron, D.O. 02/26/2017, 10:52 AM PGY-3, [place]

## 2017-03-01 ENCOUNTER — Other Ambulatory Visit: Payer: 59

## 2017-03-20 ENCOUNTER — Other Ambulatory Visit: Payer: Self-pay | Admitting: Internal Medicine

## 2017-03-21 ENCOUNTER — Other Ambulatory Visit: Payer: Self-pay | Admitting: Family Medicine

## 2017-03-21 ENCOUNTER — Other Ambulatory Visit: Payer: Self-pay

## 2017-03-21 DIAGNOSIS — E119 Type 2 diabetes mellitus without complications: Secondary | ICD-10-CM

## 2017-03-22 MED ORDER — LOSARTAN POTASSIUM 25 MG PO TABS: 25.0000 mg | ORAL_TABLET | Freq: Every day | ORAL | 3 refills | Status: DC

## 2017-04-01 ENCOUNTER — Other Ambulatory Visit: Payer: 59

## 2017-04-25 ENCOUNTER — Other Ambulatory Visit: Payer: Self-pay | Admitting: Internal Medicine

## 2017-06-15 ENCOUNTER — Other Ambulatory Visit: Payer: Self-pay

## 2017-06-17 MED ORDER — METFORMIN HCL 1000 MG PO TABS: ORAL_TABLET | ORAL | 3 refills | Status: DC

## 2017-08-12 ENCOUNTER — Ambulatory Visit (INDEPENDENT_AMBULATORY_CARE_PROVIDER_SITE_OTHER): Payer: 59 | Admitting: Family Medicine

## 2017-08-12 ENCOUNTER — Other Ambulatory Visit: Payer: Self-pay

## 2017-08-12 VITALS — BP 140/78 | HR 85 | Temp 97.9°F | Wt 277.0 lb

## 2017-08-12 DIAGNOSIS — R399 Unspecified symptoms and signs involving the genitourinary system: Secondary | ICD-10-CM | POA: Diagnosis not present

## 2017-08-12 LAB — POCT UA - MICROSCOPIC ONLY

## 2017-08-12 MED ORDER — CEPHALEXIN 250 MG PO CAPS: 250.0000 mg | ORAL_CAPSULE | Freq: Four times a day (QID) | ORAL | 0 refills | Status: AC

## 2017-08-12 NOTE — Patient Instructions (Signed)
It was nice to see you today!   I have sent in keflex for you to take, please take this every 6 hours for 7 days. If you develop a rash please call us and we'll switch antibiotics If you have any worsening (high fevers, inability to eat/drink, kidney pain) please return to be seen.   I'll send the urine for culture and we'll call you if we need to change antibiotics.  If you have questions or concerns please do not hesitate to call at [phone].  Lucila Maine, DO PGY-2, [facility] 08/12/2017 2:27 PM    Urinary Tract Infection, Adult A urinary tract infection (UTI) is an infection of any part of the urinary tract, which includes the kidneys, ureters, bladder, and urethra. These organs make, store, and get rid of urine in the body. UTI can be a bladder infection (cystitis) or kidney infection (pyelonephritis). What are the causes? This infection may be caused by fungi, viruses, or bacteria. Bacteria are the most common cause of UTIs. This condition can also be caused by repeated incomplete emptying of the bladder during urination. What increases the risk? This condition is more likely to develop if:  You ignore your need to urinate or hold urine for long periods of time.  You do not empty your bladder completely during urination.  You wipe back to front after urinating or having a bowel movement, if you are female.  You are uncircumcised, if you are female.  You are constipated.  You have a urinary catheter that stays in place (indwelling).  You have a weak defense (immune) system.  You have a medical condition that affects your bowels, kidneys, or bladder.  You have diabetes.  You take antibiotic medicines frequently or for long periods of time, and the antibiotics no longer work well against certain types of infections (antibiotic resistance).  You take medicines that irritate your urinary tract.  You are exposed to chemicals that irritate your  urinary tract.  You are female.  What are the signs or symptoms? Symptoms of this condition include:  Fever.  Frequent urination or passing small amounts of urine frequently.  Needing to urinate urgently.  Pain or burning with urination.  Urine that smells bad or unusual.  Cloudy urine.  Pain in the lower abdomen or back.  Trouble urinating.  Blood in the urine.  Vomiting or being less hungry than normal.  Diarrhea or abdominal pain.  Vaginal discharge, if you are female.  How is this diagnosed? This condition is diagnosed with a medical history and physical exam. You will also need to provide a urine sample to test your urine. Other tests may be done, including:  Blood tests.  Sexually transmitted disease (STD) testing.  If you have had more than one UTI, a cystoscopy or imaging studies may be done to determine the cause of the infections. How is this treated? Treatment for this condition often includes a combination of two or more of the following:  Antibiotic medicine.  Other medicines to treat less common causes of UTI.  Over-the-counter medicines to treat pain.  Drinking enough water to stay hydrated.  Follow these instructions at home:  Take over-the-counter and prescription medicines only as told by your health care provider.  If you were prescribed an antibiotic, take it as told by your health care provider. Do not stop taking the antibiotic even if you start to feel better.  Avoid alcohol, caffeine, tea, and carbonated beverages. They can irritate your  bladder.  Drink enough fluid to keep your urine clear or pale yellow.  Keep all follow-up visits as told by your health care provider. This is important.  Make sure to: ? Empty your bladder often and completely. Do not hold urine for long periods of time. ? Empty your bladder before and after sex. ? Wipe from front to back after a bowel movement if you are female. Use each tissue one time when  you wipe. Contact a health care provider if:  You have back pain.  You have a fever.  You feel nauseous or vomit.  Your symptoms do not get better after 3 days.  Your symptoms go away and then return. Get help right away if:  You have severe back pain or lower abdominal pain.  You are vomiting and cannot keep down any medicines or water. This information is not intended to replace advice given to you by your health care provider. Make sure you discuss any questions you have with your health care provider. Document Released: 11/11/2004 Document Revised: 07/16/2015 Document Reviewed: 12/23/2014 Elsevier Interactive Patient Education  [employer].

## 2017-08-12 NOTE — Progress Notes (Signed)
   Subjective:    Patient ID: Anne Daniels, female    DOB: [birth year], 42 y.o.   MRN: [id number]   CC: UTI symptoms  HPI: Patient complaining of UTI symptoms that started Wednesday evening with burning with urination and urinary frequency. She vomited once Wednesday night but has been eating and drinking normally since without issue. She has tried OTC AZO for dysuria without much improvement in symptoms. She denies fevers or chills, denies flank pain. She has had a kidney infection in the past. She is diabetic. Sugars have been in 120's. She has an allergy to PCN - developed hives with taking penicillin. She denies anaphylaxis to this medication.   Smoking status reviewed- non-smoker  Review of Systems- see HPI   Objective:  BP 140/78   Pulse 85   Temp 97.9 F (36.6 C) (Oral)   Wt 277 lb (125.6 kg)   LMP 07/29/2017   SpO2 95%   BMI 43.38 kg/m  Vitals and nursing note reviewed  General: non-toxic, well nourished, in no acute distress HEENT: normocephalic, MMM Cardiac: RRR, clear S1 and S2, no murmurs, rubs, or gallops Respiratory: clear to auscultation bilaterally, no increased work of breathing Abdomen: obese abdomen. Tender in suprapubic region to palpation. No CVA tenderness. +BS Skin: warm and dry, no rashes noted Neuro: alert and oriented, no focal deficits  Assessment & Plan:    1. UTI symptoms Dipstick not useful due to urine discoloration from azo. Micro with bacteria and WBC. Patient with classic symptoms. No symptoms concerning for pyelonephritis but given history of pyelo plus immunocompromised state will send urine for cx and treat with keflex 250 mg q6 hours for 7 days. She has history of rash w/ PCN and discussed possbility that rash may occur with cephalosporin, advised benadryl and discontinuation if this occurs. Asked her to call if she develops rash and we will switch antibiotic based on culture results. The patient indicates understanding of these issues  and agrees with the plan. - POCT UA - Microscopic Only - Urine Culture   Return if symptoms worsen or fail to improve.   Lucila Maine, DO Family Medicine Resident PGY-2

## 2017-08-14 LAB — URINE CULTURE

## 2017-08-15 ENCOUNTER — Telehealth: Payer: Self-pay | Admitting: Family Medicine

## 2017-08-15 NOTE — Telephone Encounter (Signed)
  Called Ms. Pattillo to check in, urine culture negative. She reports she is feeling better however has noticed a headache with keflex. No rash. Symptoms resolved. Discussed cutting course down to 5 days instead of 7, taking tylenol as needed for headache. She is appreciative of call.  Lucila Maine, DO PGY-2, [facility] 08/15/2017 9:29 AM

## 2017-08-17 NOTE — Telephone Encounter (Signed)
Pt informed. Fleeger, Jessica Dawn, CMA  

## 2017-08-17 NOTE — Telephone Encounter (Signed)
Pt states that last night she started having right kidney pain.   She reports drinking plenty of water and wonders if she needs to change the antibiotic she is on. Will forward to Dr. Alvy Bimler, Salome Spotted, [place]

## 2017-08-17 NOTE — Telephone Encounter (Signed)
  Her urine did not grow any specific bacteria, she was adequately treated with Keflex. Doubt she has kidney infection but if anything she should come in to be seen for re-evaluation. I would not change antibiotic or add a new antibiotic at this time unless there was objective evidence she had ongoing infection that was not treated. Please have her come in if she continues to have pain or has fevers.  Lucila Maine, DO PGY-2, [facility] 08/17/2017 9:40 AM

## 2017-08-24 ENCOUNTER — Ambulatory Visit (INDEPENDENT_AMBULATORY_CARE_PROVIDER_SITE_OTHER): Payer: 59 | Admitting: Family Medicine

## 2017-08-24 ENCOUNTER — Encounter: Payer: Self-pay | Admitting: Family Medicine

## 2017-08-24 VITALS — BP 130/60 | HR 77 | Temp 99.1°F | Ht 66.0 in | Wt 272.8 lb

## 2017-08-24 DIAGNOSIS — R399 Unspecified symptoms and signs involving the genitourinary system: Secondary | ICD-10-CM | POA: Diagnosis not present

## 2017-08-24 LAB — POCT UA - MICROSCOPIC ONLY: Epithelial cells, urine per micros: >20

## 2017-08-24 MED ORDER — SULFAMETHOXAZOLE-TRIMETHOPRIM 800-160 MG PO TABS: 1.0000 | ORAL_TABLET | Freq: Two times a day (BID) | ORAL | 0 refills | Status: AC

## 2017-08-24 MED ORDER — FLUCONAZOLE 150 MG PO TABS: ORAL_TABLET | ORAL | 0 refills | Status: DC

## 2017-08-24 NOTE — Patient Instructions (Addendum)
It was great meeting you today! I am sorry that you have had such a tough time with your UTI. I am switching you over to a medication called bactrim. You will take this two times per day for 10 days. I will also give you diflucan which you can take for your yeast infection. I would take 1 dose, and will give you a second dose to take three days later on the off chance that you still need it.

## 2017-08-25 ENCOUNTER — Encounter: Payer: Self-pay | Admitting: Family Medicine

## 2017-08-25 DIAGNOSIS — R399 Unspecified symptoms and signs involving the genitourinary system: Secondary | ICD-10-CM | POA: Insufficient documentation

## 2017-08-25 HISTORY — DX: Unspecified symptoms and signs involving the genitourinary system: R39.9

## 2017-08-25 NOTE — Assessment & Plan Note (Signed)
Given that patient is now having flank pain and that she did have resolution of symptoms with the keflex it seems that patient's treatment course was not extended long enough to cover for uncomplicated pyelo. Had no growth from her last UA and UA this time not accurate as she had taken azo prior to coming in. Will treat patient like an uncomplicated UTI. Given that she did just have keflex will switch to bactrim. While this has lower coverage percentage that keflex, it still has good activity against causal organisms. Will give a 10 day course bactrim DS bid. Strict return precautions given. - bactrim DS 10 day course - diflucan 150mg  x1, + additional dose if still having symptoms

## 2017-08-25 NOTE — Progress Notes (Signed)
   HPI 42 year old female who comes back to clinic with continued symptoms of itching, painful/burning urination. She was previously seen on 7/1 and was diagnosed with a uti 2/2 to very similar symptoms. She was given a dose of keflex 250mg  qid for 7 days. She states that her symptoms did get better while she was taking the antibiotic but returned a couple of days after completion of her course. She also states that she is now having some right flank pain, that isn't unbearable but she does notice it.  CC: Still having UTI symptoms   ROS:   Review of Systems See HPI for ROS.   CC, SH/smoking status, and VS noted  Objective: BP 130/60 (BP Location: Left Arm, Patient Position: Sitting, Cuff Size: Large)   Pulse 77   Temp 99.1 F (37.3 C) (Oral)   Ht 5\' 6"  (1.676 m)   Wt 272 lb 12.8 oz (123.7 kg)   LMP 07/29/2017   SpO2 97%   BMI 44.03 kg/m  Gen: NAD, alert, cooperative, and pleasant. Obese, caucasian female, resting comfortably HEENT: NCAT, EOMI, PERRL CV: RRR, no murmur Resp: CTAB, no wheezes, non-labored Abd: SNTND, BS present, no guarding or organomegaly Ext: No edema, warm Neuro: Alert and oriented, Speech clear, No gross deficits Right Flank: minimal tenderness to right flank   Assessment and plan:  UTI symptoms Given that patient is now having flank pain and that she did have resolution of symptoms with the keflex it seems that patient's treatment course was not extended long enough to cover for uncomplicated pyelo. Had no growth from her last UA and UA this time not accurate as she had taken azo prior to coming in. Will treat patient like an uncomplicated UTI. Given that she did just have keflex will switch to bactrim. While this has lower coverage percentage that keflex, it still has good activity against causal organisms. Will give a 10 day course bactrim DS bid. Strict return precautions given. - bactrim DS 10 day course - diflucan 150mg  x1, + additional dose if still  having symptoms   Orders Placed This Encounter  Procedures  . Urine Culture  . POCT UA - Microscopic Only    Meds ordered this encounter  Medications  . sulfamethoxazole-trimethoprim (BACTRIM DS) 800-160 MG tablet    Sig: Take 1 tablet by mouth 2 (two) times daily for 10 days.    Dispense:  20 tablet    Refill:  0  . fluconazole (DIFLUCAN) 150 MG tablet    Sig: Take 1 dose and if symptoms continue take the other dose 3 days later    Dispense:  2 tablet    Refill:  0     Guadalupe Dawn MD PGY-2 Family Medicine Resident  08/25/2017 11:03 AM

## 2017-08-26 LAB — URINE CULTURE

## 2017-09-20 ENCOUNTER — Other Ambulatory Visit: Payer: Self-pay

## 2017-09-20 MED ORDER — PAROXETINE HCL ER 37.5 MG PO TB24: 37.5000 mg | ORAL_TABLET | Freq: Every day | ORAL | 5 refills | Status: DC

## 2017-10-03 ENCOUNTER — Other Ambulatory Visit: Payer: Self-pay

## 2017-10-03 MED ORDER — PROPRANOLOL HCL 20 MG PO TABS: 20.0000 mg | ORAL_TABLET | Freq: Two times a day (BID) | ORAL | 3 refills | Status: DC

## 2017-10-04 LAB — HM DIABETES EYE EXAM

## 2017-10-14 ENCOUNTER — Encounter: Payer: Self-pay | Admitting: Family Medicine

## 2017-11-16 ENCOUNTER — Ambulatory Visit (HOSPITAL_COMMUNITY)
Admission: EM | Admit: 2017-11-16 | Discharge: 2017-11-16 | Disposition: A | Discharge status: Home / Self Care | Payer: 59 | Attending: Family Medicine | Admitting: Family Medicine

## 2017-11-16 ENCOUNTER — Encounter (HOSPITAL_COMMUNITY): Payer: Self-pay | Admitting: Emergency Medicine

## 2017-11-16 ENCOUNTER — Other Ambulatory Visit: Payer: Self-pay

## 2017-11-16 DIAGNOSIS — R202 Paresthesia of skin: Secondary | ICD-10-CM

## 2017-11-16 MED ORDER — PREDNISONE 10 MG (21) PO TBPK: ORAL_TABLET | Freq: Every day | ORAL | 0 refills | Status: DC

## 2017-11-16 NOTE — ED Triage Notes (Signed)
Patient has had headache and bilateral hand numbness since last night. Patient is on cell phone currently.  laughing  Patient has pain in left side of head.  Patient says this is not her migraine headache and not the worse headache ever.  Patient has bilateral hand numbness, able to move hands and perform fine motor activities.  Patient has equal and strong grips.  Symptoms started last night.

## 2017-11-16 NOTE — ED Provider Notes (Signed)
[facility]   [id number] 11/16/17 Arrival Time: 5852  ASSESSMENT & PLAN:  1. Paresthesia    Meds ordered this encounter  Medications  . predniSONE (STERAPRED UNI-PAK 21 TAB) 10 MG (21) TBPK tablet    Sig: Take by mouth daily. Take as directed.    Dispense:  21 tablet    Refill:  0   Unclear of exact etiology. Entering day #2. After discussion, she elects short trial of prednisone with close PCP follow up.  Follow-up Information    Schedule an appointment as soon as possible for a visit  with Winfrey, Alcario Drought, MD.   Specialty:  Family Medicine Contact information: [address] [place] [address] [place].   Specialty:  Emergency Medicine Why:  If symptoms worsen. Contact information: [address] [id number] [place] [phone]         Reviewed expectations re: course of current medical issues. Questions answered. Outlined signs and symptoms indicating need for more acute intervention. Patient verbalized understanding. After Visit Summary given.   SUBJECTIVE:  Anne Daniels is a 42 y.o. female who presents with complaint of fairly quick onset of what she describes as decreased sensation of her L and R arms.L>R. Started yesterday. Same today. No h/o similar. No trauma/injuries reported. No new medications. Reports her blood sugars are fairly well controlled. No extremity strength changes reported. No recent illnesses. Afebrile. No associated pain. Also reports a mild and generalized headache since yesterday. A dull ache. No worsening today. Able to sleep through the night. No visual changes or n/v. Ambulatory without difficulty. No OTC/home treatment for current symptoms. No specific aggravating or alleviating factors reported regarding current symptoms. Not the worst headache of her life.  ROS: As per HPI. All other systems  negative.   OBJECTIVE:  Vitals:   11/16/17 0952  BP: 126/86  Pulse: (!) 101  Resp: 18  Temp: 98.2 F (36.8 C)  TempSrc: Oral  SpO2: 100%    General appearance: alert; no distress Eyes: PERRLA; EOMI; conjunctiva normal HENT: normocephalic; atraumatic Neck: supple with FROM; no midline tenderness Lungs: clear to auscultation bilaterally Heart: regular rate and rhythm Extremities: no edema; symmetrical with no gross deformities Skin: warm and dry Neurologic: CN 2-12 grossly intact; normal gait; normal symmetric reflexes; reports decreased sensation to dull and sharp of bilateral upper extremities (L>R). Normal strength and ROM. Psychological: alert and cooperative; normal mood and affect   Allergies  Allergen Reactions  . Penicillins Hives    Past Medical History:  Diagnosis Date  . Diabetes mellitus without complication ([place])   . Migraines   . Syncope and collapse    Social History   Socioeconomic History  . Marital status: Married    Spouse name: Not on file  . Number of children: Not on file  . Years of education: Not on file  . Highest education level: Not on file  Occupational History  . Not on file  Social Needs  . Financial resource strain: Not on file  . Food insecurity:    Worry: Not on file    Inability: Not on file  . Transportation needs:    Medical: Not on file    Non-medical: Not on file  Tobacco Use  . Smoking status: Never Smoker  . Smokeless tobacco: Never Used  Substance and Sexual Activity  . Alcohol use: No  . Drug use: No  .  Sexual activity: Yes    Birth control/protection: Pill  Lifestyle  . Physical activity:    Days per week: Not on file    Minutes per session: Not on file  . Stress: Not on file  Relationships  . Social connections:    Talks on phone: Not on file    Gets together: Not on file    Attends religious service: Not on file    Active member of club or organization: Not on file    Attends meetings of clubs or  organizations: Not on file    Relationship status: Not on file  . Intimate partner violence:    Fear of current or ex partner: Not on file    Emotionally abused: Not on file    Physically abused: Not on file    Forced sexual activity: Not on file  Other Topics Concern  . Not on file  Social History Narrative  . Not on file   Family History  Problem Relation Age of Onset  . Diabetes Mother   . Hyperlipidemia Mother   . Hypertension Mother   . Hypertension Father   . Hyperlipidemia Brother   . Hypertension Brother   . Sudden death Neg Hx   . Heart attack Neg Hx    Past Surgical History:  Procedure Laterality Date  . KNEE SURGERY    . LEFT HEART CATHETERIZATION WITH CORONARY ANGIOGRAM N/A 04/05/2014   Procedure: LEFT HEART CATHETERIZATION WITH CORONARY ANGIOGRAM;  Surgeon: Blane Ohara, MD;  Location: [facility] CATH LAB;  Service: Cardiovascular;  Laterality: N/A;  . WISDOM TOOTH EXTRACTION       Vanessa Kick, MD 11/16/17 1201

## 2017-11-23 ENCOUNTER — Encounter: Payer: Self-pay | Admitting: Family Medicine

## 2017-11-23 ENCOUNTER — Ambulatory Visit (INDEPENDENT_AMBULATORY_CARE_PROVIDER_SITE_OTHER): Payer: 59 | Admitting: Family Medicine

## 2017-11-23 VITALS — BP 112/88 | Ht 67.0 in | Wt 280.0 lb

## 2017-11-23 DIAGNOSIS — R200 Anesthesia of skin: Secondary | ICD-10-CM | POA: Diagnosis not present

## 2017-11-23 MED ORDER — METHOCARBAMOL 500 MG PO TABS: 500.0000 mg | ORAL_TABLET | Freq: Three times a day (TID) | ORAL | 1 refills | Status: DC | PRN

## 2017-11-23 MED ORDER — DICLOFENAC SODIUM 75 MG PO TBEC: 75.0000 mg | DELAYED_RELEASE_TABLET | Freq: Two times a day (BID) | ORAL | 1 refills | Status: DC

## 2017-11-23 MED ORDER — GABAPENTIN 300 MG PO CAPS: 300.0000 mg | ORAL_CAPSULE | Freq: Three times a day (TID) | ORAL | 1 refills | Status: DC

## 2017-11-23 NOTE — Patient Instructions (Signed)
This is consistent with a peripheral neuropathy though the anxiety is not helping certainly. Take diclofenac 75mg  twice a day with food - hopefully for just 7-10 days then as needed. Gabapentin 300mg  three times a day - if you do well you can go back up to taking 600mg  three times a day. Robaxin as needed for pain/spasms especially at nighttime. Consider massage, yoga as we discussed. Heat as needed for the shoulders 15 minutes at a time 3-4 times a day. Follow up with me in 4 weeks for reevaluation.

## 2017-11-23 NOTE — Progress Notes (Signed)
PCP: Kathrene Alu, MD  Subjective:   HPI: Patient is a 42 y.o. female here for bilateral arm numbness.  Patient reports she's been under a lot of stress lately. Closing on house she is currently leasing and things came up, has been postponed again to January. She states she woke up last Wednesday morning with numbness and no feeling in both hands. Distribution of entire right hand, left hand up to the shoulder. Right bicep feels a little sore, some pain in left shoulder area. No neck pain though states holds tension in neck area bilaterally. Went to Urgent Care and given prednisone dose pack, has been taking aleve which don't help. No family history of rheumatoid arthritis, other inflammatory arthropathies. Pain up to 9/10 in left shoulder area at its worst and sharp. No weakness, has not noted changes in speech, vision, or other complaints other than above.  Past Medical History:  Diagnosis Date  . Diabetes mellitus without complication ([place])   . Migraines   . Syncope and collapse     Current Outpatient Medications on File Prior to Visit  Medication Sig Dispense Refill  . Blood Glucose Monitoring Suppl (ONETOUCH VERIO) w/Device KIT 1 Device by Does not apply route once a week. 1 kit 0  . fluconazole (DIFLUCAN) 150 MG tablet Take 1 dose and if symptoms continue take the other dose 3 days later 2 tablet 0  . glucose blood (ONETOUCH VERIO) test strip Check blood sugar 6 x daily 25 each 3  . Lancet Devices (ONE TOUCH DELICA LANCING DEV) MISC 1 each by Does not apply route once a week. 1 each 1  . losartan (COZAAR) 25 MG tablet Take 1 tablet (25 mg total) by mouth daily. 90 tablet 3  . metFORMIN (GLUCOPHAGE) 1000 MG tablet take 1 tablet by mouth twice a day WITH A MEAL 180 tablet 3  . NON FORMULARY Cranberry pill    . omeprazole (PRILOSEC) 40 MG capsule take 1 capsule by mouth once daily 30 capsule 5  . ONETOUCH DELICA LANCETS 75F MISC USE AS DIRECTED 100 each 0  . PARoxetine  (PAXIL-CR) 37.5 MG 24 hr tablet Take 1 tablet (37.5 mg total) by mouth daily. 30 tablet 5  . albuterol (PROVENTIL HFA;VENTOLIN HFA) 108 (90 Base) MCG/ACT inhaler Inhale 1-2 puffs into the lungs every 6 (six) hours as needed for wheezing or shortness of breath. (Patient not taking: Reported on 11/23/2017) 1 Inhaler 0  . aspirin EC 81 MG tablet Take 1 tablet (81 mg total) by mouth daily. (Patient not taking: Reported on 11/23/2017) 30 tablet 3  . norethindrone-ethinyl estradiol 1/35 (ORTHO-NOVUM, NORTREL,CYCLAFEM) tablet Take 1 tablet by mouth daily. (Patient not taking: Reported on 11/23/2017) 1 Package 11  . predniSONE (STERAPRED UNI-PAK 21 TAB) 10 MG (21) TBPK tablet Take by mouth daily. Take as directed. (Patient not taking: Reported on 11/23/2017) 21 tablet 0  . propranolol (INDERAL) 20 MG tablet Take 1 tablet (20 mg total) by mouth 2 (two) times daily. (Patient not taking: Reported on 11/23/2017) 60 tablet 3  . traZODone (DESYREL) 50 MG tablet Take 0.5-1 tablets (25-50 mg total) by mouth at bedtime as needed for sleep. (Patient not taking: Reported on 11/23/2017) 30 tablet 3   No current facility-administered medications on file prior to visit.     Past Surgical History:  Procedure Laterality Date  . KNEE SURGERY    . LEFT HEART CATHETERIZATION WITH CORONARY ANGIOGRAM N/A 04/05/2014   Procedure: LEFT HEART CATHETERIZATION WITH CORONARY ANGIOGRAM;  Surgeon: Blane Ohara, MD;  Location: [facility] CATH LAB;  Service: Cardiovascular;  Laterality: N/A;  . WISDOM TOOTH EXTRACTION      Allergies  Allergen Reactions  . Penicillins Hives    Social History   Socioeconomic History  . Marital status: Married    Spouse name: Not on file  . Number of children: Not on file  . Years of education: Not on file  . Highest education level: Not on file  Occupational History  . Not on file  Social Needs  . Financial resource strain: Not on file  . Food insecurity:    Worry: Not on file    Inability: Not on  file  . Transportation needs:    Medical: Not on file    Non-medical: Not on file  Tobacco Use  . Smoking status: Never Smoker  . Smokeless tobacco: Never Used  Substance and Sexual Activity  . Alcohol use: No  . Drug use: No  . Sexual activity: Yes    Birth control/protection: Pill  Lifestyle  . Physical activity:    Days per week: Not on file    Minutes per session: Not on file  . Stress: Not on file  Relationships  . Social connections:    Talks on phone: Not on file    Gets together: Not on file    Attends religious service: Not on file    Active member of club or organization: Not on file    Attends meetings of clubs or organizations: Not on file    Relationship status: Not on file  . Intimate partner violence:    Fear of current or ex partner: Not on file    Emotionally abused: Not on file    Physically abused: Not on file    Forced sexual activity: Not on file  Other Topics Concern  . Not on file  Social History Narrative  . Not on file    Family History  Problem Relation Age of Onset  . Diabetes Mother   . Hyperlipidemia Mother   . Hypertension Mother   . Hypertension Father   . Hyperlipidemia Brother   . Hypertension Brother   . Sudden death Neg Hx   . Heart attack Neg Hx     BP 112/88   Ht 5' 7"  (1.702 m)   Wt 280 lb (127 kg)   LMP 11/15/2017   BMI 43.85 kg/m   Review of Systems: See HPI above.     Objective:  Physical Exam:  Gen: NAD, comfortable in exam room  Neck: No gross deformity, swelling, bruising. TTP bilateral trapezius, rhomboids.  No cervical paraspinal muscle tenderness.  No midline/bony TTP. FROM without pain. BUE strength 5/5.   States unable to feel light touch entire left hand, feels 'pressure' in forearm to light touch.  Diminished sensation entire right hand but present.  No decreased sensation in right forearm. 2+ equal reflexes in triceps, biceps, brachioradialis tendons. Negative spurlings.  Left shoulder: No  swelling, ecchymoses.  No gross deformity. Mild TTP bilateral trapezius muscles. FROM. Negative Hawkins, Neers. Negative Yergasons. Strength 5/5 with empty can and resisted internal/external rotation. Negative apprehension. NV intact distally.  Right shoulder: No swelling, ecchymoses.  No gross deformity. No TTP. FROM. Strength 5/5 with empty can and resisted internal/external rotation. NV intact distally.    Assessment & Plan:  1. Bilateral arm numbness - distribution is not dermatomal and does not fit with specific peripheral nerve distribution.  Cannot be accounted for by Upper  lesion either.  She has been under a lot of stress lately which is likely at least contributing to this.  She's currently on paxil.  We discussed it's possible she slept on arms in a way that has caused an unusual transient neuropathy.  Prednisone has not helped.  She will try diclofenac, restart gabapentin and titrate up.  Robaxin as needed.  We discussed psychotherapy - declined for now.  Also discussed massage, yoga.  F/u in 4 weeks.

## 2017-11-30 ENCOUNTER — Ambulatory Visit: Payer: 59 | Admitting: Family Medicine

## 2017-12-21 ENCOUNTER — Ambulatory Visit (INDEPENDENT_AMBULATORY_CARE_PROVIDER_SITE_OTHER): Payer: 59 | Admitting: Family Medicine

## 2017-12-21 ENCOUNTER — Encounter: Payer: Self-pay | Admitting: Family Medicine

## 2017-12-21 VITALS — BP 149/52

## 2017-12-21 DIAGNOSIS — R200 Anesthesia of skin: Secondary | ICD-10-CM

## 2017-12-21 NOTE — Patient Instructions (Signed)
Start physical therapy and do home exercises on days you don't go to therapy. Increase the gabapentin to 1 tablet in the morning, 2 tablets in afternoon, 2 in evening. Continue the robaxin as needed for spasms. Follow up with me in 5-6 weeks for reevaluation.

## 2017-12-22 ENCOUNTER — Encounter: Payer: Self-pay | Admitting: Family Medicine

## 2017-12-22 NOTE — Progress Notes (Signed)
PCP: Kathrene Alu, MD  Subjective:   HPI: Patient is a 42 y.o. female here for bilateral arm numbness.  10/9: Patient reports she's been under a lot of stress lately. Closing on house she is currently leasing and things came up, has been postponed again to January. She states she woke up last Wednesday morning with numbness and no feeling in both hands. Distribution of entire right hand, left hand up to the shoulder. Right bicep feels a little sore, some pain in left shoulder area. No neck pain though states holds tension in neck area bilaterally. Went to Urgent Care and given prednisone dose pack, has been taking aleve which don't help. No family history of rheumatoid arthritis, other inflammatory arthropathies. Pain up to 9/10 in left shoulder area at its worst and sharp. No weakness, has not noted changes in speech, vision, or other complaints other than above.  11/6: Patient reports she has improved compared to last visit. Symptoms into right arm are better. Still having numbness in left bicep area and left hand to thumb-3rd digit. Swelling has improved also. Has been taking gabapentin 357m tid and has helped some. Diclofenac did not seem to help so stopped this medication. Taking robaxin at night which helps her sleep. No bowel/bladder dysfunction. Pain level at 6/10 level into left arm, sharp. Massage helped temporarily.  Past Medical History:  Diagnosis Date  . Diabetes mellitus without complication ([place]   . Migraines   . Syncope and collapse     Current Outpatient Medications on File Prior to Visit  Medication Sig Dispense Refill  . albuterol (PROVENTIL HFA;VENTOLIN HFA) 108 (90 Base) MCG/ACT inhaler Inhale 1-2 puffs into the lungs every 6 (six) hours as needed for wheezing or shortness of breath. (Patient not taking: Reported on 11/23/2017) 1 Inhaler 0  . aspirin EC 81 MG tablet Take 1 tablet (81 mg total) by mouth daily. (Patient not taking: Reported on  11/23/2017) 30 tablet 3  . Blood Glucose Monitoring Suppl (ONETOUCH VERIO) w/Device KIT 1 Device by Does not apply route once a week. 1 kit 0  . diclofenac (VOLTAREN) 75 MG EC tablet Take 1 tablet (75 mg total) by mouth 2 (two) times daily. 60 tablet 1  . fluconazole (DIFLUCAN) 150 MG tablet Take 1 dose and if symptoms continue take the other dose 3 days later 2 tablet 0  . gabapentin (NEURONTIN) 300 MG capsule Take 1 capsule (300 mg total) by mouth 3 (three) times daily. 90 capsule 1  . glucose blood (ONETOUCH VERIO) test strip Check blood sugar 6 x daily 25 each 3  . Lancet Devices (ONE TOUCH DELICA LANCING DEV) MISC 1 each by Does not apply route once a week. 1 each 1  . losartan (COZAAR) 25 MG tablet Take 1 tablet (25 mg total) by mouth daily. 90 tablet 3  . metFORMIN (GLUCOPHAGE) 1000 MG tablet take 1 tablet by mouth twice a day WITH A MEAL 180 tablet 3  . methocarbamol (ROBAXIN) 500 MG tablet Take 1 tablet (500 mg total) by mouth every 8 (eight) hours as needed. 60 tablet 1  . NON FORMULARY Cranberry pill    . norethindrone-ethinyl estradiol 1/35 (ORTHO-NOVUM, NORTREL,CYCLAFEM) tablet Take 1 tablet by mouth daily. (Patient not taking: Reported on 11/23/2017) 1 Package 11  . omeprazole (PRILOSEC) 40 MG capsule take 1 capsule by mouth once daily 30 capsule 5  . ONETOUCH DELICA LANCETS 370YMISC USE AS DIRECTED 100 each 0  . PARoxetine (PAXIL-CR) 37.5 MG 24 hr  tablet Take 1 tablet (37.5 mg total) by mouth daily. 30 tablet 5  . predniSONE (STERAPRED UNI-PAK 21 TAB) 10 MG (21) TBPK tablet Take by mouth daily. Take as directed. (Patient not taking: Reported on 11/23/2017) 21 tablet 0  . propranolol (INDERAL) 20 MG tablet Take 1 tablet (20 mg total) by mouth 2 (two) times daily. (Patient not taking: Reported on 11/23/2017) 60 tablet 3  . traZODone (DESYREL) 50 MG tablet Take 0.5-1 tablets (25-50 mg total) by mouth at bedtime as needed for sleep. (Patient not taking: Reported on 11/23/2017) 30 tablet 3    No current facility-administered medications on file prior to visit.     Past Surgical History:  Procedure Laterality Date  . KNEE SURGERY    . LEFT HEART CATHETERIZATION WITH CORONARY ANGIOGRAM N/A 04/05/2014   Procedure: LEFT HEART CATHETERIZATION WITH CORONARY ANGIOGRAM;  Surgeon: Blane Ohara, MD;  Location: [facility] CATH LAB;  Service: Cardiovascular;  Laterality: N/A;  . WISDOM TOOTH EXTRACTION      Allergies  Allergen Reactions  . Penicillins Hives    Social History   Socioeconomic History  . Marital status: Married    Spouse name: Not on file  . Number of children: Not on file  . Years of education: Not on file  . Highest education level: Not on file  Occupational History  . Not on file  Social Needs  . Financial resource strain: Not on file  . Food insecurity:    Worry: Not on file    Inability: Not on file  . Transportation needs:    Medical: Not on file    Non-medical: Not on file  Tobacco Use  . Smoking status: Never Smoker  . Smokeless tobacco: Never Used  Substance and Sexual Activity  . Alcohol use: No  . Drug use: No  . Sexual activity: Yes    Birth control/protection: Pill  Lifestyle  . Physical activity:    Days per week: Not on file    Minutes per session: Not on file  . Stress: Not on file  Relationships  . Social connections:    Talks on phone: Not on file    Gets together: Not on file    Attends religious service: Not on file    Active member of club or organization: Not on file    Attends meetings of clubs or organizations: Not on file    Relationship status: Not on file  . Intimate partner violence:    Fear of current or ex partner: Not on file    Emotionally abused: Not on file    Physically abused: Not on file    Forced sexual activity: Not on file  Other Topics Concern  . Not on file  Social History Narrative  . Not on file    Family History  Problem Relation Age of Onset  . Diabetes Mother   . Hyperlipidemia Mother   .  Hypertension Mother   . Hypertension Father   . Hyperlipidemia Brother   . Hypertension Brother   . Sudden death Neg Hx   . Heart attack Neg Hx     BP (!) 149/52   Review of Systems: See HPI above.     Objective:  Physical Exam:  Gen: NAD, comfortable in exam room  Neck: No gross deformity, swelling, bruising. TTP left > right trapezius, rhomboids.  No other tenderness.  No midline/bony TTP. FROM with pain on left lateral rotation. BUE strength 5/5.   Sensation diminished left 1st-3rd  digits. 1+ MSRs left brachioradialis and biceps, 2+ triceps.  2+ right brachioradialis, biceps, triceps. Negative spurlings. 2+ radial pulses.  Left shoulder: No swelling, ecchymoses.  No gross deformity. TTP trapezius muscles noted above. FROM. Mild pain Hawkins, negative Neers. Negative Yergasons. Strength 5/5 with empty can and resisted internal/external rotation. NV intact distally.   Assessment & Plan:  1. Bilateral arm numbness - Right side improved, now localized into the left arm in C6-7 distribution with decreased reflex.  Symptomatically improved though and no weakness.  Discussed options - she will start physical therapy.  Increase gabapentin to 33m qam, 600 in afternoon and evening.  Continue robaxin as needed.  F/u in 5-6 weeks.  S/p prednisone dose pack, diclofenac without benefit.

## 2017-12-27 ENCOUNTER — Other Ambulatory Visit: Payer: Self-pay

## 2017-12-27 ENCOUNTER — Ambulatory Visit: Payer: 59 | Attending: Internal Medicine | Admitting: Occupational Therapy

## 2017-12-27 ENCOUNTER — Encounter: Payer: Self-pay | Admitting: Occupational Therapy

## 2017-12-27 DIAGNOSIS — M25612 Stiffness of left shoulder, not elsewhere classified: Secondary | ICD-10-CM | POA: Diagnosis present

## 2017-12-27 DIAGNOSIS — R208 Other disturbances of skin sensation: Secondary | ICD-10-CM

## 2017-12-27 DIAGNOSIS — M79642 Pain in left hand: Secondary | ICD-10-CM | POA: Diagnosis present

## 2017-12-27 DIAGNOSIS — M6281 Muscle weakness (generalized): Secondary | ICD-10-CM

## 2017-12-27 DIAGNOSIS — R278 Other lack of coordination: Secondary | ICD-10-CM | POA: Diagnosis present

## 2017-12-27 NOTE — Therapy (Signed)
[facility] [address] [place], [place], [address] Phone: [phone]   Fax:  [phone]  Occupational Therapy Evaluation  Patient Details  Name: Anne Daniels MRN: [id number] Date of Birth: 12-Jan-1976 Referring Provider (OT): Dr Barbaraann Barthel   Encounter Date: 12/27/2017  OT End of Session - 12/27/17 1640    Visit Number  1    Number of Visits  17       Past Medical History:  Diagnosis Date  . Diabetes mellitus without complication ([place])   . Migraines   . Syncope and collapse     Past Surgical History:  Procedure Laterality Date  . KNEE SURGERY    . LEFT HEART CATHETERIZATION WITH CORONARY ANGIOGRAM N/A 04/05/2014   Procedure: LEFT HEART CATHETERIZATION WITH CORONARY ANGIOGRAM;  Surgeon: Blane Ohara, MD;  Location: [facility] CATH LAB;  Service: Cardiovascular;  Laterality: N/A;  . WISDOM TOOTH EXTRACTION      There were no vitals filed for this visit.  Subjective Assessment - 12/27/17 1538    Subjective   Patient feels that right side is almost completely resolved.       Pertinent History  syncope    Currently in Pain?  Yes    Pain Score  6     Pain Location  Hand    Pain Orientation  Left    Pain Descriptors / Indicators  Burning;Aching   burning in knuckles   Pain Type  Acute pain    Pain Onset  1 to 4 weeks ago    Pain Frequency  Constant    Aggravating Factors   resting elbw forearm on a surface    Pain Relieving Factors  patient unaware        [facility] OT Assessment - 12/27/17 0001      Assessment   Medical Diagnosis  hand numbness    Referring Provider (OT)  Dr Barbaraann Barthel    Onset Date/Surgical Date  12/24/17    Hand Dominance  Right    Next MD Visit  02/01/2018    Prior Therapy  Not for this issue      Precautions   Precautions  None      Restrictions   Weight Bearing Restrictions  No      Balance Screen   Has the patient fallen in the past 6 months  No      Prior Function   Level  of Independence  Independent;Independent with basic ADLs    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    Leisure  cross stitch, arts and crafts, reading- actual book      ADL   Eating/Feeding  Modified independent   modified technique for cutting   Grooming  Modified independent   cleaning ears   Upper Body Bathing  Independent    Lower Body Bathing  Independent    Upper Body Dressing  Use of adaptive device    Lower Body Dressing  Needs assist for fasteners;Increased time    Banker - Astronomer -  Control and instrumentation engineer  Independent      IADL   Prior Level of Function Light Housekeeping  independent    Light Housekeeping  Needs help with all home maintenance tasks;Performs light daily tasks but cannot maintain acceptable level of cleanliness    Prior Level of Function  Meal Prep  independent    Meal Prep  --   has difficulty chopping vegetables     Written Expression   Dominant Hand  Right      Cognition   Overall Cognitive Status  Within Functional Limits for tasks assessed      Observation/Other Assessments   Focus on Therapeutic Outcomes (FOTO)   NA      Sensation   Light Touch  Impaired by gross assessment    Stereognosis  Impaired by gross assessment    Hot/Cold  Impaired by gross assessment    Proprioception  Impaired by gross assessment      Coordination   Gross Motor Movements are Fluid and Coordinated  No    Fine Motor Movements are Fluid and Coordinated  No    9 Hole Peg Test  Right;Left    Right 9 Hole Peg Test  22.31    Left 9 Hole Peg Test  35.13      ROM / Strength   AROM / PROM / Strength  AROM      AROM   Overall AROM   Deficits    Overall AROM Comments  Pain with flexion beyond 145, and abduction beyod 97 *      Hand Function   Right Hand Gross Grasp  Functional    Right Hand Grip (lbs)  79    Right  Hand Lateral Pinch  12 lbs    Right Hand 3 Point Pinch  9 lbs    Left Hand Gross Grasp  Impaired    Left Hand Grip (lbs)  59, 55, 40    Left Hand Lateral Pinch  9 lbs    Left 3 point pinch  8 lbs                      OT Education - 12/27/17 1640    Education Details  reviewed evaluation results, and potential goals and plan of care    Person(s) Educated  Patient    Methods  Explanation    Comprehension  Verbalized understanding       OT Short Term Goals - 12/27/17 2239      OT SHORT TERM GOAL #1   Title  Patient will complete a home exercise program to address coordination in LUE DUE 01/26/18    Time  4    Period  Weeks    Status  New    Target Date  01/26/18      OT SHORT TERM GOAL #2   Title  Patient will complete a home exercise program designed to improve AROM in left shoulder, wrist and digits    Time  4    Period  Weeks    Status  New      OT SHORT TERM GOAL #3   Title  Patient will utilize LUE to pick up lightweight objects (less than 2 lbs) from low reach position with min cueing.      Time  4    Period  Weeks    Status  New      OT SHORT TERM GOAL #4   Title  Patient will demonstrate awareness of positions which exagerate her symptoms of numbness    Time  4    Period  Weeks    Status  New        OT Long Term Goals - 12/27/17 2245      OT LONG TERM GOAL #1   Title  Patient will complete an HEP designed to improve grip and pinch strength due 02/25/18    Time  8    Period  Weeks    Status  New    Target Date  02/25/18      OT LONG TERM GOAL #2   Title  Patient will demonstrate a 5 second reduction in time to complete LUE 9 hole peg test to aide with coordiantion for ADL     Time  8    Period  Weeks    Status  New      OT LONG TERM GOAL #3   Title  Patient will button her pants using BUE technique    Time  8    Period  Weeks    Status  New      OT LONG TERM GOAL #4   Title  Patient will chop a fruit or vegetable using BUE  technique    Time  8    Period  Weeks    Status  New      OT LONG TERM GOAL #5   Title  Patient will return to needlepoint activity using BUE technique    Time  8    Period  Weeks    Status  New            Plan - 12/27/17 2214    Clinical Impression Statement  Patient is a 42 year old woman admitted to OP OT to address UE Numbness LUE>RUE.  Patient with ED visit in early October with sudden onset bilateral numbness, now with predominantly LUE numbness in thumb, index and long fingers.  Patient presents to OT eval today with decreased strength, decreased coordiantion, decreased funtional use, and increased discomfort due to numbness in LUE.  Patient also with pain with reach overhead with L Shoulder flexion.   Patient will benfit from skilled OT intervention to address her ability to return to time efficient and safe ADL/ADL, and functional use of her nondominant Left UE.      Occupational Profile and client history currently impacting functional performance  Wife, employee, student    Occupational performance deficits (Please refer to evaluation for details):  ADL's;IADL's;Rest and Sleep    Rehab Potential  Good    OT Frequency  2x / week    OT Duration  8 weeks    OT Treatment/Interventions  Self-care/ADL training;Electrical Stimulation;Iontophoresis;Therapeutic exercise;Aquatic Therapy;Moist Heat;Paraffin;Neuromuscular education;Splinting;Fluidtherapy;Energy conservation;Therapist, nutritional;Therapeutic activities;Cryotherapy;Ultrasound;Contrast Bath;DME and/or AE instruction;Manual Therapy;Passive range of motion    Clinical Decision Making  Limited treatment options, no task modification necessary    Consulted and Agree with Plan of Care  Patient       Patient will benefit from skilled therapeutic intervention in order to improve the following deficits and impairments:  Decreased range of motion, Decreased strength, Increased muscle spasms, Impaired tone, Impaired UE  functional use, Impaired perceived functional ability, Decreased mobility, Impaired sensation, Improper body mechanics, Decreased knowledge of use of DME, Decreased coordination, Decreased activity tolerance, Increased edema, Impaired flexibility  Visit Diagnosis: Other disturbances of skin sensation - Plan: Ot plan of care cert/re-cert  Muscle weakness (generalized) - Plan: Ot plan of care cert/re-cert  Stiffness of left shoulder, not elsewhere classified - Plan: Ot plan of care cert/re-cert  Pain in left hand - Plan: Ot plan of care cert/re-cert  Other lack of coordination - Plan: Ot plan of care cert/re-cert    Problem List Patient Active Problem List   Diagnosis Date Noted  . UTI  symptoms 08/25/2017  . Dysmenorrhea 02/25/2017  . Non-intractable vomiting with nausea 09/24/2016  . Screening for cervical cancer 02/27/2016  . Neuropathy, diabetic ([place]) 05/21/2014  . Abnormal nuclear stress test   . Abnormal uterine bleeding 12/14/2011  . Obstructive sleep apnea 04/16/2011  . HOT FLASHES 10/17/2009  . ROTATOR CUFF INJURY, LEFT SHOULDER 02/27/2009  . CERVICAL RADICULOPATHY, LEFT 02/21/2009  . Depression, unipolar ([place]) 09/03/2008  . TRANSAMINASES, SERUM, ELEVATED 06/26/2008  . POSTURAL HYPOTENSION 04/02/2008  . Diabetes mellitus without complication ([place]) 76/54/6503  . DISORDER, DYSMETABOLIC SYNDROME X 54/65/6812  . SPRAIN/STRAIN, LUMBAR REGION 09/15/2006  . HYPERTRIGLYCERIDEMIA 07/05/2006  . KNEE PAIN, LEFT, CHRONIC 06/02/2006  . OBESITY, NOS 04/14/2006    Mariah Milling, OTR/L 12/27/2017, 11:00 PM  [facility] [address] [place], [place], [address] Phone: [phone]   Fax:  [phone]  Name: Anne Daniels MRN: [id number] Date of Birth: 1975-02-20

## 2017-12-30 ENCOUNTER — Ambulatory Visit: Payer: 59 | Admitting: Occupational Therapy

## 2018-01-03 ENCOUNTER — Ambulatory Visit: Payer: 59 | Admitting: Occupational Therapy

## 2018-01-03 DIAGNOSIS — M6281 Muscle weakness (generalized): Secondary | ICD-10-CM

## 2018-01-03 DIAGNOSIS — M25612 Stiffness of left shoulder, not elsewhere classified: Secondary | ICD-10-CM

## 2018-01-03 DIAGNOSIS — M79642 Pain in left hand: Secondary | ICD-10-CM

## 2018-01-03 DIAGNOSIS — R278 Other lack of coordination: Secondary | ICD-10-CM

## 2018-01-03 DIAGNOSIS — R208 Other disturbances of skin sensation: Secondary | ICD-10-CM

## 2018-01-03 NOTE — Therapy (Signed)
[facility] [address] [place], [place], [address] Phone: [phone]   Fax:  [phone]  Occupational Therapy Treatment  Patient Details  Name: Anne Daniels MRN: [id number] Date of Birth: [birth year] Referring Provider (OT): Dr Barbaraann Barthel   Encounter Date: 01/03/2018  OT End of Session - 01/03/18 1535    Visit Number  2    Number of Visits  17    OT Start Time  6761    OT Stop Time  1615    OT Time Calculation (min)  41 min    Activity Tolerance  Patient tolerated treatment well    Behavior During Therapy  [facility] for tasks assessed/performed       Past Medical History:  Diagnosis Date  . Diabetes mellitus without complication ([place])   . Migraines   . Syncope and collapse     Past Surgical History:  Procedure Laterality Date  . KNEE SURGERY    . LEFT HEART CATHETERIZATION WITH CORONARY ANGIOGRAM N/A 04/05/2014   Procedure: LEFT HEART CATHETERIZATION WITH CORONARY ANGIOGRAM;  Surgeon: Blane Ohara, MD;  Location: [facility] CATH LAB;  Service: Cardiovascular;  Laterality: N/A;  . WISDOM TOOTH EXTRACTION      There were no vitals filed for this visit.  Subjective Assessment - 01/03/18 1534    Currently in Pain?  Yes    Pain Score  6     Pain Location  Arm    Pain Orientation  Left    Pain Descriptors / Indicators  Aching;Burning    Pain Type  Acute pain    Pain Onset  More than a month ago    Pain Frequency  Intermittent    Aggravating Factors   resting elbow on a surface    Pain Relieving Factors  unknown                 Treatment: Supine on foam roller performing scapular retraction, protraction with arms overhead, then with foam roller horizontal across back for gentle stretch, min v.c .Doorway stretch x 5 reps min v.c Pt was instructed in median n. Gliding and tendon gliding exercises, 10 reps each min v.c followed by education in cane exercises and coordination exercises. Hot pack applied to  left shoulder x 10 mins while performing coordination activities at table. (No adverse reactions)          OT Education - 01/03/18 1647    Education Details  Median nerve gliding, tendon gliding, cane exercises and coordination HEP (see pt instructions for cane and coordination)    Person(s) Educated  Patient    Methods  Explanation;Demonstration;Verbal cues;Handout    Comprehension  Verbalized understanding;Returned demonstration;Verbal cues required       OT Short Term Goals - 12/27/17 2239      OT SHORT TERM GOAL #1   Title  Patient will complete a home exercise program to address coordination in LUE DUE 01/26/18    Time  4    Period  Weeks    Status  New    Target Date  01/26/18      OT SHORT TERM GOAL #2   Title  Patient will complete a home exercise program designed to improve AROM in left shoulder, wrist and digits    Time  4    Period  Weeks    Status  New      OT SHORT TERM GOAL #3   Title  Patient will utilize LUE to pick up lightweight objects (  less than 2 lbs) from low reach position with min cueing.      Time  4    Period  Weeks    Status  New      OT SHORT TERM GOAL #4   Title  Patient will demonstrate awareness of positions which exagerate her symptoms of numbness    Time  4    Period  Weeks    Status  New        OT Long Term Goals - 12/27/17 2245      OT LONG TERM GOAL #1   Title  Patient will complete an HEP designed to improve grip and pinch strength due 02/25/18    Time  8    Period  Weeks    Status  New    Target Date  02/25/18      OT LONG TERM GOAL #2   Title  Patient will demonstrate a 5 second reduction in time to complete LUE 9 hole peg test to aide with coordiantion for ADL     Time  8    Period  Weeks    Status  New      OT LONG TERM GOAL #3   Title  Patient will button her pants using BUE technique    Time  8    Period  Weeks    Status  New      OT LONG TERM GOAL #4   Title  Patient will chop a fruit or vegetable using  BUE technique    Time  8    Period  Weeks    Status  New      OT LONG TERM GOAL #5   Title  Patient will return to needlepoint activity using BUE technique    Time  8    Period  Weeks    Status  New            Plan - 01/03/18 1650    Clinical Impression Statement  Pt is progressing towards goals. She demonstrates understanding of inital HEP.    Occupational Profile and client history currently impacting functional performance  Wife, employee, student    Occupational performance deficits (Please refer to evaluation for details):  ADL's;IADL's;Rest and Sleep    Rehab Potential  Good    OT Frequency  2x / week    OT Duration  8 weeks    OT Treatment/Interventions  Self-care/ADL training;Electrical Stimulation;Iontophoresis;Therapeutic exercise;Aquatic Therapy;Moist Heat;Paraffin;Neuromuscular education;Splinting;Fluidtherapy;Energy conservation;Therapist, nutritional;Therapeutic activities;Cryotherapy;Ultrasound;Contrast Bath;DME and/or AE instruction;Manual Therapy;Passive range of motion    Plan  review HEP, pain reduction and stretching to LUE    Consulted and Agree with Plan of Care  Patient       Patient will benefit from skilled therapeutic intervention in order to improve the following deficits and impairments:  Decreased range of motion, Decreased strength, Increased muscle spasms, Impaired tone, Impaired UE functional use, Impaired perceived functional ability, Decreased mobility, Impaired sensation, Improper body mechanics, Decreased knowledge of use of DME, Decreased coordination, Decreased activity tolerance, Increased edema, Impaired flexibility  Visit Diagnosis: Other disturbances of skin sensation  Muscle weakness (generalized)  Stiffness of left shoulder, not elsewhere classified  Pain in left hand  Other lack of coordination    Problem List Patient Active Problem List   Diagnosis Date Noted  . UTI symptoms 08/25/2017  . Dysmenorrhea 02/25/2017  .  Non-intractable vomiting with nausea 09/24/2016  . Screening for cervical cancer 02/27/2016  . Neuropathy, diabetic ([place]) 05/21/2014  .  Abnormal nuclear stress test   . Abnormal uterine bleeding 12/14/2011  . Obstructive sleep apnea 04/16/2011  . HOT FLASHES 10/17/2009  . ROTATOR CUFF INJURY, LEFT SHOULDER 02/27/2009  . CERVICAL RADICULOPATHY, LEFT 02/21/2009  . Depression, unipolar ([place]) 09/03/2008  . TRANSAMINASES, SERUM, ELEVATED 06/26/2008  . POSTURAL HYPOTENSION 04/02/2008  . Diabetes mellitus without complication ([place]) 12/12/9020  . DISORDER, DYSMETABOLIC SYNDROME X 84/07/9859  . SPRAIN/STRAIN, LUMBAR REGION 09/15/2006  . HYPERTRIGLYCERIDEMIA 07/05/2006  . KNEE PAIN, LEFT, CHRONIC 06/02/2006  . OBESITY, NOS 04/14/2006    RINE,KATHRYN 01/03/2018, 4:57 PM  [facility] [address] [place], [place], [address] Phone: [phone]   Fax:  [phone]  Name: Anne Daniels MRN: [id number] Date of Birth: 24-Aug-1975

## 2018-01-03 NOTE — Patient Instructions (Signed)
   Lie on back holding wand. Raise arms over head. Hold 5sec. Repeat 10 times per set.  Do 1-2 sessions per day. Only bring arms back as far as comfortable, stop in sharp pain.   ROM: Abduction - Wand   Holding wand with left hand palm up, push wand directly out to side, leading with other hand palm down, until stretch is felt. Hold 5 seconds. Repeat 10 times per set. Do 2-3 sessions per day. (Lying down)   ROM: Extension - Wand (Standing)   Stand holding wand behind back. Raise arms as far as possible. Repeat 10 times per set.  1-2 x day       [employer] - Standing   With arms straight, hold cane forward at waist. Raise cane above head. Hold 3 seconds. Repeat 10 times. Do 2 times per day.             Coordination Activities  Perform the following activities for 20 minutes 1 times per day with left hand(s).   Rotate ball in fingertips (clockwise and counter-clockwise).  Deal cards with your thumb (Hold deck in hand and push card off top with thumb).  Pick up coins and stack.  Pick up coins one at a time until you get 5-10 in your hand, then move coins from palm to fingertips to stack one at a time.

## 2018-01-10 ENCOUNTER — Ambulatory Visit: Payer: 59 | Admitting: Occupational Therapy

## 2018-01-10 DIAGNOSIS — M6281 Muscle weakness (generalized): Secondary | ICD-10-CM

## 2018-01-10 DIAGNOSIS — M25612 Stiffness of left shoulder, not elsewhere classified: Secondary | ICD-10-CM

## 2018-01-10 DIAGNOSIS — R278 Other lack of coordination: Secondary | ICD-10-CM

## 2018-01-10 DIAGNOSIS — R208 Other disturbances of skin sensation: Secondary | ICD-10-CM | POA: Diagnosis not present

## 2018-01-10 DIAGNOSIS — M79642 Pain in left hand: Secondary | ICD-10-CM

## 2018-01-10 NOTE — Therapy (Signed)
[facility] [address] [place], [place], [address] Phone: [phone]   Fax:  [phone]  Occupational Therapy Treatment  Patient Details  Name: Anne Daniels MRN: [id number] Date of Birth: 1975/08/06 Referring Provider (OT): Dr Barbaraann Barthel   Encounter Date: 01/10/2018  OT End of Session - 01/10/18 1455    Visit Number  3    Number of Visits  [place] - Visit Number  3    Authorization - Number of Visits  60    OT Start Time  1450    OT Stop Time  1530    OT Time Calculation (min)  40 min    Activity Tolerance  Patient tolerated treatment well    Behavior During Therapy  [facility] for tasks assessed/performed       Past Medical History:  Diagnosis Date  . Diabetes mellitus without complication ([place])   . Migraines   . Syncope and collapse     Past Surgical History:  Procedure Laterality Date  . KNEE SURGERY    . LEFT HEART CATHETERIZATION WITH CORONARY ANGIOGRAM N/A 04/05/2014   Procedure: LEFT HEART CATHETERIZATION WITH CORONARY ANGIOGRAM;  Surgeon: Blane Ohara, MD;  Location: [facility] CATH LAB;  Service: Cardiovascular;  Laterality: N/A;  . WISDOM TOOTH EXTRACTION      There were no vitals filed for this visit.  Subjective Assessment - 01/10/18 1454    Currently in Pain?  Yes    Pain Score  3     Pain Location  Arm    Pain Orientation  Left    Pain Descriptors / Indicators  Aching;Burning    Pain Onset  More than a month ago    Pain Frequency  Intermittent    Aggravating Factors   resting elbow on a surface    Pain Relieving Factors  unknown          Treatment: Fluidotherapy x 10 mins to LUE with hotpack applied to left shoulder for pain relief, no adverse reactions. Reviewed nerve and tendon gliding ex from last visit, 10 reps each, min v,c  Supine closed chain shoulder flexion, chest press and shoulder abduction, pt reports no increase in pain with these  activities, 15 reps each, min v.c Standing wall slides 10 reps, min v.c/ facilitation for positioning. Arm bike x 5 mins level 1 for conditioning, for reciprocal movement/ conditioning, no increased pain.                    OT Short Term Goals - 12/27/17 2239      OT SHORT TERM GOAL #1   Title  Patient will complete a home exercise program to address coordination in LUE DUE 01/26/18    Time  4    Period  Weeks    Status  New    Target Date  01/26/18      OT SHORT TERM GOAL #2   Title  Patient will complete a home exercise program designed to improve AROM in left shoulder, wrist and digits    Time  4    Period  Weeks    Status  New      OT SHORT TERM GOAL #3   Title  Patient will utilize LUE to pick up lightweight objects (less than 2 lbs) from low reach position with min cueing.      Time  4    Period  Weeks  Status  New      OT SHORT TERM GOAL #4   Title  Patient will demonstrate awareness of positions which exagerate her symptoms of numbness    Time  4    Period  Weeks    Status  New        OT Long Term Goals - 12/27/17 2245      OT LONG TERM GOAL #1   Title  Patient will complete an HEP designed to improve grip and pinch strength due 02/25/18    Time  8    Period  Weeks    Status  New    Target Date  02/25/18      OT LONG TERM GOAL #2   Title  Patient will demonstrate a 5 second reduction in time to complete LUE 9 hole peg test to aide with coordiantion for ADL     Time  8    Period  Weeks    Status  New      OT LONG TERM GOAL #3   Title  Patient will button her pants using BUE technique    Time  8    Period  Weeks    Status  New      OT LONG TERM GOAL #4   Title  Patient will chop a fruit or vegetable using BUE technique    Time  8    Period  Weeks    Status  New      OT LONG TERM GOAL #5   Title  Patient will return to needlepoint activity using BUE technique    Time  8    Period  Weeks    Status  New            Plan -  01/10/18 1456    Clinical Impression Statement  Pt is progressing towards goals. She demonstrates improving pain in LUE    Occupational performance deficits (Please refer to evaluation for details):  ADL's;IADL's;Rest and Sleep    Rehab Potential  Good    OT Frequency  2x / week    OT Duration  8 weeks    OT Treatment/Interventions  Self-care/ADL training;Electrical Stimulation;Iontophoresis;Therapeutic exercise;Aquatic Therapy;Moist Heat;Paraffin;Neuromuscular education;Splinting;Fluidtherapy;Energy conservation;Therapist, nutritional;Therapeutic activities;Cryotherapy;Ultrasound;Contrast Bath;DME and/or AE instruction;Manual Therapy;Passive range of motion    Plan  coordination, pain reduction and stretching to LUE    Consulted and Agree with Plan of Care  Patient       Patient will benefit from skilled therapeutic intervention in order to improve the following deficits and impairments:  Decreased range of motion, Decreased strength, Increased muscle spasms, Impaired tone, Impaired UE functional use, Impaired perceived functional ability, Decreased mobility, Impaired sensation, Improper body mechanics, Decreased knowledge of use of DME, Decreased coordination, Decreased activity tolerance, Increased edema, Impaired flexibility  Visit Diagnosis: Other disturbances of skin sensation  Muscle weakness (generalized)  Stiffness of left shoulder, not elsewhere classified  Pain in left hand  Other lack of coordination    Problem List Patient Active Problem List   Diagnosis Date Noted  . UTI symptoms 08/25/2017  . Dysmenorrhea 02/25/2017  . Non-intractable vomiting with nausea 09/24/2016  . Screening for cervical cancer 02/27/2016  . Neuropathy, diabetic ([place]) 05/21/2014  . Abnormal nuclear stress test   . Abnormal uterine bleeding 12/14/2011  . Obstructive sleep apnea 04/16/2011  . HOT FLASHES 10/17/2009  . ROTATOR CUFF INJURY, LEFT SHOULDER 02/27/2009  . CERVICAL  RADICULOPATHY, LEFT 02/21/2009  . Depression, unipolar ([place]) 09/03/2008  . TRANSAMINASES,  SERUM, ELEVATED 06/26/2008  . POSTURAL HYPOTENSION 04/02/2008  . Diabetes mellitus without complication ([place]) 09/64/3838  . DISORDER, DYSMETABOLIC SYNDROME X 18/40/3754  . SPRAIN/STRAIN, LUMBAR REGION 09/15/2006  . HYPERTRIGLYCERIDEMIA 07/05/2006  . KNEE PAIN, LEFT, CHRONIC 06/02/2006  . OBESITY, NOS 04/14/2006    RINE,KATHRYN 01/10/2018, 3:06 PM  [facility] [address] [place], [place], [address] Phone: [phone]   Fax:  [phone]  Name: Anne Daniels MRN: [id number] Date of Birth: [birth year]

## 2018-01-11 ENCOUNTER — Other Ambulatory Visit: Payer: Self-pay | Admitting: *Deleted

## 2018-01-11 ENCOUNTER — Other Ambulatory Visit: Payer: Self-pay | Admitting: Family Medicine

## 2018-01-11 MED ORDER — GABAPENTIN 300 MG PO CAPS: ORAL_CAPSULE | ORAL | 2 refills | Status: DC

## 2018-01-16 ENCOUNTER — Encounter: Payer: Self-pay | Admitting: Occupational Therapy

## 2018-01-16 ENCOUNTER — Ambulatory Visit: Payer: 59 | Attending: Internal Medicine | Admitting: Occupational Therapy

## 2018-01-16 DIAGNOSIS — M6281 Muscle weakness (generalized): Secondary | ICD-10-CM

## 2018-01-16 DIAGNOSIS — M25612 Stiffness of left shoulder, not elsewhere classified: Secondary | ICD-10-CM | POA: Diagnosis present

## 2018-01-16 DIAGNOSIS — M79642 Pain in left hand: Secondary | ICD-10-CM | POA: Insufficient documentation

## 2018-01-16 DIAGNOSIS — R208 Other disturbances of skin sensation: Secondary | ICD-10-CM | POA: Diagnosis present

## 2018-01-16 DIAGNOSIS — R278 Other lack of coordination: Secondary | ICD-10-CM | POA: Diagnosis present

## 2018-01-16 NOTE — Therapy (Signed)
[facility] [address] [place], [place], [address] Phone: [phone]   Fax:  [phone]  Occupational Therapy Treatment  Patient Details  Name: Anne Daniels MRN: [id number] Date of Birth: November 18, 1975 Referring Provider (OT): Dr Barbaraann Barthel   Encounter Date: 01/16/2018  OT End of Session - 01/16/18 1647    Visit Number  4    Number of Visits  [place] - Visit Number  4    Authorization - Number of Visits  28    OT Start Time  2122    OT Stop Time  1618    OT Time Calculation (min)  45 min    Activity Tolerance  Patient tolerated treatment well    Behavior During Therapy  [facility] for tasks assessed/performed       Past Medical History:  Diagnosis Date  . Diabetes mellitus without complication ([place])   . Migraines   . Syncope and collapse     Past Surgical History:  Procedure Laterality Date  . KNEE SURGERY    . LEFT HEART CATHETERIZATION WITH CORONARY ANGIOGRAM N/A 04/05/2014   Procedure: LEFT HEART CATHETERIZATION WITH CORONARY ANGIOGRAM;  Surgeon: Blane Ohara, MD;  Location: [facility] CATH LAB;  Service: Cardiovascular;  Laterality: N/A;  . WISDOM TOOTH EXTRACTION      There were no vitals filed for this visit.  Subjective Assessment - 01/16/18 1541    Subjective   Patient indicates pain is a little worse today - has not been able to coordinate with massage     Pertinent History  syncope    Currently in Pain?  Yes    Pain Score  5     Pain Location  Shoulder    Pain Orientation  Left    Pain Descriptors / Indicators  Aching    Pain Type  Acute pain    Pain Onset  More than a month ago    Pain Frequency  Intermittent    Aggravating Factors   posture at work station                   OT Treatments/Exercises (OP) - 01/16/18 0001      ADLs   Grooming  Patient reports that she is better able to brush her hair, but is still finding it difficullt to clean  ears with a Q-Tip.  She fids it extremely challenging to put in earrings due to decreased sensation in left hand.      Cooking  Needed to use a scissors under pop top to open a can of vegetables for a casserole she was making.  Unable to pry pop top up from lid.      Work  Patient has purchased a sit to [employer], and is finding it helpful to adjust the height of her work station..  Patient works at a compuer all day, and  is using a laptop and two monitors at equal height for the majority of her work.  Discussed chair options, or more mobile seating surface.  Discussed and demonstrated better posture for  desk / computer work.  Patient indicates that her work is 50% mouse, and 50% typing.        Neurological Re-education Exercises   Other Exercises 1  Worked on interlimb and fine motor coordination exercises.  Patient needed intermittent cueing to adjust height of shoulder for task. overtly discussed roile of elbow, forearm, and wrist  to align hand for task.      Other Exercises 2  Worked on graded control of muscle tension in left hand.  Patient witrh diminished sensation in left hand, and was encouraged to use vision to compensate, e.g. carrying a styrfoam cup - cup changes shape if squeezing too hard .        Modalities   Modalities  Fluidotherapy;Moist Heat      Moist Heat Therapy   Number Minutes Moist Heat  10 Minutes    Moist Heat Location  Shoulder      LUE Fluidotherapy   Number Minutes Fluidotherapy  10 Minutes    LUE Fluidotherapy Location  Hand;Wrist;Forearm    Comments  prior to treatment to reduce pain / stiffness             OT Education - 01/16/18 1646    Education Details  reviewed short and long term goals, and overall OT plan of care.  Work station modifications / Sales executive) Educated  Patient    Methods  Explanation;Demonstration;Verbal cues    Comprehension  Verbalized understanding;Returned demonstration;Need further instruction       OT  Short Term Goals - 01/16/18 1549      OT SHORT TERM GOAL #1   Title  Patient will complete a home exercise program to address coordination in LUE DUE 01/26/18    Time  4    Period  Weeks    Status  Achieved      OT SHORT TERM GOAL #2   Title  Patient will complete a home exercise program designed to improve AROM in left shoulder, wrist and digits    Status  Achieved      OT SHORT TERM GOAL #3   Title  Patient will utilize LUE to pick up lightweight objects (less than 2 lbs) from low reach position with min cueing.      Time  4    Period  Weeks    Status  On-going      OT SHORT TERM GOAL #4   Title  Patient will demonstrate awareness of positions which exagerate her symptoms of numbness    Time  4    Period  Weeks    Status  On-going        OT Long Term Goals - 01/16/18 1555      OT LONG TERM GOAL #1   Title  Patient will complete an HEP designed to improve grip and pinch strength due 02/25/18    Time  8    Period  Weeks    Status  Achieved      OT LONG TERM GOAL #2   Title  Patient will demonstrate a 5 second reduction in time to complete LUE 9 hole peg test to aide with coordiantion for ADL     Time  8    Period  Weeks    Status  Achieved      OT LONG TERM GOAL #3   Title  Patient will button her pants using BUE technique    Time  8    Period  Weeks    Status  On-going      OT LONG TERM GOAL #4   Title  Patient will chop a fruit or vegetable using BUE technique    Time  8    Period  Weeks    Status  On-going      OT LONG TERM GOAL #5   Title  Patient will return to needlepoint activity using BUE technique    Time  8    Period  Weeks    Status  On-going              Patient will benefit from skilled therapeutic intervention in order to improve the following deficits and impairments:     Visit Diagnosis: Other disturbances of skin sensation  Muscle weakness (generalized)  Stiffness of left shoulder, not elsewhere classified  Pain in left  hand  Other lack of coordination    Problem List Patient Active Problem List   Diagnosis Date Noted  . UTI symptoms 08/25/2017  . Dysmenorrhea 02/25/2017  . Non-intractable vomiting with nausea 09/24/2016  . Screening for cervical cancer 02/27/2016  . Neuropathy, diabetic ([place]) 05/21/2014  . Abnormal nuclear stress test   . Abnormal uterine bleeding 12/14/2011  . Obstructive sleep apnea 04/16/2011  . HOT FLASHES 10/17/2009  . ROTATOR CUFF INJURY, LEFT SHOULDER 02/27/2009  . CERVICAL RADICULOPATHY, LEFT 02/21/2009  . Depression, unipolar ([place]) 09/03/2008  . TRANSAMINASES, SERUM, ELEVATED 06/26/2008  . POSTURAL HYPOTENSION 04/02/2008  . Diabetes mellitus without complication ([place]) 49/70/2637  . DISORDER, DYSMETABOLIC SYNDROME X 85/88/5027  . SPRAIN/STRAIN, LUMBAR REGION 09/15/2006  . HYPERTRIGLYCERIDEMIA 07/05/2006  . KNEE PAIN, LEFT, CHRONIC 06/02/2006  . OBESITY, NOS 04/14/2006    Mariah Milling, OTR/L 01/16/2018, 4:50 PM  [facility] [address] [place], [place], [address] Phone: [phone]   Fax:  [phone]  Name: Anne Daniels MRN: [id number] Date of Birth: [birth year]

## 2018-01-20 ENCOUNTER — Ambulatory Visit: Payer: 59 | Admitting: Occupational Therapy

## 2018-01-20 ENCOUNTER — Encounter: Payer: Self-pay | Admitting: Occupational Therapy

## 2018-01-20 DIAGNOSIS — M79642 Pain in left hand: Secondary | ICD-10-CM

## 2018-01-20 DIAGNOSIS — R208 Other disturbances of skin sensation: Secondary | ICD-10-CM | POA: Diagnosis not present

## 2018-01-20 DIAGNOSIS — M25612 Stiffness of left shoulder, not elsewhere classified: Secondary | ICD-10-CM

## 2018-01-20 DIAGNOSIS — M6281 Muscle weakness (generalized): Secondary | ICD-10-CM

## 2018-01-20 DIAGNOSIS — R278 Other lack of coordination: Secondary | ICD-10-CM

## 2018-01-20 NOTE — Therapy (Signed)
[facility] [address] [place], [place], [address] Phone: [phone]   Fax:  [phone]  Occupational Therapy Treatment  Patient Details  Name: Anne Daniels MRN: [id number] Date of Birth: [birth year] Referring Provider (OT): Dr Barbaraann Barthel   Encounter Date: 01/20/2018  OT End of Session - 01/20/18 1635    Visit Number  5    Number of Visits  [place] Number  5    Authorization - Number of Visits  66    OT Start Time  5465    OT Stop Time  1616    OT Time Calculation (min)  44 min    Activity Tolerance  Patient tolerated treatment well    Behavior During Therapy  [facility] for tasks assessed/performed       Past Medical History:  Diagnosis Date  . Diabetes mellitus without complication ([place])   . Migraines   . Syncope and collapse     Past Surgical History:  Procedure Laterality Date  . KNEE SURGERY    . LEFT HEART CATHETERIZATION WITH CORONARY ANGIOGRAM N/A 04/05/2014   Procedure: LEFT HEART CATHETERIZATION WITH CORONARY ANGIOGRAM;  Surgeon: Blane Ohara, MD;  Location: [facility] CATH LAB;  Service: Cardiovascular;  Laterality: N/A;  . WISDOM TOOTH EXTRACTION      There were no vitals filed for this visit.  Subjective Assessment - 01/20/18 1536    Subjective   Patient with increased pain in shoulder  (Pended)     Pertinent History  syncope  (Pended)     Currently in Pain?  Yes  (Pended)     Pain Score  6   (Pended)     Pain Location  Shoulder  (Pended)     Pain Orientation  Left  (Pended)     Pain Descriptors / Indicators  Aching  (Pended)     Pain Type  Acute pain  (Pended)     Pain Onset  More than a month ago  (Pended)                    OT Treatments/Exercises (OP) - 01/20/18 0001      Modalities   Modalities  Moist Heat      Moist Heat Therapy   Number Minutes Moist Heat  10 Minutes    Moist Heat Location  Shoulder   left - during FM  coord exercisess     Manual Therapy   Manual Therapy  Myofascial release;Scapular mobilization;Soft tissue mobilization    Soft tissue mobilization  Patient with trigger point at spine of scapula left UE.  Friction massage to address restrictionj, followed with scapular mobilization.  Patient needing cueing to separate head  and neck from shoulder motion.        Fine Motor Coordination (Hand/Wrist)   Fine Motor Coordination  O'Connor pegs    O'Connor pegs  left UE with emphasis on graded pressure to adjust position             OT Education - 01/20/18 1633    Education Details  theracane for left shoulder     Person(s) Educated  Patient    Methods  Explanation;Demonstration;Verbal cues    Comprehension  Verbalized understanding;Returned demonstration       OT Short Term Goals - 01/20/18 1640      OT SHORT TERM GOAL #4   Title  Patient will demonstrate awareness of positions which  exagerate her symptoms of numbness    Time  4    Period  Weeks    Status  Achieved        OT Long Term Goals - 01/16/18 1555      OT LONG TERM GOAL #1   Title  Patient will complete an HEP designed to improve grip and pinch strength due 02/25/18    Time  8    Period  Weeks    Status  Achieved      OT LONG TERM GOAL #2   Title  Patient will demonstrate a 5 second reduction in time to complete LUE 9 hole peg test to aide with coordiantion for ADL     Time  8    Period  Weeks    Status  Achieved      OT LONG TERM GOAL #3   Title  Patient will button her pants using BUE technique    Time  8    Period  Weeks    Status  On-going      OT LONG TERM GOAL #4   Title  Patient will chop a fruit or vegetable using BUE technique    Time  8    Period  Weeks    Status  On-going      OT LONG TERM GOAL #5   Title  Patient will return to needlepoint activity using BUE technique    Time  8    Period  Weeks    Status  On-going            Plan - 01/20/18 1637    Clinical Impression  Statement  Patient is showing improvement with fine motor coordination in left hand, however left shoulder pain continues to persist.  Pursuing PT orders for dry needling.      Occupational Profile and client history currently impacting functional performance  Wife, employee, student    Occupational performance deficits (Please refer to evaluation for details):  ADL's;IADL's;Rest and Sleep    Rehab Potential  Good    OT Frequency  2x / week    OT Duration  8 weeks    OT Treatment/Interventions  Self-care/ADL training;Electrical Stimulation;Iontophoresis;Therapeutic exercise;Aquatic Therapy;Moist Heat;Paraffin;Neuromuscular education;Splinting;Fluidtherapy;Energy conservation;Therapist, nutritional;Therapeutic activities;Cryotherapy;Ultrasound;Contrast Bath;DME and/or AE instruction;Manual Therapy;Passive range of motion    Plan  coordination, pain reduction and stretching to LUE    Clinical Decision Making  Limited treatment options, no task modification necessary    Consulted and Agree with Plan of Care  Patient       Patient will benefit from skilled therapeutic intervention in order to improve the following deficits and impairments:  Decreased range of motion, Decreased strength, Increased muscle spasms, Impaired tone, Impaired UE functional use, Impaired perceived functional ability, Decreased mobility, Impaired sensation, Improper body mechanics, Decreased knowledge of use of DME, Decreased coordination, Decreased activity tolerance, Increased edema, Impaired flexibility  Visit Diagnosis: Other disturbances of skin sensation  Muscle weakness (generalized)  Stiffness of left shoulder, not elsewhere classified  Pain in left hand  Other lack of coordination    Problem List Patient Active Problem List   Diagnosis Date Noted  . UTI symptoms 08/25/2017  . Dysmenorrhea 02/25/2017  . Non-intractable vomiting with nausea 09/24/2016  . Screening for cervical cancer 02/27/2016  .  Neuropathy, diabetic ([place]) 05/21/2014  . Abnormal nuclear stress test   . Abnormal uterine bleeding 12/14/2011  . Obstructive sleep apnea 04/16/2011  . HOT FLASHES 10/17/2009  . ROTATOR CUFF INJURY, LEFT SHOULDER 02/27/2009  .  CERVICAL RADICULOPATHY, LEFT 02/21/2009  . Depression, unipolar ([place]) 09/03/2008  . TRANSAMINASES, SERUM, ELEVATED 06/26/2008  . POSTURAL HYPOTENSION 04/02/2008  . Diabetes mellitus without complication ([place]) 18/55/0158  . DISORDER, DYSMETABOLIC SYNDROME X 68/25/7493  . SPRAIN/STRAIN, LUMBAR REGION 09/15/2006  . HYPERTRIGLYCERIDEMIA 07/05/2006  . KNEE PAIN, LEFT, CHRONIC 06/02/2006  . OBESITY, NOS 04/14/2006    Mariah Milling, OTR/L 01/20/2018, 4:41 PM  [facility] [address] [place], [place], [address] Phone: [phone]   Fax:  [phone]  Name: KARELLY DEWALT MRN: [id number] Date of Birth: 19-Oct-1975

## 2018-01-22 ENCOUNTER — Other Ambulatory Visit: Payer: Self-pay | Admitting: Family Medicine

## 2018-01-23 NOTE — Addendum Note (Signed)
Addended by: Sherrie George F on: 01/23/2018 09:26 AM   Modules accepted: Orders

## 2018-01-24 ENCOUNTER — Ambulatory Visit: Payer: 59 | Admitting: Occupational Therapy

## 2018-01-27 ENCOUNTER — Ambulatory Visit: Payer: 59 | Admitting: Occupational Therapy

## 2018-01-31 ENCOUNTER — Ambulatory Visit: Payer: 59 | Admitting: Occupational Therapy

## 2018-02-01 ENCOUNTER — Encounter: Payer: Self-pay | Admitting: Family Medicine

## 2018-02-01 ENCOUNTER — Ambulatory Visit (INDEPENDENT_AMBULATORY_CARE_PROVIDER_SITE_OTHER): Payer: 59 | Admitting: Family Medicine

## 2018-02-01 VITALS — BP 147/92 | Ht 67.0 in | Wt 280.0 lb

## 2018-02-01 DIAGNOSIS — R200 Anesthesia of skin: Secondary | ICD-10-CM | POA: Diagnosis not present

## 2018-02-01 NOTE — Patient Instructions (Signed)
Continue physical therapy and home exercises. I'll send a message to your therapist - I think with the history of syncope, potential seizure activity you should hold off on the dry needling. Continue gabapentin at 1 tablet in the morning, 2 tablets in afternoon, 2 in evening. Continue the robaxin as needed for spasms. Follow up with me in 2 months for reevaluation.

## 2018-02-02 ENCOUNTER — Encounter: Payer: Self-pay | Admitting: Family Medicine

## 2018-02-02 NOTE — Progress Notes (Signed)
PCP: Kathrene Alu, MD  Subjective:   HPI: Patient is a 42 y.o. female here for bilateral arm numbness.  10/9: Patient reports she's been under a lot of stress lately. Closing on house she is currently leasing and things came up, has been postponed again to January. She states she woke up last Wednesday morning with numbness and no feeling in both hands. Distribution of entire right hand, left hand up to the shoulder. Right bicep feels a little sore, some pain in left shoulder area. No neck pain though states holds tension in neck area bilaterally. Went to Urgent Care and given prednisone dose pack, has been taking aleve which don't help. No family history of rheumatoid arthritis, other inflammatory arthropathies. Pain up to 9/10 in left shoulder area at its worst and sharp. No weakness, has not noted changes in speech, vision, or other complaints other than above.  11/6: Patient reports she has improved compared to last visit. Symptoms into right arm are better. Still having numbness in left bicep area and left hand to thumb-3rd digit. Swelling has improved also. Has been taking gabapentin 386m tid and has helped some. Diclofenac did not seem to help so stopped this medication. Taking robaxin at night which helps her sleep. No bowel/bladder dysfunction. Pain level at 6/10 level into left arm, sharp. Massage helped temporarily.  12/18: Patient reports she's improving. Still with some numbness in left thumb through middle digits but sensation in palm is better. She's doing well with physical therapy - massage has been helpful. Taking gabapentin 1 qam, 2 in afternoon, 2 at bedtime. Using theracare. No skin changes. No new injuries.  Past Medical History:  Diagnosis Date  . Diabetes mellitus without complication ([place]   . Migraines   . Syncope and collapse     Current Outpatient Medications on File Prior to Visit  Medication Sig Dispense Refill  . Blood Glucose  Monitoring Suppl (ONETOUCH VERIO) w/Device KIT 1 Device by Does not apply route once a week. 1 kit 0  . diclofenac (VOLTAREN) 75 MG EC tablet TAKE 1 TABLET BY MOUTH TWICE DAILY 60 tablet 0  . fluconazole (DIFLUCAN) 150 MG tablet Take 1 dose and if symptoms continue take the other dose 3 days later 2 tablet 0  . gabapentin (NEURONTIN) 300 MG capsule Take 1 capsule every morning, 2 capsules in afternoon, 2 capsules in evening. 150 capsule 2  . glucose blood (ONETOUCH VERIO) test strip Check blood sugar 6 x daily 25 each 3  . Lancet Devices (ONE TOUCH DELICA LANCING DEV) MISC 1 each by Does not apply route once a week. 1 each 1  . losartan (COZAAR) 25 MG tablet Take 1 tablet (25 mg total) by mouth daily. 90 tablet 3  . metFORMIN (GLUCOPHAGE) 1000 MG tablet take 1 tablet by mouth twice a day WITH A MEAL 180 tablet 3  . methocarbamol (ROBAXIN) 500 MG tablet Take 1 tablet (500 mg total) by mouth every 8 (eight) hours as needed. 60 tablet 1  . NON FORMULARY Cranberry pill    . omeprazole (PRILOSEC) 40 MG capsule take 1 capsule by mouth once daily 30 capsule 5  . ONETOUCH DELICA LANCETS 323FMISC USE AS DIRECTED 100 each 0  . PARoxetine (PAXIL-CR) 37.5 MG 24 hr tablet Take 1 tablet (37.5 mg total) by mouth daily. 30 tablet 5   No current facility-administered medications on file prior to visit.     Past Surgical History:  Procedure Laterality Date  . KNEE SURGERY    .  LEFT HEART CATHETERIZATION WITH CORONARY ANGIOGRAM N/A 04/05/2014   Procedure: LEFT HEART CATHETERIZATION WITH CORONARY ANGIOGRAM;  Surgeon: Blane Ohara, MD;  Location: [facility] CATH LAB;  Service: Cardiovascular;  Laterality: N/A;  . WISDOM TOOTH EXTRACTION      Allergies  Allergen Reactions  . Penicillins Hives    Social History   Socioeconomic History  . Marital status: Married    Spouse name: Not on file  . Number of children: Not on file  . Years of education: Not on file  . Highest education level: Not on file   Occupational History  . Not on file  Social Needs  . Financial resource strain: Not on file  . Food insecurity:    Worry: Not on file    Inability: Not on file  . Transportation needs:    Medical: Not on file    Non-medical: Not on file  Tobacco Use  . Smoking status: Never Smoker  . Smokeless tobacco: Never Used  Substance and Sexual Activity  . Alcohol use: No  . Drug use: No  . Sexual activity: Yes    Birth control/protection: Pill  Lifestyle  . Physical activity:    Days per week: Not on file    Minutes per session: Not on file  . Stress: Not on file  Relationships  . Social connections:    Talks on phone: Not on file    Gets together: Not on file    Attends religious service: Not on file    Active member of club or organization: Not on file    Attends meetings of clubs or organizations: Not on file    Relationship status: Not on file  . Intimate partner violence:    Fear of current or ex partner: Not on file    Emotionally abused: Not on file    Physically abused: Not on file    Forced sexual activity: Not on file  Other Topics Concern  . Not on file  Social History Narrative  . Not on file    Family History  Problem Relation Age of Onset  . Diabetes Mother   . Hyperlipidemia Mother   . Hypertension Mother   . Hypertension Father   . Hyperlipidemia Brother   . Hypertension Brother   . Sudden death Neg Hx   . Heart attack Neg Hx     BP (!) 147/92   Ht 5' 7"  (1.702 m)   Wt 280 lb (127 kg)   BMI 43.85 kg/m   Review of Systems: See HPI above.     Objective:  Physical Exam:  Gen: NAD, comfortable in exam room  Neck: No gross deformity, swelling, bruising. TTP mildly left trapezius, rhomboids.  No midline/bony TTP. FROM without pain. BUE strength 5/5.   Sensation diminished left 1st-3rd digits. 1 + MSRs left brachioradialis and biceps, 2+ triceps, 2+ right brachioradialis, biceps, triceps. 2+ radial pulses.   Assessment & Plan:  1.  Bilateral arm numbness - right side improved.  Still with numbness in C6-7 distribution but slowly improving.  Continue gabapentin and robaxin.  Continue therapy.  I had initially approved dry needling but she reported remembering when she's had injection in past she had syncopal episode and seizure activity.  Given she's improving slowly without this I recommended avoiding dry needling at this time.  F/u in 2 months.

## 2018-02-03 ENCOUNTER — Encounter: Payer: Self-pay | Admitting: Occupational Therapy

## 2018-02-03 ENCOUNTER — Ambulatory Visit: Payer: 59 | Admitting: Occupational Therapy

## 2018-02-03 ENCOUNTER — Ambulatory Visit: Payer: 59 | Admitting: Physical Therapy

## 2018-02-03 DIAGNOSIS — M79642 Pain in left hand: Secondary | ICD-10-CM

## 2018-02-03 DIAGNOSIS — M6281 Muscle weakness (generalized): Secondary | ICD-10-CM

## 2018-02-03 DIAGNOSIS — R278 Other lack of coordination: Secondary | ICD-10-CM

## 2018-02-03 DIAGNOSIS — R208 Other disturbances of skin sensation: Secondary | ICD-10-CM | POA: Diagnosis not present

## 2018-02-03 DIAGNOSIS — M25612 Stiffness of left shoulder, not elsewhere classified: Secondary | ICD-10-CM

## 2018-02-03 NOTE — Therapy (Signed)
[facility] [address] [place], [place], [address] Phone: [phone]   Fax:  [phone]  Occupational Therapy Treatment  Patient Details  Name: Anne Daniels MRN: [id number] Date of Birth: 1975-05-22 Referring Provider (OT): Dr Barbaraann Barthel   Encounter Date: 02/03/2018  OT End of Session - 02/03/18 1739    Visit Number  6    Number of Visits  [place] Number  6    Authorization - Number of Visits  74    OT Start Time  3299    OT Stop Time  1616    OT Time Calculation (min)  42 min    Activity Tolerance  Patient tolerated treatment well    Behavior During Therapy  [facility] for tasks assessed/performed       Past Medical History:  Diagnosis Date  . Diabetes mellitus without complication ([place])   . Migraines   . Syncope and collapse     Past Surgical History:  Procedure Laterality Date  . KNEE SURGERY    . LEFT HEART CATHETERIZATION WITH CORONARY ANGIOGRAM N/A 04/05/2014   Procedure: LEFT HEART CATHETERIZATION WITH CORONARY ANGIOGRAM;  Surgeon: Blane Ohara, MD;  Location: [facility] CATH LAB;  Service: Cardiovascular;  Laterality: N/A;  . WISDOM TOOTH EXTRACTION      There were no vitals filed for this visit.  Subjective Assessment - 02/03/18 1542    Subjective   Patient shared that she had a massage last week - deep tissue     Pertinent History  syncope    Currently in Pain?  Yes    Pain Score  5     Pain Location  Shoulder    Pain Orientation  Left    Pain Descriptors / Indicators  Aching    Pain Type  Acute pain    Pain Onset  More than a month ago    Pain Frequency  Intermittent                   OT Treatments/Exercises (OP) - 02/03/18 0001      ADLs   Grooming  Still having difficulty putting in earrings with post and back    LB Dressing  Still having difficulty with fastening pants    ADL Comments  Improved overall functional use of left  hand- Sensation improving in palm, and base of long finger - still impaired in thumb , index and tip of long finger.      Neurological Re-education Exercises   Other Exercises 1  Working to increase strength and mobility in posterior aspect of shoulder girdle.  Worked on scapula mobilization and then resisted scap motion elevation depression, abd/add.  Patient needs frequent cues and facilitation to separate head and shoulder girdle movement.        Moist Heat Therapy   Number Minutes Moist Heat  10 Minutes    Moist Heat Location  Shoulder      LUE Fluidotherapy   Number Minutes Fluidotherapy  10 Minutes    LUE Fluidotherapy Location  Hand;Wrist;Forearm    Comments  to reduce pain - started at 5 - ended at 3.5      Manual Therapy   Manual Therapy  Myofascial release    Myofascial Release  Patient with trigger point proximal end of spine of scapula                OT Short Term  Goals - 02/03/18 1547      OT SHORT TERM GOAL #1   Title  Patient will complete a home exercise program to address coordination in LUE DUE 01/26/18    Time  4    Period  Weeks    Status  Achieved      OT SHORT TERM GOAL #2   Title  Patient will complete a home exercise program designed to improve AROM in left shoulder, wrist and digits    Time  4    Period  Weeks    Status  Achieved      OT SHORT TERM GOAL #3   Title  Patient will utilize LUE to pick up lightweight objects (less than 2 lbs) from low reach position with min cueing.      Status  Achieved      OT SHORT TERM GOAL #4   Title  Patient will demonstrate awareness of positions which exagerate her symptoms of numbness    Time  4    Period  Weeks    Status  Achieved        OT Long Term Goals - 02/03/18 1549      OT LONG TERM GOAL #1   Title  Patient will complete an HEP designed to improve grip and pinch strength due 02/25/18  (Pended)     Time  8  (Pended)     Period  Weeks  (Pended)     Status  Achieved  (Pended)       OT  LONG TERM GOAL #2   Title  Patient will demonstrate a 5 second reduction in time to complete LUE 9 hole peg test to aide with coordiantion for ADL   (Pended)     Time  8  (Pended)     Period  Weeks  (Pended)     Status  Achieved  (Pended)       OT LONG TERM GOAL #3   Title  Patient will button her pants using BUE technique  (Pended)     Time  8  (Pended)     Period  Weeks  (Pended)     Status  On-going  (Pended)       OT LONG TERM GOAL #4   Title  Patient will chop a fruit or vegetable using BUE technique  (Pended)     Time  8  (Pended)     Period  Weeks  (Pended)     Status  On-going  (Pended)       OT LONG TERM GOAL #5   Title  Patient will return to needlepoint activity using BUE technique  (Pended)     Time  8  (Pended)     Period  Weeks  (Pended)     Status  On-going  (Pended)             Plan - 02/03/18 1739    Clinical Impression Statement  Patient is showing improvement with use of left hand - but continues to have shoulder pain, although is lessening overtime.      Occupational Profile and client history currently impacting functional performance  Wife, employee, student    Occupational performance deficits (Please refer to evaluation for details):  ADL's;IADL's;Rest and Sleep    Rehab Potential  Good    OT Frequency  2x / week    OT Duration  8 weeks    OT Treatment/Interventions  Self-care/ADL training;Electrical Stimulation;Iontophoresis;Therapeutic exercise;Aquatic Therapy;Moist Heat;Paraffin;Neuromuscular education;Splinting;Fluidtherapy;Energy conservation;Therapist, nutritional;Therapeutic  activities;Cryotherapy;Ultrasound;Contrast Bath;DME and/or AE instruction;Manual Therapy;Passive range of motion    Plan  posterior shoulder range and strengthening - consider yoga poses for postural muscles and shoulder mobilization.      Clinical Decision Making  Limited treatment options, no task modification necessary    Consulted and Agree with Plan of Care   Patient       Patient will benefit from skilled therapeutic intervention in order to improve the following deficits and impairments:  Decreased range of motion, Decreased strength, Increased muscle spasms, Impaired tone, Impaired UE functional use, Impaired perceived functional ability, Decreased mobility, Impaired sensation, Improper body mechanics, Decreased knowledge of use of DME, Decreased coordination, Decreased activity tolerance, Increased edema, Impaired flexibility  Visit Diagnosis: Other disturbances of skin sensation  Muscle weakness (generalized)  Stiffness of left shoulder, not elsewhere classified  Pain in left hand  Other lack of coordination    Problem List Patient Active Problem List   Diagnosis Date Noted  . UTI symptoms 08/25/2017  . Dysmenorrhea 02/25/2017  . Non-intractable vomiting with nausea 09/24/2016  . Screening for cervical cancer 02/27/2016  . Neuropathy, diabetic ([place]) 05/21/2014  . Abnormal nuclear stress test   . Abnormal uterine bleeding 12/14/2011  . Obstructive sleep apnea 04/16/2011  . HOT FLASHES 10/17/2009  . ROTATOR CUFF INJURY, LEFT SHOULDER 02/27/2009  . CERVICAL RADICULOPATHY, LEFT 02/21/2009  . Depression, unipolar ([place]) 09/03/2008  . TRANSAMINASES, SERUM, ELEVATED 06/26/2008  . POSTURAL HYPOTENSION 04/02/2008  . Diabetes mellitus without complication ([place]) 65/99/3570  . DISORDER, DYSMETABOLIC SYNDROME X 17/79/3903  . SPRAIN/STRAIN, LUMBAR REGION 09/15/2006  . HYPERTRIGLYCERIDEMIA 07/05/2006  . KNEE PAIN, LEFT, CHRONIC 06/02/2006  . OBESITY, NOS 04/14/2006    Mariah Milling, OTR/L 02/03/2018, 5:43 PM  [facility] [address] [place], [place], [address] Phone: [phone]   Fax:  [phone]  Name: Anne Daniels MRN: [id number] Date of Birth: [birth year]

## 2018-02-06 ENCOUNTER — Ambulatory Visit: Payer: 59 | Admitting: Occupational Therapy

## 2018-02-07 ENCOUNTER — Ambulatory Visit: Payer: 59 | Admitting: Occupational Therapy

## 2018-02-10 ENCOUNTER — Encounter: Payer: 59 | Admitting: Occupational Therapy

## 2018-02-14 ENCOUNTER — Ambulatory Visit: Payer: 59 | Admitting: Occupational Therapy

## 2018-02-14 ENCOUNTER — Encounter: Payer: Self-pay | Admitting: Occupational Therapy

## 2018-02-14 DIAGNOSIS — M6281 Muscle weakness (generalized): Secondary | ICD-10-CM

## 2018-02-14 DIAGNOSIS — M79642 Pain in left hand: Secondary | ICD-10-CM

## 2018-02-14 DIAGNOSIS — R278 Other lack of coordination: Secondary | ICD-10-CM

## 2018-02-14 DIAGNOSIS — R208 Other disturbances of skin sensation: Secondary | ICD-10-CM | POA: Diagnosis not present

## 2018-02-14 DIAGNOSIS — M25612 Stiffness of left shoulder, not elsewhere classified: Secondary | ICD-10-CM

## 2018-02-14 NOTE — Therapy (Signed)
[facility] [address] [place], [place], [address] Phone: [phone]   Fax:  [phone]  Occupational Therapy Treatment  Patient Details  Name: Anne Daniels MRN: [id number] Date of Birth: 08-13-1975 Referring Provider (OT): Dr Barbaraann Barthel   Encounter Date: 02/14/2018  OT End of Session - 02/14/18 1543    Visit Number  7    Number of Visits  [place] Number  7    Authorization - Number of Visits  72    OT Start Time  1536    OT Stop Time  1615    OT Time Calculation (min)  39 min       Past Medical History:  Diagnosis Date  . Diabetes mellitus without complication ([place])   . Migraines   . Syncope and collapse     Past Surgical History:  Procedure Laterality Date  . KNEE SURGERY    . LEFT HEART CATHETERIZATION WITH CORONARY ANGIOGRAM N/A 04/05/2014   Procedure: LEFT HEART CATHETERIZATION WITH CORONARY ANGIOGRAM;  Surgeon: Blane Ohara, MD;  Location: [facility] CATH LAB;  Service: Cardiovascular;  Laterality: N/A;  . WISDOM TOOTH EXTRACTION      There were no vitals filed for this visit.  Subjective Assessment - 02/14/18 1542    Pertinent History  syncope    Currently in Pain?  Yes    Pain Score  4     Pain Location  Shoulder    Pain Orientation  Left    Pain Descriptors / Indicators  Aching    Pain Type  Acute pain    Pain Onset  More than a month ago    Pain Frequency  Intermittent    Aggravating Factors   malpositioning    Pain Relieving Factors  massage    Multiple Pain Sites  No              Treatment: Supine closed chain shoulder flexion x 10 reps min v.c Quadraped for cat and cow, transitioned to standing at tabletop rocking forwards and back wards for modified child's pose/ cobra x 10 reps Wall pushups x 10 reps followed by prone bilateral shoulder ext x 10 reps then unilateral left shoulder extension with 1 lbs weight, min v.c Seated at  tabletop hotpack to left shoulder(x 10 mins) while performing median n. Glides followed by stinging beads using bilateral UE's, min difficulty in prep for return to [facility].(No adverse reactions to heat)               OT Short Term Goals - 02/03/18 1547      OT SHORT TERM GOAL #1   Title  Patient will complete a home exercise program to address coordination in LUE DUE 01/26/18    Time  4    Period  Weeks    Status  Achieved      OT SHORT TERM GOAL #2   Title  Patient will complete a home exercise program designed to improve AROM in left shoulder, wrist and digits    Time  4    Period  Weeks    Status  Achieved      OT SHORT TERM GOAL #3   Title  Patient will utilize LUE to pick up lightweight objects (less than 2 lbs) from low reach position with min cueing.      Status  Achieved      OT SHORT TERM GOAL #4  Title  Patient will demonstrate awareness of positions which exagerate her symptoms of numbness    Time  4    Period  Weeks    Status  Achieved        OT Long Term Goals - 02/14/18 1606      OT LONG TERM GOAL #1   Title  Patient will complete an HEP designed to improve grip and pinch strength due 02/25/18    Time  8    Period  Weeks    Status  Achieved      OT LONG TERM GOAL #2   Title  Patient will demonstrate a 5 second reduction in time to complete LUE 9 hole peg test to aide with coordiantion for ADL     Time  8    Period  Weeks    Status  Achieved      OT LONG TERM GOAL #3   Title  Patient will button her pants using BUE technique    Time  8    Period  Weeks    Status  On-going      OT LONG TERM GOAL #4   Title  Patient will chop a fruit or vegetable using BUE technique    Time  8    Period  Weeks    Status  On-going      OT LONG TERM GOAL #5   Title  Patient will return to needlepoint activity using BUE technique    Time  8    Period  Weeks    Status  On-going            Plan - 02/14/18 1543    Clinical Impression Statement   Patient is progessing towards goals with decreasing pain and increasing LUE functional use    Occupational Profile and client history currently impacting functional performance  Wife, employee, student    Occupational performance deficits (Please refer to evaluation for details):  ADL's;IADL's;Rest and Sleep    Rehab Potential  Good    OT Frequency  2x / week    OT Duration  8 weeks    OT Treatment/Interventions  Self-care/ADL training;Electrical Stimulation;Iontophoresis;Therapeutic exercise;Aquatic Therapy;Moist Heat;Paraffin;Neuromuscular education;Splinting;Fluidtherapy;Energy conservation;Therapist, nutritional;Therapeutic activities;Cryotherapy;Ultrasound;Contrast Bath;DME and/or AE instruction;Manual Therapy;Passive range of motion    Plan  continue-posterior shoulder range and strengthening - consider yoga poses for postural muscles and shoulder mobilization.      Consulted and Agree with Plan of Care  Patient       Patient will benefit from skilled therapeutic intervention in order to improve the following deficits and impairments:  Decreased range of motion, Decreased strength, Increased muscle spasms, Impaired tone, Impaired UE functional use, Impaired perceived functional ability, Decreased mobility, Impaired sensation, Improper body mechanics, Decreased knowledge of use of DME, Decreased coordination, Decreased activity tolerance, Increased edema, Impaired flexibility  Visit Diagnosis: Other disturbances of skin sensation  Muscle weakness (generalized)  Stiffness of left shoulder, not elsewhere classified  Pain in left hand  Other lack of coordination    Problem List Patient Active Problem List   Diagnosis Date Noted  . UTI symptoms 08/25/2017  . Dysmenorrhea 02/25/2017  . Non-intractable vomiting with nausea 09/24/2016  . Screening for cervical cancer 02/27/2016  . Neuropathy, diabetic ([place]) 05/21/2014  . Abnormal nuclear stress test   . Abnormal uterine  bleeding 12/14/2011  . Obstructive sleep apnea 04/16/2011  . HOT FLASHES 10/17/2009  . ROTATOR CUFF INJURY, LEFT SHOULDER 02/27/2009  . CERVICAL RADICULOPATHY, LEFT 02/21/2009  . Depression,  unipolar ([place]) 09/03/2008  . TRANSAMINASES, SERUM, ELEVATED 06/26/2008  . POSTURAL HYPOTENSION 04/02/2008  . Diabetes mellitus without complication ([place]) 77/41/4239  . DISORDER, DYSMETABOLIC SYNDROME X 53/20/2334  . SPRAIN/STRAIN, LUMBAR REGION 09/15/2006  . HYPERTRIGLYCERIDEMIA 07/05/2006  . KNEE PAIN, LEFT, CHRONIC 06/02/2006  . OBESITY, NOS 04/14/2006    Rosey Eide 02/14/2018, 4:23 PM  [facility] [address] [place], [place], [address] Phone: [phone]   Fax:  [phone]  Name: Anne Daniels MRN: [id number] Date of Birth: 1975-10-19

## 2018-02-17 ENCOUNTER — Ambulatory Visit: Payer: 59 | Admitting: Occupational Therapy

## 2018-02-21 ENCOUNTER — Encounter: Payer: Self-pay | Admitting: Occupational Therapy

## 2018-02-21 ENCOUNTER — Ambulatory Visit: Payer: 59 | Attending: Internal Medicine | Admitting: Occupational Therapy

## 2018-02-21 DIAGNOSIS — R278 Other lack of coordination: Secondary | ICD-10-CM | POA: Diagnosis present

## 2018-02-21 DIAGNOSIS — M79642 Pain in left hand: Secondary | ICD-10-CM | POA: Diagnosis present

## 2018-02-21 DIAGNOSIS — M25612 Stiffness of left shoulder, not elsewhere classified: Secondary | ICD-10-CM | POA: Diagnosis present

## 2018-02-21 DIAGNOSIS — M6281 Muscle weakness (generalized): Secondary | ICD-10-CM | POA: Diagnosis present

## 2018-02-21 DIAGNOSIS — R208 Other disturbances of skin sensation: Secondary | ICD-10-CM

## 2018-02-21 NOTE — Patient Instructions (Signed)
11. Grip Strengthening (Resistive Putty)   Squeeze putty using thumb and all fingers. Repeat 15 times. Do 1-2 sessions per day.   Extension (Assistive Putty)   Roll putty back and forth, being sure to use all fingertips. Repeat 3 times. Do 1-2 sessions per day.  Then pinch as below.   Palmar Pinch Strengthening (Resistive Putty)   Pinch putty between thumb and each fingertip in turn after rolling out              MP Flexion (Resistive Putty)   Bending only at large knuckles, press putty down against thumb. Keep fingertips straight. Repeat 10 times. Do 1-2 sessions per day.   Lateral Pinch Strengthening (Resistive Putty)    Squeeze between thumb and side of each finger in turn. Repeat 10 times. Do 1-2 sessions per day.   FINGERS: Extension (Putty)    Open hand and fingers to flatten putty. 10 reps per set, 1-2 sets per day, 7 days per week

## 2018-02-21 NOTE — Therapy (Signed)
[facility] [address] [place], [place], [address] Phone: [phone]   Fax:  [phone]  Occupational Therapy Treatment  Patient Details  Name: Anne Daniels MRN: [id number] Date of Birth: Jun 19, 1975 Referring Provider (OT): Dr Barbaraann Barthel   Encounter Date: 02/21/2018  OT End of Session - 02/21/18 1635    Visit Number  8    Number of Visits  [place] Number  8    Authorization - Number of Visits  99    OT Start Time  4401    OT Stop Time  1615    OT Time Calculation (min)  38 min    Activity Tolerance  Patient tolerated treatment well    Behavior During Therapy  [facility] for tasks assessed/performed       Past Medical History:  Diagnosis Date  . Diabetes mellitus without complication ([place])   . Migraines   . Syncope and collapse     Past Surgical History:  Procedure Laterality Date  . KNEE SURGERY    . LEFT HEART CATHETERIZATION WITH CORONARY ANGIOGRAM N/A 04/05/2014   Procedure: LEFT HEART CATHETERIZATION WITH CORONARY ANGIOGRAM;  Surgeon: Blane Ohara, MD;  Location: [facility] CATH LAB;  Service: Cardiovascular;  Laterality: N/A;  . WISDOM TOOTH EXTRACTION      There were no vitals filed for this visit.  Subjective Assessment - 02/21/18 1540    Subjective   I can button my pants, I can hook my bra    Pertinent History  syncope    Currently in Pain?  Yes    Pain Score  5     Pain Location  Shoulder    Pain Orientation  Left    Pain Descriptors / Indicators  Aching    Pain Type  Chronic pain    Pain Onset  More than a month ago                   OT Treatments/Exercises (OP) - 02/21/18 0001      ADLs   LB Dressing  I can button my pants now!      Cooking  Patient is working on being able to cut/chop fruits and vegetables.      Work  Discussed multiple options for work station.  Patient has sit to stand desk, and now finds discomfort mainly with  seated work.  She is looking into new office chair.  Encouraged her to look for height adjustable with adjustable arm rests.      Leisure  Patient enjoys needlepoint, but is not yet able to return to this hobby.  Discussed starting with larger medium - plastic canvas to help start to work on freedom of movement and forearm, hand conditioning.  Patient in agreement, and was going to try to that later this week.        Theraputty   Theraputty - Flatten  red - left hand    Theraputty - Roll  red - left hand    Theraputty - Grip  red - left hand    Theraputty - Pinch  red- left hand    Theraputty Hand- Locate Pegs  red - left hand             OT Education - 02/21/18 1634    Education Details  theraputty - red    Person(s) Educated  Patient    Methods  Explanation;Demonstration;Verbal cues;Handout  Comprehension  Verbalized understanding;Returned demonstration       OT Short Term Goals - 02/03/18 1547      OT SHORT TERM GOAL #1   Title  Patient will complete a home exercise program to address coordination in LUE DUE 01/26/18    Time  4    Period  Weeks    Status  Achieved      OT SHORT TERM GOAL #2   Title  Patient will complete a home exercise program designed to improve AROM in left shoulder, wrist and digits    Time  4    Period  Weeks    Status  Achieved      OT SHORT TERM GOAL #3   Title  Patient will utilize LUE to pick up lightweight objects (less than 2 lbs) from low reach position with min cueing.      Status  Achieved      OT SHORT TERM GOAL #4   Title  Patient will demonstrate awareness of positions which exagerate her symptoms of numbness    Time  4    Period  Weeks    Status  Achieved        OT Long Term Goals - 02/21/18 1613      OT LONG TERM GOAL #3   Title  Patient will button her pants using BUE technique  (Pended)     Status  Achieved  (Pended)             Plan - 02/21/18 1635    Clinical Impression Statement  Patient has met another  long term goal, and reports improving sensation left hand    Occupational Profile and client history currently impacting functional performance  Wife, employee, student    Occupational performance deficits (Please refer to evaluation for details):  ADL's;IADL's;Rest and Sleep    Rehab Potential  Good    OT Frequency  2x / week    OT Duration  8 weeks    OT Treatment/Interventions  Self-care/ADL training;Electrical Stimulation;Iontophoresis;Therapeutic exercise;Aquatic Therapy;Moist Heat;Paraffin;Neuromuscular education;Splinting;Fluidtherapy;Energy conservation;Therapist, nutritional;Therapeutic activities;Cryotherapy;Ultrasound;Contrast Bath;DME and/or AE instruction;Manual Therapy;Passive range of motion    Plan  continue-posterior shoulder range and strengthening - consider yoga poses for postural muscles and shoulder mobilization.      Clinical Decision Making  Limited treatment options, no task modification necessary    Consulted and Agree with Plan of Care  Patient       Patient will benefit from skilled therapeutic intervention in order to improve the following deficits and impairments:  Decreased range of motion, Decreased strength, Increased muscle spasms, Impaired tone, Impaired UE functional use, Impaired perceived functional ability, Decreased mobility, Impaired sensation, Improper body mechanics, Decreased knowledge of use of DME, Decreased coordination, Decreased activity tolerance, Increased edema, Impaired flexibility  Visit Diagnosis: Other disturbances of skin sensation  Muscle weakness (generalized)  Stiffness of left shoulder, not elsewhere classified  Pain in left hand  Other lack of coordination    Problem List Patient Active Problem List   Diagnosis Date Noted  . UTI symptoms 08/25/2017  . Dysmenorrhea 02/25/2017  . Non-intractable vomiting with nausea 09/24/2016  . Screening for cervical cancer 02/27/2016  . Neuropathy, diabetic ([place]) 05/21/2014  .  Abnormal nuclear stress test   . Abnormal uterine bleeding 12/14/2011  . Obstructive sleep apnea 04/16/2011  . HOT FLASHES 10/17/2009  . ROTATOR CUFF INJURY, LEFT SHOULDER 02/27/2009  . CERVICAL RADICULOPATHY, LEFT 02/21/2009  . Depression, unipolar ([place]) 09/03/2008  . TRANSAMINASES, SERUM, ELEVATED 06/26/2008  .  POSTURAL HYPOTENSION 04/02/2008  . Diabetes mellitus without complication ([place]) 09/98/3382  . DISORDER, DYSMETABOLIC SYNDROME X 50/53/9767  . SPRAIN/STRAIN, LUMBAR REGION 09/15/2006  . HYPERTRIGLYCERIDEMIA 07/05/2006  . KNEE PAIN, LEFT, CHRONIC 06/02/2006  . OBESITY, NOS 04/14/2006    Mariah Milling, OTR/L 02/21/2018, 4:37 PM  [facility] [address] [place], [place], [address] Phone: [phone]   Fax:  [phone]  Name: Anne Daniels MRN: [id number] Date of Birth: [birth year]

## 2018-02-24 ENCOUNTER — Encounter: Payer: 59 | Admitting: Occupational Therapy

## 2018-02-24 ENCOUNTER — Other Ambulatory Visit: Payer: Self-pay | Admitting: Family Medicine

## 2018-02-28 ENCOUNTER — Ambulatory Visit: Payer: 59 | Admitting: Occupational Therapy

## 2018-02-28 ENCOUNTER — Encounter: Payer: Self-pay | Admitting: Occupational Therapy

## 2018-02-28 DIAGNOSIS — R208 Other disturbances of skin sensation: Secondary | ICD-10-CM

## 2018-02-28 DIAGNOSIS — M25612 Stiffness of left shoulder, not elsewhere classified: Secondary | ICD-10-CM

## 2018-02-28 DIAGNOSIS — R278 Other lack of coordination: Secondary | ICD-10-CM

## 2018-02-28 DIAGNOSIS — M79642 Pain in left hand: Secondary | ICD-10-CM

## 2018-02-28 DIAGNOSIS — M6281 Muscle weakness (generalized): Secondary | ICD-10-CM

## 2018-02-28 NOTE — Therapy (Signed)
[facility] [address] [place], [place], [address] Phone: [phone]   Fax:  [phone]  Occupational Therapy Treatment  Patient Details  Name: Anne Daniels MRN: [id number] Date of Birth: 10/11/1975 Referring Provider (OT): Dr Barbaraann Barthel   Encounter Date: 02/28/2018  OT End of Session - 02/28/18 1651    Visit Number  9    Number of Visits  [place] - Visit Number  9    Authorization - Number of Visits  18    OT Start Time  0258    OT Stop Time  1623    OT Time Calculation (min)  50 min    Activity Tolerance  Patient tolerated treatment well    Behavior During Therapy  [facility] for tasks assessed/performed       Past Medical History:  Diagnosis Date  . Diabetes mellitus without complication ([place])   . Migraines   . Syncope and collapse     Past Surgical History:  Procedure Laterality Date  . KNEE SURGERY    . LEFT HEART CATHETERIZATION WITH CORONARY ANGIOGRAM N/A 04/05/2014   Procedure: LEFT HEART CATHETERIZATION WITH CORONARY ANGIOGRAM;  Surgeon: Blane Ohara, MD;  Location: [facility] CATH LAB;  Service: Cardiovascular;  Laterality: N/A;  . WISDOM TOOTH EXTRACTION      There were no vitals filed for this visit.  Subjective Assessment - 02/28/18 1542    Subjective   I need to have a hysterectomy    Pertinent History  syncope    Currently in Pain?  Yes    Pain Score  6     Pain Location  Shoulder    Pain Orientation  Left    Pain Descriptors / Indicators  Aching    Pain Type  Chronic pain    Pain Onset  More than a month ago    Pain Frequency  Intermittent                   OT Treatments/Exercises (OP) - 02/28/18 0001      Neurological Re-education Exercises   Other Exercises 1  Posterior shoulder girdle stretching in pain free range.  Worked to facilitate sustained muscle activation into surface during rolling activities.  Worked on core activation  through rolling onto left arm.        Sensation Exercises   Sensory Retraining  Patient reports that her thumb sensation has returned      Moist Heat Therapy   Number Minutes Moist Heat  10 Minutes    Moist Heat Location  Shoulder               OT Short Term Goals - 02/03/18 1547      OT SHORT TERM GOAL #1   Title  Patient will complete a home exercise program to address coordination in LUE DUE 01/26/18    Time  4    Period  Weeks    Status  Achieved      OT SHORT TERM GOAL #2   Title  Patient will complete a home exercise program designed to improve AROM in left shoulder, wrist and digits    Time  4    Period  Weeks    Status  Achieved      OT SHORT TERM GOAL #3   Title  Patient will utilize LUE to pick up lightweight objects (less than 2 lbs) from low reach position with min  cueing.      Status  Achieved      OT SHORT TERM GOAL #4   Title  Patient will demonstrate awareness of positions which exagerate her symptoms of numbness    Time  4    Period  Weeks    Status  Achieved        OT Long Term Goals - 02/21/18 1613      OT LONG TERM GOAL #3   Title  Patient will button her pants using BUE technique  (Pended)     Status  Achieved  (Pended)             Plan - 02/28/18 1651    Clinical Impression Statement  Patient reports improved sensation left thumb.  Patient reporting feelings of stress relating to upcoming surgery.      Occupational Profile and client history currently impacting functional performance  Wife, employee, student    Occupational performance deficits (Please refer to evaluation for details):  ADL's;IADL's;Rest and Sleep    Rehab Potential  Good    OT Frequency  2x / week    OT Duration  8 weeks    OT Treatment/Interventions  Self-care/ADL training;Electrical Stimulation;Iontophoresis;Therapeutic exercise;Aquatic Therapy;Moist Heat;Paraffin;Neuromuscular education;Splinting;Fluidtherapy;Energy conservation;Wellsite geologist;Therapeutic activities;Cryotherapy;Ultrasound;Contrast Bath;DME and/or AE instruction;Manual Therapy;Passive range of motion    Plan  continue-posterior shoulder range and strengthening - consider yoga poses for postural muscles and shoulder mobilization.      Clinical Decision Making  Limited treatment options, no task modification necessary    Consulted and Agree with Plan of Care  Patient       Patient will benefit from skilled therapeutic intervention in order to improve the following deficits and impairments:  Decreased range of motion, Decreased strength, Increased muscle spasms, Impaired tone, Impaired UE functional use, Impaired perceived functional ability, Decreased mobility, Impaired sensation, Improper body mechanics, Decreased knowledge of use of DME, Decreased coordination, Decreased activity tolerance, Increased edema, Impaired flexibility  Visit Diagnosis: Other disturbances of skin sensation  Muscle weakness (generalized)  Stiffness of left shoulder, not elsewhere classified  Pain in left hand  Other lack of coordination    Problem List Patient Active Problem List   Diagnosis Date Noted  . UTI symptoms 08/25/2017  . Dysmenorrhea 02/25/2017  . Non-intractable vomiting with nausea 09/24/2016  . Screening for cervical cancer 02/27/2016  . Neuropathy, diabetic ([place]) 05/21/2014  . Abnormal nuclear stress test   . Abnormal uterine bleeding 12/14/2011  . Obstructive sleep apnea 04/16/2011  . HOT FLASHES 10/17/2009  . ROTATOR CUFF INJURY, LEFT SHOULDER 02/27/2009  . CERVICAL RADICULOPATHY, LEFT 02/21/2009  . Depression, unipolar ([place]) 09/03/2008  . TRANSAMINASES, SERUM, ELEVATED 06/26/2008  . POSTURAL HYPOTENSION 04/02/2008  . Diabetes mellitus without complication ([place]) 02/77/4128  . DISORDER, DYSMETABOLIC SYNDROME X 78/67/6720  . SPRAIN/STRAIN, LUMBAR REGION 09/15/2006  . HYPERTRIGLYCERIDEMIA 07/05/2006  . KNEE PAIN, LEFT, CHRONIC 06/02/2006  . OBESITY,  NOS 04/14/2006    Mariah Milling, OTR/L 02/28/2018, 4:53 PM  [facility] [address] [place], [place], [address] Phone: [phone]   Fax:  [phone]  Name: MAICEE ULLMAN MRN: [id number] Date of Birth: 1976/02/05

## 2018-03-03 ENCOUNTER — Encounter: Payer: Self-pay | Admitting: Occupational Therapy

## 2018-03-03 ENCOUNTER — Ambulatory Visit: Payer: 59 | Admitting: Occupational Therapy

## 2018-03-03 DIAGNOSIS — R208 Other disturbances of skin sensation: Secondary | ICD-10-CM

## 2018-03-03 DIAGNOSIS — M6281 Muscle weakness (generalized): Secondary | ICD-10-CM

## 2018-03-03 DIAGNOSIS — R278 Other lack of coordination: Secondary | ICD-10-CM

## 2018-03-03 DIAGNOSIS — M79642 Pain in left hand: Secondary | ICD-10-CM

## 2018-03-03 DIAGNOSIS — M25612 Stiffness of left shoulder, not elsewhere classified: Secondary | ICD-10-CM

## 2018-03-03 NOTE — Therapy (Signed)
[facility] [address] [place], [place], [address] Phone: [phone]   Fax:  [phone]  Occupational Therapy Treatment  Patient Details  Name: Anne Daniels MRN: [id number] Date of Birth: 1975-07-17 Referring Provider (OT): Dr Barbaraann Barthel   Encounter Date: 03/03/2018  OT End of Session - 03/03/18 1625    Visit Number  10    Number of Visits  [place] - Visit Number  10    Authorization - Number of Visits  18    OT Start Time  6270    OT Stop Time  1620    OT Time Calculation (min)  47 min    Activity Tolerance  Patient tolerated treatment well    Behavior During Therapy  [facility] for tasks assessed/performed       Past Medical History:  Diagnosis Date  . Diabetes mellitus without complication ([place])   . Migraines   . Syncope and collapse     Past Surgical History:  Procedure Laterality Date  . KNEE SURGERY    . LEFT HEART CATHETERIZATION WITH CORONARY ANGIOGRAM N/A 04/05/2014   Procedure: LEFT HEART CATHETERIZATION WITH CORONARY ANGIOGRAM;  Surgeon: Blane Ohara, MD;  Location: [facility] CATH LAB;  Service: Cardiovascular;  Laterality: N/A;  . WISDOM TOOTH EXTRACTION      There were no vitals filed for this visit.  Subjective Assessment - 03/03/18 1603    Subjective   Patient excited at moving closer to closing on home.    Pertinent History  syncope    Currently in Pain?  Yes    Pain Score  4     Pain Location  Shoulder    Pain Orientation  Left    Pain Descriptors / Indicators  Aching    Pain Type  Chronic pain    Pain Onset  More than a month ago    Pain Frequency  Intermittent    Aggravating Factors   malpositioning                   OT Treatments/Exercises (OP) - 03/03/18 0001      Neurological Re-education Exercises   Other Exercises 1  Prone resistance training for posterior shoulder with yellow theraband- chest press , abduction, extension x  10 reps x 2 sets.                 OT Short Term Goals - 02/03/18 1547      OT SHORT TERM GOAL #1   Title  Patient will complete a home exercise program to address coordination in LUE DUE 01/26/18    Time  4    Period  Weeks    Status  Achieved      OT SHORT TERM GOAL #2   Title  Patient will complete a home exercise program designed to improve AROM in left shoulder, wrist and digits    Time  4    Period  Weeks    Status  Achieved      OT SHORT TERM GOAL #3   Title  Patient will utilize LUE to pick up lightweight objects (less than 2 lbs) from low reach position with min cueing.      Status  Achieved      OT SHORT TERM GOAL #4   Title  Patient will demonstrate awareness of positions which exagerate her symptoms of numbness    Time  4  Period  Weeks    Status  Achieved        OT Long Term Goals - 02/21/18 1613      OT LONG TERM GOAL #3   Title  Patient will button her pants using BUE technique  (Pended)     Status  Achieved  (Pended)             Plan - 03/03/18 1626    Clinical Impression Statement  Patient reports changing sensation in index and long.      Occupational Profile and client history currently impacting functional performance  Wife, employee, student    Occupational performance deficits (Please refer to evaluation for details):  ADL's;IADL's;Rest and Sleep    Rehab Potential  Good    OT Frequency  2x / week    OT Duration  8 weeks    OT Treatment/Interventions  Self-care/ADL training;Electrical Stimulation;Iontophoresis;Therapeutic exercise;Aquatic Therapy;Moist Heat;Paraffin;Neuromuscular education;Splinting;Fluidtherapy;Energy conservation;Therapist, nutritional;Therapeutic activities;Cryotherapy;Ultrasound;Contrast Bath;DME and/or AE instruction;Manual Therapy;Passive range of motion    Plan  consider ultrasound for posterior shoulder just over spine of scapula    Clinical Decision Making  Limited treatment options, no task  modification necessary    Consulted and Agree with Plan of Care  Patient       Patient will benefit from skilled therapeutic intervention in order to improve the following deficits and impairments:  Decreased range of motion, Decreased strength, Increased muscle spasms, Impaired tone, Impaired UE functional use, Impaired perceived functional ability, Decreased mobility, Impaired sensation, Improper body mechanics, Decreased knowledge of use of DME, Decreased coordination, Decreased activity tolerance, Increased edema, Impaired flexibility  Visit Diagnosis: Other disturbances of skin sensation  Muscle weakness (generalized)  Stiffness of left shoulder, not elsewhere classified  Pain in left hand  Other lack of coordination    Problem List Patient Active Problem List   Diagnosis Date Noted  . UTI symptoms 08/25/2017  . Dysmenorrhea 02/25/2017  . Non-intractable vomiting with nausea 09/24/2016  . Screening for cervical cancer 02/27/2016  . Neuropathy, diabetic ([place]) 05/21/2014  . Abnormal nuclear stress test   . Abnormal uterine bleeding 12/14/2011  . Obstructive sleep apnea 04/16/2011  . HOT FLASHES 10/17/2009  . ROTATOR CUFF INJURY, LEFT SHOULDER 02/27/2009  . CERVICAL RADICULOPATHY, LEFT 02/21/2009  . Depression, unipolar ([place]) 09/03/2008  . TRANSAMINASES, SERUM, ELEVATED 06/26/2008  . POSTURAL HYPOTENSION 04/02/2008  . Diabetes mellitus without complication ([place]) 73/42/8768  . DISORDER, DYSMETABOLIC SYNDROME X 11/57/2620  . SPRAIN/STRAIN, LUMBAR REGION 09/15/2006  . HYPERTRIGLYCERIDEMIA 07/05/2006  . KNEE PAIN, LEFT, CHRONIC 06/02/2006  . OBESITY, NOS 04/14/2006    Mariah Milling, OTR/L 03/03/2018, 4:32 PM  [facility] [address] [place], [place], [address] Phone: [phone]   Fax:  [phone]  Name: MAYLEEN BORRERO MRN: [id number] Date of Birth: 07-Jan-1976

## 2018-03-06 ENCOUNTER — Ambulatory Visit: Payer: 59 | Admitting: Occupational Therapy

## 2018-03-06 DIAGNOSIS — M25612 Stiffness of left shoulder, not elsewhere classified: Secondary | ICD-10-CM

## 2018-03-06 DIAGNOSIS — M6281 Muscle weakness (generalized): Secondary | ICD-10-CM

## 2018-03-06 DIAGNOSIS — R208 Other disturbances of skin sensation: Secondary | ICD-10-CM

## 2018-03-06 NOTE — Therapy (Signed)
[facility] [address] [place], [place], [address] Phone: [phone]   Fax:  [phone]  Occupational Therapy Treatment  Patient Details  Name: Anne Daniels MRN: [id number] Date of Birth: [birth year] Referring Provider (OT): Dr Barbaraann Barthel   Encounter Date: 03/06/2018  OT End of Session - 03/06/18 1714    Visit Number  11    Number of Visits  [place] - Visit Number  11    Authorization - Number of Visits  66    OT Start Time  0762    OT Stop Time  1615    OT Time Calculation (min)  41 min    Activity Tolerance  Patient tolerated treatment well    Behavior During Therapy  [facility] for tasks assessed/performed       Past Medical History:  Diagnosis Date  . Diabetes mellitus without complication ([place])   . Migraines   . Syncope and collapse     Past Surgical History:  Procedure Laterality Date  . KNEE SURGERY    . LEFT HEART CATHETERIZATION WITH CORONARY ANGIOGRAM N/A 04/05/2014   Procedure: LEFT HEART CATHETERIZATION WITH CORONARY ANGIOGRAM;  Surgeon: Blane Ohara, MD;  Location: [facility] CATH LAB;  Service: Cardiovascular;  Laterality: N/A;  . WISDOM TOOTH EXTRACTION      There were no vitals filed for this visit.  Subjective Assessment - 03/06/18 1720    Subjective   Pt reports tingling in her hand    Currently in Pain?  Yes    Pain Score  4     Pain Location  Shoulder    Pain Orientation  Left    Pain Descriptors / Indicators  Aching    Pain Type  Chronic pain    Pain Onset  More than a month ago    Pain Frequency  Intermittent    Aggravating Factors   malpositioning    Pain Relieving Factors  massage                   Treatment: [place] 34mhz, 0.8 w/cm 2, 20% x 8 mins to border of left scapula/ posterior shoulder followed by [place] to left forearm x 8 mins same parameters. Pt reports tingling in her hand afterwards and decreased pain. Quadraped on mat cat/ cow  position followed by child's pose and cobra, pt reports too much stretch with cobra pose, so transitioned to prone on elbows with pt performing scapular retraction/ protraction, and diagonal reaching. Reviewed prone theraband HEP, 10 reps each with yellow band, pt demonstrates good understanding of exercises.           OT Short Term Goals - 02/03/18 1547      OT SHORT TERM GOAL #1   Title  Patient will complete a home exercise program to address coordination in LUE DUE 01/26/18    Time  4    Period  Weeks    Status  Achieved      OT SHORT TERM GOAL #2   Title  Patient will complete a home exercise program designed to improve AROM in left shoulder, wrist and digits    Time  4    Period  Weeks    Status  Achieved      OT SHORT TERM GOAL #3   Title  Patient will utilize LUE to pick up lightweight objects (less than 2 lbs) from low reach position with min cueing.  Status  Achieved      OT SHORT TERM GOAL #4   Title  Patient will demonstrate awareness of positions which exagerate her symptoms of numbness    Time  4    Period  Weeks    Status  Achieved        OT Long Term Goals - 02/21/18 1613      OT LONG TERM GOAL #3   Title  Patient will button her pants using BUE technique  (Pended)     Status  Achieved  (Pended)             Plan - 03/06/18 1715    Clinical Impression Statement  Pt reports the [place] decreased pain around left shoulder blade today and causes tingling in her hand.    Occupational Profile and client history currently impacting functional performance  Wife, employee, student    Occupational performance deficits (Please refer to evaluation for details):  ADL's;IADL's;Rest and Sleep    Rehab Potential  Good    OT Frequency  2x / week    OT Duration  8 weeks    OT Treatment/Interventions  Self-care/ADL training;Electrical Stimulation;Iontophoresis;Therapeutic exercise;Aquatic Therapy;Moist Heat;Paraffin;Neuromuscular  education;Splinting;Fluidtherapy;Energy conservation;Therapist, nutritional;Therapeutic activities;Cryotherapy;Ultrasound;Contrast Bath;DME and/or AE instruction;Manual Therapy;Passive range of motion    Plan  continue [place] to posterior shoulder, consider [place] to forearm, gentle stretching, scapular stability    Consulted and Agree with Plan of Care  Patient       Patient will benefit from skilled therapeutic intervention in order to improve the following deficits and impairments:  Decreased range of motion, Decreased strength, Increased muscle spasms, Impaired tone, Impaired UE functional use, Impaired perceived functional ability, Decreased mobility, Impaired sensation, Improper body mechanics, Decreased knowledge of use of DME, Decreased coordination, Decreased activity tolerance, Increased edema, Impaired flexibility  Visit Diagnosis: Other disturbances of skin sensation  Muscle weakness (generalized)  Stiffness of left shoulder, not elsewhere classified    Problem List Patient Active Problem List   Diagnosis Date Noted  . UTI symptoms 08/25/2017  . Dysmenorrhea 02/25/2017  . Non-intractable vomiting with nausea 09/24/2016  . Screening for cervical cancer 02/27/2016  . Neuropathy, diabetic ([place]) 05/21/2014  . Abnormal nuclear stress test   . Abnormal uterine bleeding 12/14/2011  . Obstructive sleep apnea 04/16/2011  . HOT FLASHES 10/17/2009  . ROTATOR CUFF INJURY, LEFT SHOULDER 02/27/2009  . CERVICAL RADICULOPATHY, LEFT 02/21/2009  . Depression, unipolar ([place]) 09/03/2008  . TRANSAMINASES, SERUM, ELEVATED 06/26/2008  . POSTURAL HYPOTENSION 04/02/2008  . Diabetes mellitus without complication ([place]) 00/71/2197  . DISORDER, DYSMETABOLIC SYNDROME X 58/83/2549  . SPRAIN/STRAIN, LUMBAR REGION 09/15/2006  . HYPERTRIGLYCERIDEMIA 07/05/2006  . KNEE PAIN, LEFT, CHRONIC 06/02/2006  . OBESITY, NOS 04/14/2006    RINE,KATHRYN 03/06/2018, 5:27 PM  [facility] [address] [place], [place], [address] Phone: [phone]   Fax:  [phone]  Name: Anne Daniels MRN: [id number] Date of Birth: 1976-02-05

## 2018-03-14 ENCOUNTER — Ambulatory Visit: Payer: 59 | Admitting: Occupational Therapy

## 2018-03-17 ENCOUNTER — Ambulatory Visit: Payer: 59 | Admitting: Occupational Therapy

## 2018-03-21 ENCOUNTER — Ambulatory Visit: Payer: 59 | Admitting: Occupational Therapy

## 2018-03-24 ENCOUNTER — Ambulatory Visit: Payer: 59 | Attending: Internal Medicine | Admitting: Occupational Therapy

## 2018-03-24 ENCOUNTER — Encounter: Payer: Self-pay | Admitting: Occupational Therapy

## 2018-03-24 DIAGNOSIS — R278 Other lack of coordination: Secondary | ICD-10-CM | POA: Diagnosis present

## 2018-03-24 DIAGNOSIS — M79642 Pain in left hand: Secondary | ICD-10-CM | POA: Insufficient documentation

## 2018-03-24 DIAGNOSIS — R208 Other disturbances of skin sensation: Secondary | ICD-10-CM

## 2018-03-24 DIAGNOSIS — M25612 Stiffness of left shoulder, not elsewhere classified: Secondary | ICD-10-CM

## 2018-03-24 DIAGNOSIS — M6281 Muscle weakness (generalized): Secondary | ICD-10-CM | POA: Diagnosis present

## 2018-03-24 NOTE — Therapy (Signed)
[facility] [address] [place], [place], [address] Phone: [phone]   Fax:  [phone]  Occupational Therapy Treatment  Patient Details  Name: Anne Daniels MRN: [id number] Date of Birth: [birth year] Referring Provider (OT): Dr Barbaraann Barthel   Encounter Date: 03/24/2018  OT End of Session - 03/24/18 1635    Visit Number  12    Number of Visits  [place] - Visit Number  12    Authorization - Number of Visits  32    OT Start Time  9201    OT Stop Time  0071    OT Time Calculation (min)  38 min    Activity Tolerance  Patient tolerated treatment well    Behavior During Therapy  [facility] for tasks assessed/performed       Past Medical History:  Diagnosis Date  . Diabetes mellitus without complication ([place])   . Migraines   . Syncope and collapse     Past Surgical History:  Procedure Laterality Date  . KNEE SURGERY    . LEFT HEART CATHETERIZATION WITH CORONARY ANGIOGRAM N/A 04/05/2014   Procedure: LEFT HEART CATHETERIZATION WITH CORONARY ANGIOGRAM;  Surgeon: Blane Ohara, MD;  Location: [facility] CATH LAB;  Service: Cardiovascular;  Laterality: N/A;  . WISDOM TOOTH EXTRACTION      There were no vitals filed for this visit.  Subjective Assessment - 03/24/18 1546    Subjective   I have feeling in my fingers again - only the tips have remaining numbness    Pertinent History  syncope    Currently in Pain?  Yes    Pain Score  3     Pain Location  Shoulder    Pain Orientation  Left    Pain Descriptors / Indicators  Aching    Pain Type  Chronic pain    Pain Onset  More than a month ago    Pain Frequency  Intermittent                   OT Treatments/Exercises (OP) - 03/24/18 0001      ADLs   Leisure  Patient reports sensation in left fingers improved to the point she could complete needlepoint activity.  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        Exercises   Exercises  Shoulder      Shoulder Exercises: Prone   Other Prone Exercises  Reviewed prone theraband exercises.  Reviewed importance of working at right tension, and how to increase / decrease tension in band.  (yellow)      Ultrasound   Ultrasound Location  shoulder- posterior at base of spine of scapula, forearm- lateral aspect of dorsum left    Ultrasound Parameters  41mhz, 0.8w/cm2, 20% x 68min shoulder, same parameters lateral forearm    Ultrasound Goals  Edema;Pain             OT Education - 03/24/18 1635    Education Details  reviewed prone theraband exercises    Person(s) Educated  Patient    Methods  Explanation    Comprehension  Verbalized understanding       OT Short Term Goals - 02/03/18 1547      OT SHORT TERM GOAL #1   Title  Patient will complete a home exercise program to address coordination in LUE DUE 01/26/18  Time  4    Period  Weeks    Status  Achieved      OT SHORT TERM GOAL #2   Title  Patient will complete a home exercise program designed to improve AROM in left shoulder, wrist and digits    Time  4    Period  Weeks    Status  Achieved      OT SHORT TERM GOAL #3   Title  Patient will utilize LUE to pick up lightweight objects (less than 2 lbs) from low reach position with min cueing.      Status  Achieved      OT SHORT TERM GOAL #4   Title  Patient will demonstrate awareness of positions which exagerate her symptoms of numbness    Time  4    Period  Weeks    Status  Achieved        OT Long Term Goals - 02/21/18 1613      OT LONG TERM GOAL #3   Title  Patient will button her pants using BUE technique  (Pended)     Status  Achieved  (Pended)             Plan - 03/24/18 1635    Clinical Impression Statement  Patient reports continued improvement in left hand functioning as she continues to have return of sensation in fingertips.  Patient still experiencing left shoulder discomfort.       Occupational Profile and client history currently impacting functional performance  Wife, employee, student    Occupational performance deficits (Please refer to evaluation for details):  ADL's;IADL's;Rest and Sleep    Rehab Potential  Good    OT Frequency  2x / week    OT Duration  8 weeks    OT Treatment/Interventions  Self-care/ADL training;Electrical Stimulation;Iontophoresis;Therapeutic exercise;Aquatic Therapy;Moist Heat;Paraffin;Neuromuscular education;Splinting;Fluidtherapy;Energy conservation;Therapist, nutritional;Therapeutic activities;Cryotherapy;Ultrasound;Contrast Bath;DME and/or AE instruction;Manual Therapy;Passive range of motion    Plan  continue [place] to posterior shoulder, consider [place] to forearm, gentle stretching, scapular stability    Clinical Decision Making  Limited treatment options, no task modification necessary    Consulted and Agree with Plan of Care  Patient       Patient will benefit from skilled therapeutic intervention in order to improve the following deficits and impairments:  Decreased range of motion, Decreased strength, Increased muscle spasms, Impaired tone, Impaired UE functional use, Impaired perceived functional ability, Decreased mobility, Impaired sensation, Improper body mechanics, Decreased knowledge of use of DME, Decreased coordination, Decreased activity tolerance, Increased edema, Impaired flexibility  Visit Diagnosis: Other disturbances of skin sensation  Muscle weakness (generalized)  Stiffness of left shoulder, not elsewhere classified  Pain in left hand  Other lack of coordination    Problem List Patient Active Problem List   Diagnosis Date Noted  . UTI symptoms 08/25/2017  . Dysmenorrhea 02/25/2017  . Non-intractable vomiting with nausea 09/24/2016  . Screening for cervical cancer 02/27/2016  . Neuropathy, diabetic ([place]) 05/21/2014  . Abnormal nuclear stress test   . Abnormal uterine bleeding 12/14/2011  .  Obstructive sleep apnea 04/16/2011  . HOT FLASHES 10/17/2009  . ROTATOR CUFF INJURY, LEFT SHOULDER 02/27/2009  . CERVICAL RADICULOPATHY, LEFT 02/21/2009  . Depression, unipolar ([place]) 09/03/2008  . TRANSAMINASES, SERUM, ELEVATED 06/26/2008  . POSTURAL HYPOTENSION 04/02/2008  . Diabetes mellitus without complication ([place]) 02/58/5277  . DISORDER, DYSMETABOLIC SYNDROME X 82/42/3536  . SPRAIN/STRAIN, LUMBAR REGION 09/15/2006  . HYPERTRIGLYCERIDEMIA 07/05/2006  . KNEE PAIN, LEFT, CHRONIC 06/02/2006  .  OBESITY, NOS 04/14/2006    Mariah Milling, OTR/L 03/24/2018, 4:39 PM  [facility] [address] [place], [place], [address] Phone: [phone]   Fax:  [phone]  Name: Anne Daniels MRN: [id number] Date of Birth: [birth year]

## 2018-03-28 ENCOUNTER — Ambulatory Visit: Payer: 59 | Admitting: Occupational Therapy

## 2018-03-29 ENCOUNTER — Encounter (HOSPITAL_COMMUNITY): Payer: Self-pay | Admitting: *Deleted

## 2018-03-29 ENCOUNTER — Other Ambulatory Visit: Payer: Self-pay

## 2018-03-29 ENCOUNTER — Emergency Department (HOSPITAL_COMMUNITY)
Admission: EM | Admit: 2018-03-29 | Discharge: 2018-03-29 | Disposition: A | Discharge status: Home / Self Care | Payer: 59 | Attending: Emergency Medicine | Admitting: Emergency Medicine

## 2018-03-29 DIAGNOSIS — Z7984 Long term (current) use of oral hypoglycemic drugs: Secondary | ICD-10-CM | POA: Insufficient documentation

## 2018-03-29 DIAGNOSIS — Z79899 Other long term (current) drug therapy: Secondary | ICD-10-CM | POA: Insufficient documentation

## 2018-03-29 DIAGNOSIS — E119 Type 2 diabetes mellitus without complications: Secondary | ICD-10-CM | POA: Insufficient documentation

## 2018-03-29 DIAGNOSIS — T7840XA Allergy, unspecified, initial encounter: Secondary | ICD-10-CM

## 2018-03-29 LAB — CBC WITH DIFFERENTIAL/PLATELET
Abs Immature Granulocytes: 0.04 10*3/uL (ref 0.00–0.07)
BASOS ABS: 0.1 10*3/uL (ref 0.0–0.1)
Basophils Relative: 1 %
EOS ABS: 0.4 10*3/uL (ref 0.0–0.5)
EOS PCT: 5 %
HEMATOCRIT: 48.5 % — AB (ref 36.0–46.0)
Hemoglobin: 16.5 g/dL — ABNORMAL HIGH (ref 12.0–15.0)
Immature Granulocytes: 0 %
LYMPHS ABS: 4.1 10*3/uL — AB (ref 0.7–4.0)
Lymphocytes Relative: 45 %
MCH: 30.7 pg (ref 26.0–34.0)
MCHC: 34 g/dL (ref 30.0–36.0)
MCV: 90.3 fL (ref 80.0–100.0)
MONO ABS: 0.7 10*3/uL (ref 0.1–1.0)
Monocytes Relative: 7 %
NRBC: 0 % (ref 0.0–0.2)
Neutro Abs: 3.8 10*3/uL (ref 1.7–7.7)
Neutrophils Relative %: 42 %
Platelets: 232 10*3/uL (ref 150–400)
RBC: 5.37 MIL/uL — ABNORMAL HIGH (ref 3.87–5.11)
RDW: 12.7 % (ref 11.5–15.5)
WBC: 9.2 10*3/uL (ref 4.0–10.5)

## 2018-03-29 LAB — BASIC METABOLIC PANEL
Anion gap: 14 (ref 5–15)
BUN: 8 mg/dL (ref 6–20)
CALCIUM: 8.8 mg/dL — AB (ref 8.9–10.3)
CO2: 16 mmol/L — AB (ref 22–32)
CREATININE: 0.75 mg/dL (ref 0.44–1.00)
Chloride: 106 mmol/L (ref 98–111)
GFR calc non Af Amer: >60 mL/min (ref >60)
Glucose, Bld: 194 mg/dL — ABNORMAL HIGH (ref 70–99)
Potassium: 3.9 mmol/L (ref 3.5–5.1)
Sodium: 136 mmol/L (ref 135–145)

## 2018-03-29 MED ORDER — FAMOTIDINE IN NACL 20-0.9 MG/50ML-% IV SOLN
20.0000 mg | Freq: Once | INTRAVENOUS | Status: AC
  Administered 2018-03-29: 20 mg via INTRAVENOUS
  Filled 2018-03-29: qty 50

## 2018-03-29 MED ORDER — SODIUM CHLORIDE 0.9 % IV SOLN
Freq: Once | INTRAVENOUS | Status: AC
  Administered 2018-03-29: 11:00:00 via INTRAVENOUS

## 2018-03-29 MED ORDER — DIPHENHYDRAMINE HCL 25 MG PO TABS: 25.0000 mg | ORAL_TABLET | Freq: Four times a day (QID) | ORAL | 0 refills | Status: AC

## 2018-03-29 MED ORDER — METHYLPREDNISOLONE SODIUM SUCC 125 MG IJ SOLR
125.0000 mg | Freq: Once | INTRAMUSCULAR | Status: AC
  Administered 2018-03-29: 125 mg via INTRAVENOUS
  Filled 2018-03-29: qty 2

## 2018-03-29 MED ORDER — DIPHENHYDRAMINE HCL 50 MG/ML IJ SOLN
25.0000 mg | Freq: Once | INTRAMUSCULAR | Status: AC
  Administered 2018-03-29: 25 mg via INTRAVENOUS
  Filled 2018-03-29: qty 1

## 2018-03-29 MED ORDER — PREDNISONE 20 MG PO TABS: ORAL_TABLET | ORAL | 0 refills | Status: DC

## 2018-03-29 MED ORDER — FAMOTIDINE 20 MG PO TABS: 20.0000 mg | ORAL_TABLET | Freq: Two times a day (BID) | ORAL | 0 refills | Status: DC

## 2018-03-29 NOTE — ED Notes (Signed)
Ginger ale given to drink. Denies difficulty swallowing or breathing

## 2018-03-29 NOTE — ED Notes (Signed)
Patient states she is feeling better , her tongue still feels numb however not as bad.

## 2018-03-29 NOTE — ED Provider Notes (Signed)
[facility] EMERGENCY DEPARTMENT Provider Note   CSN: [id number] Arrival date & time: 03/29/18  1051     History   Chief Complaint No chief complaint on file.   HPI Anne Daniels is a 43 y.o. female.  The history is provided by the patient and medical records. No language interpreter was used.  Allergic Reaction     43 year old female with hx of DM, migraine presenting with concerns of allergic reaction.  Patient reports she was recently started on progesterone birth control.  This is a second day taking it.  Approximately 45 minutes ago she developed itchiness throughout her body most significant hands and feet, as well as some sensation of tongue swelling changes in her voice.  States her tongue is now numb she knows about dizziness she does not complain of any chest pain, shortness of breath, abdominal pain or wheezing.  She did not notice any rash.  She did take 25 mg of Benadryl prior to arrival.  She denies any other medication changes aside from progesterone.  Denies any dental pain.  She has remote history of penicillin allergies resulting in rash.  She did reach out to her PCP who recommend patient to come to ER.  Past Medical History:  Diagnosis Date  . Diabetes mellitus without complication ([place])   . Migraines   . Syncope and collapse     Patient Active Problem List   Diagnosis Date Noted  . UTI symptoms 08/25/2017  . Dysmenorrhea 02/25/2017  . Non-intractable vomiting with nausea 09/24/2016  . Screening for cervical cancer 02/27/2016  . Neuropathy, diabetic ([place]) 05/21/2014  . Abnormal nuclear stress test   . Abnormal uterine bleeding 12/14/2011  . Obstructive sleep apnea 04/16/2011  . HOT FLASHES 10/17/2009  . ROTATOR CUFF INJURY, LEFT SHOULDER 02/27/2009  . CERVICAL RADICULOPATHY, LEFT 02/21/2009  . Depression, unipolar ([place]) 09/03/2008  . TRANSAMINASES, SERUM, ELEVATED 06/26/2008  . POSTURAL HYPOTENSION 04/02/2008  . Diabetes mellitus  without complication ([place]) 81/85/6314  . DISORDER, DYSMETABOLIC SYNDROME X 97/03/6376  . SPRAIN/STRAIN, LUMBAR REGION 09/15/2006  . HYPERTRIGLYCERIDEMIA 07/05/2006  . KNEE PAIN, LEFT, CHRONIC 06/02/2006  . OBESITY, NOS 04/14/2006    Past Surgical History:  Procedure Laterality Date  . KNEE SURGERY    . LEFT HEART CATHETERIZATION WITH CORONARY ANGIOGRAM N/A 04/05/2014   Procedure: LEFT HEART CATHETERIZATION WITH CORONARY ANGIOGRAM;  Surgeon: Blane Ohara, MD;  Location: [facility] CATH LAB;  Service: Cardiovascular;  Laterality: N/A;  . WISDOM TOOTH EXTRACTION       OB History   No obstetric history on file.      Home Medications    Prior to Admission medications   Medication Sig Start Date End Date Taking? Authorizing Provider  Blood Glucose Monitoring Suppl (ONETOUCH VERIO) w/Device KIT 1 Device by Does not apply route once a week. 06/17/16   Nicolette Bang, DO  diclofenac (VOLTAREN) 75 MG EC tablet TAKE 1 TABLET BY MOUTH TWICE DAILY 01/23/18   Hudnall, Sharyn Lull, MD  fluconazole (DIFLUCAN) 150 MG tablet Take 1 dose and if symptoms continue take the other dose 3 days later 08/24/17   Guadalupe Dawn, MD  gabapentin (NEURONTIN) 300 MG capsule Take 1 capsule every morning, 2 capsules in afternoon, 2 capsules in evening. 01/11/18   Dene Gentry, MD  glucose blood (ONETOUCH VERIO) test strip Check blood sugar 6 x daily 02/10/17   Bufford Lope, DO  Lancet Devices (ONE TOUCH DELICA LANCING DEV) MISC 1 each  by Does not apply route once a week. 06/17/16   Nicolette Bang, DO  losartan (COZAAR) 25 MG tablet Take 1 tablet (25 mg total) by mouth daily. 03/22/17   Nicolette Bang, DO  metFORMIN (GLUCOPHAGE) 1000 MG tablet take 1 tablet by mouth twice a day WITH A MEAL 06/17/17   Nicolette Bang, DO  methocarbamol (ROBAXIN) 500 MG tablet Take 1 tablet (500 mg total) by mouth every 8 (eight) hours as needed. 11/23/17   Hudnall, Sharyn Lull, MD  NON FORMULARY Cranberry  pill    [provider]  omeprazole (PRILOSEC) 40 MG capsule take 1 capsule by mouth once daily 08/05/16   Veatrice Bourbon, MD  [facility] DELICA LANCETS 26J MISC USE AS DIRECTED 03/21/17   Nicolette Bang, DO  PARoxetine (PAXIL-CR) 37.5 MG 24 hr tablet Take 1 tablet (37.5 mg total) by mouth daily. 09/20/17   Kathrene Alu, MD    Family History Family History  Problem Relation Age of Onset  . Diabetes Mother   . Hyperlipidemia Mother   . Hypertension Mother   . Hypertension Father   . Hyperlipidemia Brother   . Hypertension Brother   . Sudden death Neg Hx   . Heart attack Neg Hx     Social History Social History   Tobacco Use  . Smoking status: Never Smoker  . Smokeless tobacco: Never Used  Substance Use Topics  . Alcohol use: No  . Drug use: No     Allergies   Penicillins   Review of Systems Review of Systems  All other systems reviewed and are negative.    Physical Exam Updated Vital Signs BP (!) 138/104 (BP Location: Right Arm) Comment: Notified Hayden(RN)  Pulse (!) 125   Temp 98.5 F (36.9 C) (Oral)   Resp (!) 26   SpO2 97%   Physical Exam Vitals signs and nursing note reviewed.  Constitutional:      General: She is not in acute distress.    Appearance: She is well-developed.  HENT:     Head: Atraumatic.     Comments: Mouth: Normal appearing lips, normal-appearing tongue, no trismus, no edema of the uvula or mucosal rash. Eyes:     Conjunctiva/sclera: Conjunctivae normal.  Neck:     Musculoskeletal: Neck supple.  Cardiovascular:     Rate and Rhythm: Tachycardia present.     Pulses: Normal pulses.     Heart sounds: Normal heart sounds.  Pulmonary:     Effort: Pulmonary effort is normal.     Breath sounds: Normal breath sounds. No stridor. No wheezing.  Abdominal:     Palpations: Abdomen is soft.     Tenderness: There is no abdominal tenderness.  Musculoskeletal:        General: No swelling.  Skin:    Findings: No rash.    Neurological:     Mental Status: She is alert and oriented to person, place, and time.      ED Treatments / Results  Labs (all labs ordered are listed, but only abnormal results are displayed) Labs Reviewed - No data to display  EKG None  Radiology No results found.  Procedures Procedures (including critical care time)  Medications Ordered in ED Medications - No data to display   Initial Impression / Assessment and Plan / ED Course  I have reviewed the triage vital signs and the nursing notes.  Pertinent labs & imaging results that were available during my care of the patient were reviewed  by me and considered in my medical decision making (see chart for details).     BP 110/66   Pulse 80   Temp 98.5 F (36.9 C) (Oral)   Resp 14   Ht _0  (1.702 m)   Wt 123.4 kg   SpO2 95%   BMI 42.60 kg/m    Final Clinical Impressions(s) / ED Diagnoses   Final diagnoses:  Allergic reaction, initial encounter    ED Discharge Orders         Ordered    diphenhydrAMINE (BENADRYL) 25 MG tablet  Every 6 hours     03/29/18 1355    famotidine (PEPCID) 20 MG tablet  2 times daily     03/29/18 1355    predniSONE (DELTASONE) 20 MG tablet     03/29/18 1355         11:20 AM Patient reports itchiness throughout her hands and feet as well as abdominal swelling after taking progesterone medication for the second day.  Symptoms suggestive of allergic reaction.  She is tachycardic and mildly tachypneic initially which is since improved after Benadryl.  Will give Solu-Medrol, Benadryl, Pepcid, IV fluid and will monitor closely.  Doubt anaphylaxis at this time.  1:56 PM Pt have been monitored for the past several hrs without worsening of her sxs.  She felt comfortable going  Home. Return precaution given.    Domenic Moras, PA-C 03/29/18 1357    Isla Pence, MD 03/29/18 1415

## 2018-03-29 NOTE — ED Triage Notes (Signed)
Patient presents to edc/o poss. Allergic reaction, states she started taking progesterol yest and too a 2nd dose this am. States she felt a little light headed last pm, today around 1015 states her tongue felt like it was swelling and she had a fine rash over her body. Able swallow denies resp distress, lungs sound clear bilateral. Voice sounds a little hoarse.

## 2018-03-30 ENCOUNTER — Other Ambulatory Visit: Payer: Self-pay | Admitting: Obstetrics and Gynecology

## 2018-03-31 ENCOUNTER — Ambulatory Visit: Payer: 59 | Admitting: Occupational Therapy

## 2018-04-03 ENCOUNTER — Other Ambulatory Visit: Payer: Self-pay

## 2018-04-03 MED ORDER — LOSARTAN POTASSIUM 25 MG PO TABS: 25.0000 mg | ORAL_TABLET | Freq: Every day | ORAL | 3 refills | Status: DC

## 2018-04-04 ENCOUNTER — Ambulatory Visit: Payer: 59 | Admitting: Occupational Therapy

## 2018-04-07 ENCOUNTER — Ambulatory Visit: Payer: 59 | Admitting: Occupational Therapy

## 2018-04-07 ENCOUNTER — Encounter: Payer: Self-pay | Admitting: Occupational Therapy

## 2018-04-07 DIAGNOSIS — M25612 Stiffness of left shoulder, not elsewhere classified: Secondary | ICD-10-CM

## 2018-04-07 DIAGNOSIS — R278 Other lack of coordination: Secondary | ICD-10-CM

## 2018-04-07 DIAGNOSIS — M6281 Muscle weakness (generalized): Secondary | ICD-10-CM

## 2018-04-07 DIAGNOSIS — R208 Other disturbances of skin sensation: Secondary | ICD-10-CM

## 2018-04-07 DIAGNOSIS — M79642 Pain in left hand: Secondary | ICD-10-CM

## 2018-04-07 NOTE — Therapy (Signed)
[facility] [address] [place], [place], [address] Phone: [phone]   Fax:  [phone]  Occupational Therapy Treatment  Patient Details  Name: Anne Daniels MRN: [id number] Date of Birth: 23-Mar-1975 Referring Provider (OT): Dr Barbaraann Barthel   Encounter Date: 04/07/2018  OT End of Session - 04/07/18 1627    Visit Number  13    Number of Visits  [place] - Visit Number  13    Authorization - Number of Visits  23    OT Start Time  7017    OT Stop Time  1616    OT Time Calculation (min)  43 min    Activity Tolerance  Patient tolerated treatment well    Behavior During Therapy  [facility] for tasks assessed/performed       Past Medical History:  Diagnosis Date  . Diabetes mellitus without complication ([place])   . Migraines   . Syncope and collapse     Past Surgical History:  Procedure Laterality Date  . KNEE SURGERY    . LEFT HEART CATHETERIZATION WITH CORONARY ANGIOGRAM N/A 04/05/2014   Procedure: LEFT HEART CATHETERIZATION WITH CORONARY ANGIOGRAM;  Surgeon: Blane Ohara, MD;  Location: [facility] CATH LAB;  Service: Cardiovascular;  Laterality: N/A;  . WISDOM TOOTH EXTRACTION      There were no vitals filed for this visit.  Subjective Assessment - 04/07/18 1538    Subjective   My husband got me a new desk chair that has been helping my elbow pain.    Pertinent History  syncope    Currently in Pain?  Yes    Pain Score  3     Pain Location  Shoulder    Pain Orientation  Left    Pain Descriptors / Indicators  Aching    Pain Type  Chronic pain    Pain Onset  More than a month ago    Pain Frequency  Intermittent    Aggravating Factors   malpositioning    Pain Relieving Factors  massage    Multiple Pain Sites  No                   OT Treatments/Exercises (OP) - 04/07/18 0001      ADLs   Cooking  Patient reports that she can now cut, chop fruits and vegetables.   She takes extra caution with onions due to slick surface.      Ultrasound   Ultrasound Location  shoulder- posterior , lat forearm    Ultrasound Parameters  32mhz, 0.8w/cm2, x 8 min shoulder, 5 min lateral forearm left     Ultrasound Goals  Pain;Edema      Manual Therapy   Manual Therapy  Taping    Kinesiotex  Create Space;Facilitate Muscle             OT Education - 04/07/18 1626    Education Details  theraband for shoulder alignment - left    Person(s) Educated  Patient    Methods  Explanation;Demonstration    Comprehension  Verbalized understanding;Need further instruction       OT Short Term Goals - 02/03/18 1547      OT SHORT TERM GOAL #1   Title  Patient will complete a home exercise program to address coordination in LUE DUE 01/26/18    Time  4    Period  Weeks    Status  Achieved  OT SHORT TERM GOAL #2   Title  Patient will complete a home exercise program designed to improve AROM in left shoulder, wrist and digits    Time  4    Period  Weeks    Status  Achieved      OT SHORT TERM GOAL #3   Title  Patient will utilize LUE to pick up lightweight objects (less than 2 lbs) from low reach position with min cueing.      Status  Achieved      OT SHORT TERM GOAL #4   Title  Patient will demonstrate awareness of positions which exagerate her symptoms of numbness    Time  4    Period  Weeks    Status  Achieved        OT Long Term Goals - 04/07/18 1544      OT LONG TERM GOAL #3   Title  Patient will button her pants using BUE technique    Status  Achieved      OT LONG TERM GOAL #4   Title  Patient will chop a fruit or vegetable using BUE technique    Status  Achieved      OT LONG TERM GOAL #5   Title  Patient will return to needlepoint activity using BUE technique    Status  On-going            Plan - 04/07/18 1627    Clinical Impression Statement  Patient has had significant gynecological issues which have affected her attendance, and  overall feeling of well being.  Patient is continuing to make progress with functional use of non dominant LUE, and is reporting overall less pain and improved sensation in LUE.      Occupational Profile and client history currently impacting functional performance  Wife, employee, student    Occupational performance deficits (Please refer to evaluation for details):  ADL's;IADL's;Rest and Sleep    Rehab Potential  Good    OT Frequency  2x / week    OT Duration  8 weeks    OT Treatment/Interventions  Self-care/ADL training;Electrical Stimulation;Iontophoresis;Therapeutic exercise;Aquatic Therapy;Moist Heat;Paraffin;Neuromuscular education;Splinting;Fluidtherapy;Energy conservation;Therapist, nutritional;Therapeutic activities;Cryotherapy;Ultrasound;Contrast Bath;DME and/or AE instruction;Manual Therapy;Passive range of motion    Plan  Check how taping felt.  May retape if warranted.  continue [place] to posterior shoulder, consider [place] to forearm, gentle stretching, scapular stability    Clinical Decision Making  Limited treatment options, no task modification necessary    Consulted and Agree with Plan of Care  Patient       Patient will benefit from skilled therapeutic intervention in order to improve the following deficits and impairments:  Decreased range of motion, Decreased strength, Increased muscle spasms, Impaired tone, Impaired UE functional use, Impaired perceived functional ability, Decreased mobility, Impaired sensation, Improper body mechanics, Decreased knowledge of use of DME, Decreased coordination, Decreased activity tolerance, Increased edema, Impaired flexibility  Visit Diagnosis: Other disturbances of skin sensation - Plan: Ot plan of care cert/re-cert  Muscle weakness (generalized) - Plan: Ot plan of care cert/re-cert  Stiffness of left shoulder, not elsewhere classified - Plan: Ot plan of care cert/re-cert  Pain in left hand - Plan: Ot plan of care cert/re-cert  Other  lack of coordination - Plan: Ot plan of care cert/re-cert    Problem List Patient Active Problem List   Diagnosis Date Noted  . UTI symptoms 08/25/2017  . Dysmenorrhea 02/25/2017  . Non-intractable vomiting with nausea 09/24/2016  . Screening for cervical cancer 02/27/2016  .  Neuropathy, diabetic ([place]) 05/21/2014  . Abnormal nuclear stress test   . Abnormal uterine bleeding 12/14/2011  . Obstructive sleep apnea 04/16/2011  . HOT FLASHES 10/17/2009  . ROTATOR CUFF INJURY, LEFT SHOULDER 02/27/2009  . CERVICAL RADICULOPATHY, LEFT 02/21/2009  . Depression, unipolar ([place]) 09/03/2008  . TRANSAMINASES, SERUM, ELEVATED 06/26/2008  . POSTURAL HYPOTENSION 04/02/2008  . Diabetes mellitus without complication ([place]) 31/28/1188  . DISORDER, DYSMETABOLIC SYNDROME X 67/73/7366  . SPRAIN/STRAIN, LUMBAR REGION 09/15/2006  . HYPERTRIGLYCERIDEMIA 07/05/2006  . KNEE PAIN, LEFT, CHRONIC 06/02/2006  . OBESITY, NOS 04/14/2006    Mariah Milling, OTR/L 04/07/2018, 4:50 PM  [facility] [address] [place], [place], [address] Phone: [phone]   Fax:  [phone]  Name: Anne Daniels MRN: [id number] Date of Birth: October 08, 1975

## 2018-04-18 ENCOUNTER — Ambulatory Visit: Payer: 59 | Admitting: Occupational Therapy

## 2018-04-18 ENCOUNTER — Other Ambulatory Visit: Payer: Self-pay | Admitting: Family Medicine

## 2018-04-20 ENCOUNTER — Ambulatory Visit: Payer: 59 | Admitting: Occupational Therapy

## 2018-05-08 ENCOUNTER — Encounter: Payer: Self-pay | Admitting: Occupational Therapy

## 2018-05-08 NOTE — Therapy (Signed)
[facility] [address] [place], [place], [address] Phone: [phone]   Fax:  [phone]  Occupational Therapy Treatment  Patient Details  Name: Anne Daniels MRN: [id number] Date of Birth: [birth year] Referring Provider (OT): Dr Barbaraann Barthel   Encounter Date: 05/08/2018  Documentation encounter    Past Medical History:  Diagnosis Date  . Diabetes mellitus without complication ([place])   . Migraines   . Syncope and collapse     Past Surgical History:  Procedure Laterality Date  . KNEE SURGERY    . LEFT HEART CATHETERIZATION WITH CORONARY ANGIOGRAM N/A 04/05/2014   Procedure: LEFT HEART CATHETERIZATION WITH CORONARY ANGIOGRAM;  Surgeon: Blane Ohara, MD;  Location: [facility] CATH LAB;  Service: Cardiovascular;  Laterality: N/A;  . WISDOM TOOTH EXTRACTION      There were no vitals filed for this visit.                          OT Short Term Goals - 02/03/18 1547      OT SHORT TERM GOAL #1   Title  Patient will complete a home exercise program to address coordination in LUE DUE 01/26/18    Time  4    Period  Weeks    Status  Achieved      OT SHORT TERM GOAL #2   Title  Patient will complete a home exercise program designed to improve AROM in left shoulder, wrist and digits    Time  4    Period  Weeks    Status  Achieved      OT SHORT TERM GOAL #3   Title  Patient will utilize LUE to pick up lightweight objects (less than 2 lbs) from low reach position with min cueing.      Status  Achieved      OT SHORT TERM GOAL #4   Title  Patient will demonstrate awareness of positions which exagerate her symptoms of numbness    Time  4    Period  Weeks    Status  Achieved        OT Long Term Goals - 04/07/18 1544      OT LONG TERM GOAL #3   Title  Patient will button her pants using BUE technique    Status  Achieved      OT LONG TERM GOAL #4   Title  Patient will chop a fruit or  vegetable using BUE technique    Status  Achieved      OT LONG TERM GOAL #5   Title  Patient will return to needlepoint activity using BUE technique    Status  On-going              Patient will benefit from skilled therapeutic intervention in order to improve the following deficits and impairments:     Visit Diagnosis: No diagnosis found.    Problem List Patient Active Problem List   Diagnosis Date Noted  . UTI symptoms 08/25/2017  . Dysmenorrhea 02/25/2017  . Non-intractable vomiting with nausea 09/24/2016  . Screening for cervical cancer 02/27/2016  . Neuropathy, diabetic ([place]) 05/21/2014  . Abnormal nuclear stress test   . Abnormal uterine bleeding 12/14/2011  . Obstructive sleep apnea 04/16/2011  . HOT FLASHES 10/17/2009  . ROTATOR CUFF INJURY, LEFT SHOULDER 02/27/2009  . CERVICAL RADICULOPATHY, LEFT 02/21/2009  . Depression, unipolar ([place]) 09/03/2008  . TRANSAMINASES, SERUM, ELEVATED 06/26/2008  . POSTURAL HYPOTENSION 04/02/2008  .  Diabetes mellitus without complication ([place]) 40/69/8614  . DISORDER, DYSMETABOLIC SYNDROME X 83/08/3541  . SPRAIN/STRAIN, LUMBAR REGION 09/15/2006  . HYPERTRIGLYCERIDEMIA 07/05/2006  . KNEE PAIN, LEFT, CHRONIC 06/02/2006  . OBESITY, NOS 04/14/2006  OCCUPATIONAL THERAPY DISCHARGE SUMMARY  Visits from Start of Care: 13  Current functional level related to goals / functional outcomes: Significant improvement in functional use of hands.      Remaining deficits: Pain in base of spine of scapula   Education / Equipment: Home Exercise Program for stretching and strengthening UE's, Work station modifications  Patient did not return for final scheduled appointments due to medical issues that superceded remaining therapy goals.   Patient unable to return currently due to restrictions with COVID19.   Plan:                                                    Patient goals were met. Patient is being discharged due to meeting the  stated rehab goals.  ?????         Mariah Milling , OTR/L 05/08/2018, 2:55 PM  [facility] [address] [place], [place], [address] Phone: [phone]   Fax:  [phone]  Name: CAMARIA GERALD MRN: [id number] Date of Birth: [birth year]

## 2018-05-09 ENCOUNTER — Other Ambulatory Visit: Payer: Self-pay

## 2018-05-09 ENCOUNTER — Telehealth (INDEPENDENT_AMBULATORY_CARE_PROVIDER_SITE_OTHER): Payer: 59 | Admitting: Family Medicine

## 2018-05-09 DIAGNOSIS — R112 Nausea with vomiting, unspecified: Secondary | ICD-10-CM

## 2018-05-09 DIAGNOSIS — R197 Diarrhea, unspecified: Secondary | ICD-10-CM | POA: Diagnosis not present

## 2018-05-09 MED ORDER — BISMUTH SUBSALICYLATE 262 MG/15ML PO SUSP: 30.0000 mL | Freq: Four times a day (QID) | ORAL | 0 refills | Status: DC | PRN

## 2018-05-09 MED ORDER — ONDANSETRON HCL 4 MG PO TABS: 4.0000 mg | ORAL_TABLET | Freq: Three times a day (TID) | ORAL | 0 refills | Status: DC | PRN

## 2018-05-09 NOTE — Progress Notes (Signed)
[facility] Telemedicine Visit  Patient consented to have visit conducted via telephone.  Encounter participants: Patient: Anne Daniels  Provider: Bon[place]ita [place]  Others (if applicable):   Chief Complaint: N/V/D  HPI:  Diarrhea: Patient complains of diarrhea. Onset of diarrhea was yesterday. Diarrhea is occurring approximately 4 times per day. Patient describes diarrhea as watery. Diarrhea has been associated with vomiting occurring 3 times.  Patient denies blood in stool, fever, illness in household contacts, recent antibiotic use, recent camping, significant abdominal pain, unintentional weight loss.  Previous visits for diarrhea: none. Evaluation to date: none. Treatment to date: oral rehydration.   ROS: As per HPI, with additional Vomiting in NBNB. No hematochezia or melina. Remainder of ROS negative.   Pertinent PMHx: Noncontributory  Assessment/Plan:  Problem List Items Addressed This Visit      Digestive   Non-intractable vomiting    Mild case of self limiting N/V/D. No fevers, chills, recent contacts. Likely viral gastro vs. Food poisoning. Will treat symptomatically. Strict return precautions given.       Relevant Medications   ondansetron (ZOFRAN) 4 MG tablet     Other   Diarrhea - Primary   Relevant Medications   bismuth subsalicylate (PEPTO-BISMOL) 262 MG/15ML suspension        Time spent on phone with patient: 15 minutes

## 2018-05-09 NOTE — Assessment & Plan Note (Signed)
Mild case of self limiting N/V/D. No fevers, chills, recent contacts. Likely viral gastro vs. Food poisoning. Will treat symptomatically. Strict return precautions given.

## 2018-05-10 ENCOUNTER — Other Ambulatory Visit: Payer: Self-pay | Admitting: Family Medicine

## 2018-05-12 ENCOUNTER — Other Ambulatory Visit: Payer: Self-pay

## 2018-05-12 ENCOUNTER — Telehealth: Payer: Self-pay | Admitting: Family Medicine

## 2018-05-12 ENCOUNTER — Ambulatory Visit (INDEPENDENT_AMBULATORY_CARE_PROVIDER_SITE_OTHER): Payer: 59 | Admitting: Family Medicine

## 2018-05-12 VITALS — BP 120/70 | HR 98 | Temp 99.1°F | Wt 268.0 lb

## 2018-05-12 DIAGNOSIS — A084 Viral intestinal infection, unspecified: Secondary | ICD-10-CM | POA: Diagnosis not present

## 2018-05-12 NOTE — Progress Notes (Signed)
    Subjective:  Anne Daniels is a 43 y.o. female who presents to the [facility] today with a chief complaint of abdominal pain.   HPI:  Patient had sudden onset of nausea vomiting and diarrhea over the last 3 days.  She has not had any fevers.  She is drinking well but not eating as much.  She has had greater than 10 episodes of diarrhea a day.  She feels like she is finally starting to improve today.  She has had no sick contacts, rashes, weight loss.  She is most concerned that she started Megace 1 month ago for abnormal uterine bleeding and wants to know if she should stop it.  Her hysterectomy was postponed to May.  ROS: Per HPI   Objective:  Physical Exam: BP 120/70   Pulse 98   Temp 99.1 F (37.3 C) (Oral)   Wt 268 lb (121.6 kg)   SpO2 98%   BMI 41.97 kg/m   Gen: NAD, resting comfortably CV: RRR with no murmurs appreciated Pulm: NWOB, CTAB with no crackles, wheezes, or rhonchi GI: Normal bowel sounds present. Soft, Nontender, Nondistended. Skin: warm, dry Neuro: grossly normal, moves all extremities Psych: Normal affect and thought content   Assessment/Plan:  1. Viral gastroenteritis Reassured patient.  She is afebrile and well-appearing.  She appears to be well-hydrated.  Discussed that while Megace can cause nausea/vomiting that unlikely to be source of patient's symptoms as she has been on this for the last 1 month.  Given her abrupt onset of nausea, vomiting, diarrhea more likely a viral gastroenteritis that is starting to felt improved.  Advised symptomatic management at home with good p.o. hydration and continued use of prn Zofran.   Bufford Lope, DO PGY-3, [facility] 05/12/2018 11:02 AM

## 2018-05-12 NOTE — Telephone Encounter (Signed)
Patient still has no improvement in ongoing abdominal pain, N/V/D. Pt called 2 days ago w/ similar symptoms. She was treated symptomatically with zofran and Pepto bismol. This helped with the N/V/D, but patient is concerned about abdominal pressure that she feels. She would like to be seen. She wants to be seen in the AM. Will schedule for ATC and FYI ATC provider and PCP.

## 2018-05-12 NOTE — Patient Instructions (Signed)
Stay well hydrated. Half strength apple juice.     Viral Gastroenteritis, Adult  Viral gastroenteritis is also known as the stomach flu. This condition is caused by certain germs (viruses). These germs can be passed from person to person very easily (are very contagious). This condition can cause sudden watery poop (diarrhea), fever, and throwing up (vomiting). Having watery poop and throwing up can make you feel weak and cause you to get dehydrated. Dehydration can make you tired and thirsty, make you have a dry mouth, and make it so you pee (urinate) less often. Older adults and people with other diseases or a weak defense system (immune system) are at higher risk for dehydration. It is important to replace the fluids that you lose from having watery poop and throwing up. Follow these instructions at home: Follow instructions from your doctor about how to care for yourself at home. Eating and drinking Follow these instructions as told by your doctor:  Take an oral rehydration solution (ORS). This is a drink that is sold at pharmacies and stores.  Drink clear fluids in small amounts as you are able, such as: ? Water. ? Ice chips. ? Diluted fruit juice. ? Low-calorie sports drinks.  Eat bland, easy-to-digest foods in small amounts as you are able, such as: ? Bananas. ? Applesauce. ? Rice. ? Low-fat (lean) meats. ? Toast. ? Crackers.  Avoid fluids that have a lot of sugar or caffeine in them.  Avoid alcohol.  Avoid spicy or fatty foods. General instructions   Drink enough fluid to keep your pee (urine) clear or pale yellow.  Wash your hands often. If you cannot use soap and water, use hand sanitizer.  Make sure that all people in your home wash their hands well and often.  Rest at home while you get better.  Take over-the-counter and prescription medicines only as told by your doctor.  Watch your condition for any changes.  Take a warm bath to help with any burning or  pain from having watery poop.  Keep all follow-up visits as told by your doctor. This is important. Contact a doctor if:  You cannot keep fluids down.  Your symptoms get worse.  You have new symptoms.  You feel light-headed or dizzy.  You have muscle cramps. Get help right away if:  You have chest pain.  You feel very weak or you pass out (faint).  You see blood in your throw-up.  Your throw-up looks like coffee grounds.  You have bloody or black poop (stools) or poop that look like tar.  You have a very bad headache, a stiff neck, or both.  You have a rash.  You have very bad pain, cramping, or bloating in your belly (abdomen).  You have trouble breathing.  You are breathing very quickly.  Your heart is beating very quickly.  Your skin feels cold and clammy.  You feel confused.  You have pain when you pee.  You have signs of dehydration, such as: ? Dark pee, hardly any pee, or no pee. ? Cracked lips. ? Dry mouth. ? Sunken eyes. ? Sleepiness. ? Weakness. This information is not intended to replace advice given to you by your health care provider. Make sure you discuss any questions you have with your health care provider. Document Released: 07/21/2007 Document Revised: 10/26/2017 Document Reviewed: 10/08/2014 Elsevier Interactive Patient Education  2019 [employer].

## 2018-05-18 ENCOUNTER — Telehealth: Payer: Self-pay | Admitting: Family Medicine

## 2018-05-18 NOTE — Telephone Encounter (Signed)
[facility] Telephone Triage Note  Patient called requesting work note.  She has hysterectomy planned due to very heavy vaginal bleeding. Has seen her OB/GYN Dr. Garwin Brothers for this and had a large workup for it. Was supposed to have hysterectomy today 4/2 but it was pushed back to 5/7 due to the [place] pandemic. In the interim she is being treated with megace to control bleeding.  She was seen here last week with abdominal pain, cramping, severe migraines and was diagnosed with a likely gastrointestinal virus. Patient is calling because she is still having these symptoms and she thinks it is actually a side effect of megace or related to whatever underlying uterine pathology she has. Her current symptoms are discomfort in her LLQ, discomfort with bowel movements due to pressure, and cramping. Also is having headaches which she relates to the megace.  Has been trying to get in touch with her OB/GYN for a week for guidance but has not been able to reach her, keeps leaving message with RN team at [facility] office with no response.  Patient states the way we can help is to provide a work note since she can't reach her OB. Her current symptoms are making it challenging for her to look at computer screens and sit for any period of time. Needs note saying she will intermittently need to leave work. She works for [employer] and [employer].  Will write work note for patient to receive in [place]. Sent text mychart activation to patient. Encouraged patient to keep contacting OB/GYn office for further guidance & management of her symptoms.   Chrisandra Netters, MD [place]

## 2018-06-22 ENCOUNTER — Ambulatory Visit (HOSPITAL_COMMUNITY): Admit: 2018-06-22 | Payer: 59 | Admitting: Obstetrics and Gynecology

## 2018-06-22 ENCOUNTER — Encounter (HOSPITAL_COMMUNITY): Payer: Self-pay

## 2018-06-22 SURGERY — XI ROBOTIC ASSISTED LAPAROSCOPIC HYSTERECTOMY AND SALPINGECTOMY
Anesthesia: General | Laterality: Bilateral

## 2018-06-28 ENCOUNTER — Other Ambulatory Visit: Payer: Self-pay | Admitting: Obstetrics and Gynecology

## 2018-06-29 ENCOUNTER — Encounter (HOSPITAL_COMMUNITY): Payer: Self-pay

## 2018-06-29 NOTE — Progress Notes (Signed)
SPOKE W/  [place]     SCREENING SYMPTOMS OF COVID 19:   COUGH--NO  RUNNY NOSE--- NO  SORE THROAT---NO  NASAL CONGESTION----NO  SNEEZING----NO  SHORTNESS OF BREATH---NO  DIFFICULTY BREATHING---NO  TEMP >100.0 -----NO  UNEXPLAINED BODY ACHES------NO  CHILLS -------- NO  HEADACHES ---------NO  LOSS OF SMELL/ TASTE --------NO    HAVE YOU OR ANY FAMILY MEMBER TRAVELLED PAST 14 DAYS OUT OF THE   COUNTY---NO STATE----NO COUNTRY----NO  HAVE YOU OR ANY FAMILY MEMBER BEEN EXPOSED TO ANYONE WITH COVID 19? NO

## 2018-06-29 NOTE — Patient Instructions (Signed)
DUE TO COVID-19 NO VISITORS ARE ALLOWED IN THE HOSPITAL AT THIS TIME   COVID SWAB TESTING MUST BE COMPLETED ON:  Monday, Jul 10, 2018   Your procedure is scheduled on: Thursday, Jul 13, 2018   Report to [place] AT  11:00 A. M.     Call this number if you have problems the morning of [phone].     OUR ADDRESS IS [address].  WE ARE LOCATED IN THE [place]                                   [facility].                                      REMEMBER:   DO NOT EAT FOOD AFTER MIDNIGHT .      MAY HAVE LIQUIDS UNTIL 7:00AM DAY OF SURGERY   CLEAR LIQUID DIET  Foods Allowed                                                                     Foods Excluded  Water, Black Coffee and tea, regular and decaf                             liquids that you cannot  Plain Jell-O in any flavor                                             see through such as: Fruit ices (not with fruit pulp)                                     milk, soups, orange juice  Iced Popsicles                                    All solid food Carbonated beverages, regular and diet                                    Cranberry, grape and apple juices Sports drinks like Gatorade Lightly seasoned clear broth or consume(fat free) Sugar, honey syrup  Sample Menu Breakfast                                Lunch                                     Supper Cranberry juice                    Beef broth  Chicken broth Jell-O                                     Grape juice                           Apple juice Coffee or tea                        Jell-O                                      Popsicle                                                Coffee or tea                        Coffee or tea   BRUSH YOUR TEETH THE MORNING OF SURGERY.   TAKE THESE MEDICATIONS MORNING OF SURGERY WITH A SIP OF WATER:  GABAPENTIN   DO NOT WEAR JEWERLY, MAKE UP, OR NAIL POLISH.   DO  NOT WEAR LOTIONS, POWDERS, PERFUMES/COLOGNE OR DEODORANT.   DO NOT SHAVE FOR 24 HOURS PRIOR TO DAY OF SURGERY.   CONTACTS, GLASSES, OR DENTURES MAY NOT BE WORN TO SURGERY.                                     [facility] IS NOT RESPONSIBLE  FOR ANY BELONGINGS.                                                                    Marland Kitchen   [place] - Preparing for Surgery Before surgery, you can play an important role.  Because skin is not sterile, your skin needs to be as free of germs as possible.  You can reduce the number of germs on your skin by washing with CHG (chlorahexidine gluconate) soap before surgery.  CHG is an antiseptic cleaner which kills germs and bonds with the skin to continue killing germs even after washing. Please DO NOT use if you have an allergy to CHG or antibacterial soaps.  If your skin becomes reddened/irritated stop using the CHG and inform your nurse when you arrive at Short Stay. Do not shave (including legs and underarms) for at least 48 hours prior to the first CHG shower.  You may shave your face/neck.  Please follow these instructions carefully:  1.  Shower with CHG Soap the night before surgery and the  morning of surgery.  2.  If you choose to wash your hair, wash your hair first as usual with your normal  shampoo.  3.  After you shampoo, rinse your hair and body thoroughly to remove the shampoo.  4.  Use CHG as you would any other liquid soap.  You can apply chg directly to the skin and wash.  Gently with a scrungie or clean washcloth.  5.  Apply the CHG Soap to your body ONLY FROM THE NECK DOWN.   Do   not use on face/ open                           Wound or open sores. Avoid contact with eyes, ears mouth and   genitals (private parts).                       Wash face,  Genitals (private parts) with your normal soap.             6.  Wash thoroughly, paying special attention to the area where your    surgery  will be performed.  7.   Thoroughly rinse your body with warm water from the neck down.  8.  DO NOT shower/wash with your normal soap after using and rinsing off the CHG Soap.                9.  Pat yourself dry with a clean towel.            10.  Wear clean pajamas.            11.  Place clean sheets on your bed the night of your first shower and do not  sleep with pets. Day of Surgery : Do not apply any lotions/deodorants the morning of surgery.  Please wear clean clothes to the hospital/surgery center.  FAILURE TO FOLLOW THESE INSTRUCTIONS MAY RESULT IN THE CANCELLATION OF YOUR SURGERY  PATIENT SIGNATURE_________________________________  NURSE SIGNATURE__________________________________  ________________________________________________________________________   Adam Phenix  An incentive spirometer is a tool that can help keep your lungs clear and active. This tool measures how well you are filling your lungs with each breath. Taking long deep breaths may help reverse or decrease the chance of developing breathing (pulmonary) problems (especially infection) following:  A long period of time when you are unable to move or be active. BEFORE THE PROCEDURE   If the spirometer includes an indicator to show your best effort, your nurse or respiratory therapist will set it to a desired goal.  If possible, sit up straight or lean slightly forward. Try not to slouch.  Hold the incentive spirometer in an upright position. INSTRUCTIONS FOR USE  1. Sit on the edge of your bed if possible, or sit up as far as you can in bed or on a chair. 2. Hold the incentive spirometer in an upright position. 3. Breathe out normally. 4. Place the mouthpiece in your mouth and seal your lips tightly around it. 5. Breathe in slowly and as deeply as possible, raising the piston or the ball toward the top of the column. 6. Hold your breath for 3-5 seconds or for as long as possible. Allow the piston or ball to fall to the  bottom of the column. 7. Remove the mouthpiece from your mouth and breathe out normally. 8. Rest for a few seconds and repeat Steps 1 through 7 at least 10 times every 1-2 hours when you are awake. Take your time and take a few normal breaths between deep breaths. 9. The spirometer may include an indicator to show your best effort. Use the indicator as a goal to work toward during each  repetition. 10. After each set of 10 deep breaths, practice coughing to be sure your lungs are clear. If you have an incision (the cut made at the time of surgery), support your incision when coughing by placing a pillow or rolled up towels firmly against it. Once you are able to get out of bed, walk around indoors and cough well. You may stop using the incentive spirometer when instructed by your caregiver.  RISKS AND COMPLICATIONS  Take your time so you do not get dizzy or light-headed.  If you are in pain, you may need to take or ask for pain medication before doing incentive spirometry. It is harder to take a deep breath if you are having pain. AFTER USE  Rest and breathe slowly and easily.  It can be helpful to keep track of a log of your progress. Your caregiver can provide you with a simple table to help with this. If you are using the spirometer at home, follow these instructions: [place] IF:   You are having difficultly using the spirometer.  You have trouble using the spirometer as often as instructed.  Your pain medication is not giving enough relief while using the spirometer.  You develop fever of 100.5 F (38.1 C) or higher. SEEK IMMEDIATE MEDICAL CARE IF:   You cough up bloody sputum that had not been present before.  You develop fever of 102 F (38.9 C) or greater.  You develop worsening pain at or near the incision site. MAKE SURE YOU:   Understand these instructions.  Will watch your condition.  Will get help right away if you are not doing well or get  worse. Document Released: 06/14/2006 Document Revised: 04/26/2011 Document Reviewed: 08/15/2006 [facility] Patient Information 2014 [place], [place].   ________________________________________________________________________

## 2018-06-30 ENCOUNTER — Other Ambulatory Visit: Payer: Self-pay

## 2018-06-30 ENCOUNTER — Encounter (HOSPITAL_COMMUNITY): Payer: Self-pay

## 2018-06-30 ENCOUNTER — Encounter (HOSPITAL_COMMUNITY)
Admission: RE | Admit: 2018-06-30 | Discharge: 2018-06-30 | Disposition: A | Discharge status: Home / Self Care | Payer: 59 | Source: Ambulatory Visit | Attending: Obstetrics and Gynecology | Admitting: Obstetrics and Gynecology

## 2018-06-30 DIAGNOSIS — N939 Abnormal uterine and vaginal bleeding, unspecified: Secondary | ICD-10-CM | POA: Diagnosis not present

## 2018-06-30 DIAGNOSIS — D259 Leiomyoma of uterus, unspecified: Secondary | ICD-10-CM | POA: Insufficient documentation

## 2018-06-30 DIAGNOSIS — Z01818 Encounter for other preprocedural examination: Secondary | ICD-10-CM | POA: Insufficient documentation

## 2018-06-30 HISTORY — DX: Dysmenorrhea, unspecified: N94.6

## 2018-06-30 HISTORY — DX: Gastro-esophageal reflux disease without esophagitis: K21.9

## 2018-06-30 HISTORY — DX: Anesthesia of skin: R20.0

## 2018-06-30 HISTORY — DX: Other specified postprocedural states: Z98.890

## 2018-06-30 HISTORY — DX: Personal history of adult physical and sexual abuse: Z91.410

## 2018-06-30 HISTORY — DX: Personal history of other diseases of the musculoskeletal system and connective tissue: Z87.39

## 2018-06-30 HISTORY — DX: Pneumonia, unspecified organism: J18.9

## 2018-06-30 HISTORY — DX: Unspecified osteoarthritis, unspecified site: M19.90

## 2018-06-30 HISTORY — DX: Nausea with vomiting, unspecified: R11.2

## 2018-06-30 HISTORY — DX: Other specified postprocedural states: R11.2

## 2018-06-30 LAB — CBC
HCT: 43.6 % (ref 36.0–46.0)
Hemoglobin: 14.5 g/dL (ref 12.0–15.0)
MCH: 31.4 pg (ref 26.0–34.0)
MCHC: 33.3 g/dL (ref 30.0–36.0)
MCV: 94.4 fL (ref 80.0–100.0)
Platelets: 193 10*3/uL (ref 150–400)
RBC: 4.62 MIL/uL (ref 3.87–5.11)
RDW: 12.9 % (ref 11.5–15.5)
WBC: 6.2 10*3/uL (ref 4.0–10.5)
nRBC: 0 % (ref 0.0–0.2)

## 2018-06-30 LAB — BASIC METABOLIC PANEL
Anion gap: 6 (ref 5–15)
BUN: 11 mg/dL (ref 6–20)
CO2: 23 mmol/L (ref 22–32)
Calcium: 8.8 mg/dL — ABNORMAL LOW (ref 8.9–10.3)
Chloride: 109 mmol/L (ref 98–111)
Creatinine, Ser: 0.6 mg/dL (ref 0.44–1.00)
GFR calc Af Amer: >60 mL/min (ref >60)
GFR calc non Af Amer: >60 mL/min (ref >60)
Glucose, Bld: 179 mg/dL — ABNORMAL HIGH (ref 70–99)
Potassium: 3.7 mmol/L (ref 3.5–5.1)
Sodium: 138 mmol/L (ref 135–145)

## 2018-06-30 LAB — HEMOGLOBIN A1C
Hgb A1c MFr Bld: 7.7 % — ABNORMAL HIGH (ref 4.8–5.6)
Mean Plasma Glucose: 174.29 mg/dL

## 2018-06-30 LAB — GLUCOSE, CAPILLARY: Glucose-Capillary: 165 mg/dL — ABNORMAL HIGH (ref 70–99)

## 2018-07-04 NOTE — Anesthesia Preprocedure Evaluation (Addendum)
Anesthesia Evaluation    History of Anesthesia Complications (+) PONV and history of anesthetic complications  Airway Mallampati: II  TM Distance: >3 FB Neck ROM: Full    Dental no notable dental hx.    Pulmonary sleep apnea ,    Pulmonary exam normal breath sounds clear to auscultation       Cardiovascular Normal cardiovascular exam Rhythm:Regular Rate:Normal  EKG: 06/30/2018 Rate 68 bpm Normal sinus rhythm  Minimal voltage criteria for LVH, may be normal variant  CV: Cardiac Cath 04/05/14 Final Conclusions:  1. Widely patent coronary arteries with minimal luminal irregularities 2. Normal LV systolic function  Echo 76/73/41 Left ventricle: The cavity size was normal. There was mild focal basal hypertrophy of the septum. Systolic function was normal. The estimated ejection fraction was in the range of 55% to 60%. Wall motion was normal; there were no regional wall motion abnormalities. - Mitral valve: There was trivial regurgitation. - Right ventricle: The cavity size was moderately dilated. Wall thickness was normal. - Tricuspid valve: There was mild regurgitation.   Neuro/Psych  Headaches, PSYCHIATRIC DISORDERS Depression    GI/Hepatic GERD  Medicated,  Endo/Other  diabetes  Renal/GU      Musculoskeletal  (+) Arthritis , Osteoarthritis,    Abdominal   Peds  Hematology   Anesthesia Other Findings   Reproductive/Obstetrics                           Anesthesia Physical Anesthesia Plan  ASA: III  Anesthesia Plan: General   Post-op Pain Management:    Induction: Intravenous  PONV Risk Score and Plan: 2 and 3 and Ondansetron, Dexamethasone, Treatment may vary due to age or medical condition, Scopolamine patch - Pre-op and Midazolam  Airway Management Planned: Oral ETT  Additional Equipment:   Intra-op Plan:   Post-operative Plan: Extubation in OR  Informed  Consent:   Plan Discussed with: CRNA, Anesthesiologist and Surgeon  Anesthesia Plan Comments: (See PAT note 06/30/18, Konrad Felix, PA-C)       Anesthesia Quick Evaluation
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
## 2018-07-05 ENCOUNTER — Other Ambulatory Visit: Payer: Self-pay

## 2018-07-05 MED ORDER — METFORMIN HCL 1000 MG PO TABS: 1000.0000 mg | ORAL_TABLET | Freq: Two times a day (BID) | ORAL | 11 refills | Status: DC

## 2018-07-07 ENCOUNTER — Other Ambulatory Visit: Payer: Self-pay

## 2018-07-07 ENCOUNTER — Other Ambulatory Visit (HOSPITAL_COMMUNITY)
Admission: RE | Admit: 2018-07-07 | Discharge: 2018-07-07 | Disposition: A | Discharge status: Home / Self Care | Payer: 59 | Source: Ambulatory Visit | Attending: Obstetrics and Gynecology | Admitting: Obstetrics and Gynecology

## 2018-07-07 DIAGNOSIS — Z1159 Encounter for screening for other viral diseases: Secondary | ICD-10-CM | POA: Insufficient documentation

## 2018-07-08 LAB — NOVEL CORONAVIRUS, NAA (HOSP ORDER, SEND-OUT TO REF LAB; TAT 18-24 HRS): SARS-CoV-2, NAA: NOT DETECTED

## 2018-07-12 NOTE — Progress Notes (Signed)
SPOKE W/  [facility]     SCREENING SYMPTOMS OF COVID 19:   COUGH--no  RUNNY NOSE--no-   SORE THROAT-no--  NASAL CONGESTION--no--  SNEEZING----no  SHORTNESS OF BREATH--no-  DIFFICULTY BREATHING--no-  TEMP >100.0 ----no-  UNEXPLAINED BODY ACHES----no--  CHILLS ----no----   HEADACHES -----no----  LOSS OF SMELL/ TASTE -----no---    HAVE YOU OR ANY FAMILY MEMBER TRAVELLED PAST 14 DAYS OUT OF THE   COUNTY-no-- STATE--no-- COUNTRY--no--  HAVE YOU OR ANY FAMILY MEMBER BEEN EXPOSED TO ANYONE WITH COVID 19? no

## 2018-07-13 ENCOUNTER — Ambulatory Visit (HOSPITAL_BASED_OUTPATIENT_CLINIC_OR_DEPARTMENT_OTHER)
Admission: RE | Admit: 2018-07-13 | Discharge: 2018-07-14 | Disposition: A | Discharge status: Home / Self Care | Payer: 59 | Attending: Obstetrics and Gynecology | Admitting: Obstetrics and Gynecology

## 2018-07-13 ENCOUNTER — Other Ambulatory Visit: Payer: Self-pay

## 2018-07-13 ENCOUNTER — Encounter (HOSPITAL_BASED_OUTPATIENT_CLINIC_OR_DEPARTMENT_OTHER): Payer: Self-pay

## 2018-07-13 ENCOUNTER — Encounter (HOSPITAL_BASED_OUTPATIENT_CLINIC_OR_DEPARTMENT_OTHER)
Admission: RE | Disposition: A | Discharge status: Home / Self Care | Payer: Self-pay | Source: Home / Self Care | Attending: Obstetrics and Gynecology

## 2018-07-13 ENCOUNTER — Ambulatory Visit (HOSPITAL_BASED_OUTPATIENT_CLINIC_OR_DEPARTMENT_OTHER): Payer: 59 | Admitting: Physician Assistant

## 2018-07-13 ENCOUNTER — Ambulatory Visit (HOSPITAL_BASED_OUTPATIENT_CLINIC_OR_DEPARTMENT_OTHER): Payer: 59 | Admitting: Anesthesiology

## 2018-07-13 DIAGNOSIS — N939 Abnormal uterine and vaginal bleeding, unspecified: Secondary | ICD-10-CM | POA: Insufficient documentation

## 2018-07-13 DIAGNOSIS — K219 Gastro-esophageal reflux disease without esophagitis: Secondary | ICD-10-CM | POA: Diagnosis not present

## 2018-07-13 DIAGNOSIS — I10 Essential (primary) hypertension: Secondary | ICD-10-CM | POA: Diagnosis not present

## 2018-07-13 DIAGNOSIS — G4730 Sleep apnea, unspecified: Secondary | ICD-10-CM | POA: Insufficient documentation

## 2018-07-13 DIAGNOSIS — Z90710 Acquired absence of both cervix and uterus: Secondary | ICD-10-CM | POA: Diagnosis present

## 2018-07-13 DIAGNOSIS — N800 Endometriosis of uterus: Secondary | ICD-10-CM | POA: Insufficient documentation

## 2018-07-13 DIAGNOSIS — N938 Other specified abnormal uterine and vaginal bleeding: Secondary | ICD-10-CM | POA: Diagnosis present

## 2018-07-13 DIAGNOSIS — N888 Other specified noninflammatory disorders of cervix uteri: Secondary | ICD-10-CM | POA: Insufficient documentation

## 2018-07-13 DIAGNOSIS — Z6841 Body Mass Index (BMI) 40.0 and over, adult: Secondary | ICD-10-CM | POA: Diagnosis not present

## 2018-07-13 DIAGNOSIS — E119 Type 2 diabetes mellitus without complications: Secondary | ICD-10-CM | POA: Insufficient documentation

## 2018-07-13 DIAGNOSIS — N838 Other noninflammatory disorders of ovary, fallopian tube and broad ligament: Secondary | ICD-10-CM | POA: Diagnosis not present

## 2018-07-13 HISTORY — PX: ROBOTIC ASSISTED LAPAROSCOPIC HYSTERECTOMY AND SALPINGECTOMY: SHX6379

## 2018-07-13 LAB — GLUCOSE, CAPILLARY
Glucose-Capillary: 117 mg/dL — ABNORMAL HIGH (ref 70–99)
Glucose-Capillary: 172 mg/dL — ABNORMAL HIGH (ref 70–99)
Glucose-Capillary: 165 mg/dL — ABNORMAL HIGH (ref 70–99)

## 2018-07-13 LAB — CBC
HCT: 42.5 % (ref 36.0–46.0)
Hemoglobin: 14 g/dL (ref 12.0–15.0)
MCH: 32 pg (ref 26.0–34.0)
MCHC: 32.9 g/dL (ref 30.0–36.0)
MCV: 97.3 fL (ref 80.0–100.0)
Platelets: 155 10*3/uL (ref 150–400)
RBC: 4.37 MIL/uL (ref 3.87–5.11)
RDW: 12.4 % (ref 11.5–15.5)
WBC: 9.7 10*3/uL (ref 4.0–10.5)
nRBC: 0 % (ref 0.0–0.2)

## 2018-07-13 LAB — POCT PREGNANCY, URINE: Preg Test, Ur: NEGATIVE

## 2018-07-13 SURGERY — XI ROBOTIC ASSISTED LAPAROSCOPIC HYSTERECTOMY AND SALPINGECTOMY
Anesthesia: General | Laterality: Bilateral

## 2018-07-13 MED ORDER — FENTANYL CITRATE (PF) 100 MCG/2ML IJ SOLN
INTRAMUSCULAR | Status: DC | PRN
  Administered 2018-07-13 (×2): 50 ug via INTRAVENOUS
  Administered 2018-07-13 (×2): 25 ug via INTRAVENOUS

## 2018-07-13 MED ORDER — ONDANSETRON HCL 4 MG PO TABS
4.0000 mg | ORAL_TABLET | Freq: Four times a day (QID) | ORAL | Status: DC | PRN
  Filled 2018-07-13: qty 1

## 2018-07-13 MED ORDER — OXYCODONE HCL 5 MG PO TABS
5.0000 mg | ORAL_TABLET | ORAL | Status: DC | PRN
  Administered 2018-07-14: 5 mg via ORAL
  Administered 2018-07-14: 10 mg via ORAL
  Filled 2018-07-13: qty 2

## 2018-07-13 MED ORDER — MEPERIDINE HCL 25 MG/ML IJ SOLN
6.2500 mg | INTRAMUSCULAR | Status: DC | PRN
  Filled 2018-07-13: qty 1

## 2018-07-13 MED ORDER — ARTIFICIAL TEARS OPHTHALMIC OINT
TOPICAL_OINTMENT | OPHTHALMIC | Status: AC
  Filled 2018-07-13: qty 3.5

## 2018-07-13 MED ORDER — PROPOFOL 10 MG/ML IV BOLUS
INTRAVENOUS | Status: DC | PRN
  Administered 2018-07-13: 200 mg via INTRAVENOUS

## 2018-07-13 MED ORDER — SCOPOLAMINE 1 MG/3DAYS TD PT72
MEDICATED_PATCH | TRANSDERMAL | Status: AC
  Filled 2018-07-13: qty 1

## 2018-07-13 MED ORDER — HYDROMORPHONE HCL 1 MG/ML IJ SOLN
0.2500 mg | INTRAMUSCULAR | Status: AC | PRN
  Administered 2018-07-13: 0.25 mg via INTRAVENOUS
  Administered 2018-07-13: 0.5 mg via INTRAVENOUS
  Administered 2018-07-13 (×6): 0.25 mg via INTRAVENOUS
  Filled 2018-07-13: qty 0.5

## 2018-07-13 MED ORDER — GENTAMICIN SULFATE 40 MG/ML IJ SOLN
5.0000 mg/kg | INTRAVENOUS | Status: AC
  Administered 2018-07-13: 420 mg via INTRAVENOUS
  Filled 2018-07-13: qty 10.5

## 2018-07-13 MED ORDER — DEXAMETHASONE SODIUM PHOSPHATE 10 MG/ML IJ SOLN
INTRAMUSCULAR | Status: DC | PRN
  Administered 2018-07-13: 10 mg via INTRAVENOUS

## 2018-07-13 MED ORDER — LIDOCAINE 2% (20 MG/ML) 5 ML SYRINGE
INTRAMUSCULAR | Status: AC
  Filled 2018-07-13: qty 5

## 2018-07-13 MED ORDER — LIDOCAINE 2% (20 MG/ML) 5 ML SYRINGE
INTRAMUSCULAR | Status: DC | PRN
  Administered 2018-07-13: 80 mg via INTRAVENOUS

## 2018-07-13 MED ORDER — MIDAZOLAM HCL 2 MG/2ML IJ SOLN
0.5000 mg | Freq: Once | INTRAMUSCULAR | Status: DC | PRN
  Filled 2018-07-13: qty 2

## 2018-07-13 MED ORDER — MIDAZOLAM HCL 2 MG/2ML IJ SOLN
INTRAMUSCULAR | Status: DC | PRN
  Administered 2018-07-13: 2 mg via INTRAVENOUS

## 2018-07-13 MED ORDER — SCOPOLAMINE 1 MG/3DAYS TD PT72
1.0000 | MEDICATED_PATCH | TRANSDERMAL | Status: DC
  Administered 2018-07-13: 1.5 mg via TRANSDERMAL
  Filled 2018-07-13: qty 1

## 2018-07-13 MED ORDER — GENTAMICIN SULFATE 40 MG/ML IJ SOLN
5.0000 mg/kg | INTRAVENOUS | Status: DC
  Filled 2018-07-13: qty 15.25

## 2018-07-13 MED ORDER — ACETAMINOPHEN 500 MG PO TABS
1000.0000 mg | ORAL_TABLET | Freq: Four times a day (QID) | ORAL | Status: DC
  Administered 2018-07-14 (×2): 1000 mg via ORAL
  Filled 2018-07-13: qty 2

## 2018-07-13 MED ORDER — FENTANYL CITRATE (PF) 100 MCG/2ML IJ SOLN
INTRAMUSCULAR | Status: AC
  Filled 2018-07-13: qty 2

## 2018-07-13 MED ORDER — SUCCINYLCHOLINE CHLORIDE 200 MG/10ML IV SOSY
PREFILLED_SYRINGE | INTRAVENOUS | Status: DC | PRN
  Administered 2018-07-13: 160 mg via INTRAVENOUS

## 2018-07-13 MED ORDER — CLINDAMYCIN PHOSPHATE 900 MG/50ML IV SOLN
900.0000 mg | INTRAVENOUS | Status: AC
  Administered 2018-07-13: 900 mg via INTRAVENOUS
  Filled 2018-07-13: qty 50

## 2018-07-13 MED ORDER — PAROXETINE HCL ER 37.5 MG PO TB24
37.5000 mg | ORAL_TABLET | Freq: Every day | ORAL | Status: DC
  Filled 2018-07-13: qty 1

## 2018-07-13 MED ORDER — HYDROMORPHONE HCL 1 MG/ML IJ SOLN
INTRAMUSCULAR | Status: AC
  Filled 2018-07-13: qty 1

## 2018-07-13 MED ORDER — DEXAMETHASONE SODIUM PHOSPHATE 10 MG/ML IJ SOLN
INTRAMUSCULAR | Status: AC
  Filled 2018-07-13: qty 1

## 2018-07-13 MED ORDER — LACTATED RINGERS IV SOLN
INTRAVENOUS | Status: DC
  Administered 2018-07-13: 1000 mL via INTRAVENOUS
  Administered 2018-07-13: 15:00:00 via INTRAVENOUS
  Administered 2018-07-13: 50 mL via INTRAVENOUS
  Filled 2018-07-13: qty 1000

## 2018-07-13 MED ORDER — MENTHOL 3 MG MT LOZG
1.0000 | LOZENGE | OROMUCOSAL | Status: DC | PRN
  Filled 2018-07-13: qty 9

## 2018-07-13 MED ORDER — SODIUM CHLORIDE 0.9 % IR SOLN
Status: DC | PRN
  Administered 2018-07-13: 1000 mL

## 2018-07-13 MED ORDER — ONDANSETRON HCL 4 MG/2ML IJ SOLN
INTRAMUSCULAR | Status: AC
  Filled 2018-07-13: qty 2

## 2018-07-13 MED ORDER — PANTOPRAZOLE SODIUM 40 MG PO TBEC
DELAYED_RELEASE_TABLET | ORAL | Status: AC
  Filled 2018-07-13: qty 1

## 2018-07-13 MED ORDER — ALBUMIN HUMAN 5 % IV SOLN
12.5000 g | Freq: Once | INTRAVENOUS | Status: DC
  Filled 2018-07-13 (×2): qty 250

## 2018-07-13 MED ORDER — PANTOPRAZOLE SODIUM 40 MG PO TBEC
40.0000 mg | DELAYED_RELEASE_TABLET | Freq: Every day | ORAL | Status: DC
  Administered 2018-07-13: 40 mg via ORAL
  Filled 2018-07-13: qty 1

## 2018-07-13 MED ORDER — SIMETHICONE 80 MG PO CHEW
80.0000 mg | CHEWABLE_TABLET | Freq: Four times a day (QID) | ORAL | Status: DC | PRN
  Filled 2018-07-13: qty 1

## 2018-07-13 MED ORDER — ONDANSETRON HCL 4 MG/2ML IJ SOLN
4.0000 mg | Freq: Four times a day (QID) | INTRAMUSCULAR | Status: DC | PRN
  Filled 2018-07-13: qty 2

## 2018-07-13 MED ORDER — BUPIVACAINE HCL (PF) 0.25 % IJ SOLN
INTRAMUSCULAR | Status: DC | PRN
  Administered 2018-07-13: 15 mL

## 2018-07-13 MED ORDER — PROPOFOL 10 MG/ML IV BOLUS
INTRAVENOUS | Status: AC
  Filled 2018-07-13: qty 20

## 2018-07-13 MED ORDER — ROCURONIUM BROMIDE 10 MG/ML (PF) SYRINGE
PREFILLED_SYRINGE | INTRAVENOUS | Status: AC
  Filled 2018-07-13: qty 10

## 2018-07-13 MED ORDER — GLYCOPYRROLATE PF 0.2 MG/ML IJ SOSY
PREFILLED_SYRINGE | INTRAMUSCULAR | Status: AC
  Filled 2018-07-13: qty 1

## 2018-07-13 MED ORDER — ROCURONIUM BROMIDE 10 MG/ML (PF) SYRINGE
PREFILLED_SYRINGE | INTRAVENOUS | Status: DC | PRN
  Administered 2018-07-13: 10 mg via INTRAVENOUS
  Administered 2018-07-13: 20 mg via INTRAVENOUS
  Administered 2018-07-13: 50 mg via INTRAVENOUS

## 2018-07-13 MED ORDER — GABAPENTIN 100 MG PO CAPS
100.0000 mg | ORAL_CAPSULE | ORAL | Status: DC
  Administered 2018-07-14: 100 mg via ORAL
  Filled 2018-07-13 (×2): qty 1

## 2018-07-13 MED ORDER — SUCCINYLCHOLINE CHLORIDE 200 MG/10ML IV SOSY
PREFILLED_SYRINGE | INTRAVENOUS | Status: AC
  Filled 2018-07-13: qty 10

## 2018-07-13 MED ORDER — KETOROLAC TROMETHAMINE 30 MG/ML IJ SOLN
INTRAMUSCULAR | Status: AC
  Filled 2018-07-13: qty 1

## 2018-07-13 MED ORDER — PROMETHAZINE HCL 25 MG/ML IJ SOLN
6.2500 mg | INTRAMUSCULAR | Status: DC | PRN
  Filled 2018-07-13: qty 1

## 2018-07-13 MED ORDER — CLINDAMYCIN PHOSPHATE 900 MG/50ML IV SOLN
INTRAVENOUS | Status: AC
  Filled 2018-07-13: qty 50

## 2018-07-13 MED ORDER — GLYCOPYRROLATE PF 0.2 MG/ML IJ SOSY
PREFILLED_SYRINGE | INTRAMUSCULAR | Status: DC | PRN
  Administered 2018-07-13: .2 mg via INTRAVENOUS

## 2018-07-13 MED ORDER — LIDOCAINE 2% (20 MG/ML) 5 ML SYRINGE
INTRAMUSCULAR | Status: DC | PRN
  Administered 2018-07-13: 1.5 mg/kg/h via INTRAVENOUS

## 2018-07-13 MED ORDER — LOSARTAN POTASSIUM 25 MG PO TABS
25.0000 mg | ORAL_TABLET | Freq: Every day | ORAL | Status: DC
  Filled 2018-07-13: qty 1

## 2018-07-13 MED ORDER — METFORMIN HCL 500 MG PO TABS
1000.0000 mg | ORAL_TABLET | Freq: Two times a day (BID) | ORAL | Status: DC
  Administered 2018-07-14: 1000 mg via ORAL
  Filled 2018-07-13 (×2): qty 2

## 2018-07-13 MED ORDER — IBUPROFEN 800 MG PO TABS
800.0000 mg | ORAL_TABLET | Freq: Three times a day (TID) | ORAL | Status: DC
  Administered 2018-07-13 – 2018-07-14 (×2): 800 mg via ORAL
  Filled 2018-07-13 (×2): qty 1

## 2018-07-13 MED ORDER — DEXTROSE IN LACTATED RINGERS 5 % IV SOLN
INTRAVENOUS | Status: DC
  Administered 2018-07-13 – 2018-07-14 (×2): via INTRAVENOUS
  Filled 2018-07-13 (×2): qty 1000

## 2018-07-13 MED ORDER — HYDROMORPHONE HCL 1 MG/ML IJ SOLN
0.2500 mg | Freq: Once | INTRAMUSCULAR | Status: AC
  Administered 2018-07-13: 0.25 mg via INTRAVENOUS
  Filled 2018-07-13: qty 0.5

## 2018-07-13 MED ORDER — IBUPROFEN 200 MG PO TABS
ORAL_TABLET | ORAL | Status: AC
  Filled 2018-07-13: qty 4

## 2018-07-13 MED ORDER — MIDAZOLAM HCL 2 MG/2ML IJ SOLN
INTRAMUSCULAR | Status: AC
  Filled 2018-07-13: qty 2

## 2018-07-13 SURGICAL SUPPLY — 69 items
ADH SKN CLS APL DERMABOND .7 (GAUZE/BANDAGES/DRESSINGS) ×1
APL PRP STRL LF DISP 70% ISPRP (MISCELLANEOUS) ×2
APL SRG 38 LTWT LNG FL B (MISCELLANEOUS) ×1
APPLICATOR ARISTA FLEXITIP XL (MISCELLANEOUS) ×1 IMPLANT
BARRIER ADHS 3X4 INTERCEED (GAUZE/BANDAGES/DRESSINGS) IMPLANT
BRR ADH 4X3 ABS CNTRL BYND (GAUZE/BANDAGES/DRESSINGS)
CANISTER SUCT 3000ML PPV (MISCELLANEOUS) ×1 IMPLANT
CATH FOLEY 3WAY  5CC 16FR (CATHETERS) ×1
CATH FOLEY 3WAY 5CC 16FR (CATHETERS) ×1 IMPLANT
CHLORAPREP W/TINT 26 (MISCELLANEOUS) ×2 IMPLANT
COVER BACK TABLE 60X90IN (DRAPES) ×2 IMPLANT
COVER TIP SHEARS 8 DVNC (MISCELLANEOUS) ×1 IMPLANT
COVER TIP SHEARS 8MM DA VINCI (MISCELLANEOUS) ×1
DECANTER SPIKE VIAL GLASS SM (MISCELLANEOUS) ×2 IMPLANT
DEFOGGER SCOPE WARMER CLEARIFY (MISCELLANEOUS) ×2 IMPLANT
DERMABOND ADVANCED (GAUZE/BANDAGES/DRESSINGS) ×1
DERMABOND ADVANCED .7 DNX12 (GAUZE/BANDAGES/DRESSINGS) ×1 IMPLANT
DRAPE ARM DVNC X/XI (DISPOSABLE) ×4 IMPLANT
DRAPE COLUMN DVNC XI (DISPOSABLE) ×1 IMPLANT
DRAPE DA VINCI XI ARM (DISPOSABLE) ×4
DRAPE DA VINCI XI COLUMN (DISPOSABLE) ×1
DRSG COVADERM PLUS 2X2 (GAUZE/BANDAGES/DRESSINGS) ×2 IMPLANT
DRSG EMULSION OIL 3X3 NADH (GAUZE/BANDAGES/DRESSINGS) ×1 IMPLANT
DRSG TEGADERM 4X4.75 (GAUZE/BANDAGES/DRESSINGS) ×1 IMPLANT
DURAPREP 26ML APPLICATOR (WOUND CARE) ×1 IMPLANT
ELECT REM PT RETURN 9FT ADLT (ELECTROSURGICAL) ×2
ELECTRODE REM PT RTRN 9FT ADLT (ELECTROSURGICAL) ×1 IMPLANT
GAUZE 4X4 16PLY RFD (DISPOSABLE) ×1 IMPLANT
GLOVE BIO SURGEON STRL SZ 6.5 (GLOVE) ×1 IMPLANT
GLOVE BIO SURGEON STRL SZ7.5 (GLOVE) ×1 IMPLANT
GLOVE BIOGEL PI IND STRL 7.0 (GLOVE) ×5 IMPLANT
GLOVE BIOGEL PI INDICATOR 7.0 (GLOVE) ×8
GLOVE ECLIPSE 6.5 STRL STRAW (GLOVE) ×6 IMPLANT
HEMOSTAT ARISTA ABSORB 3G PWDR (HEMOSTASIS) ×1 IMPLANT
IRRIG SUCT STRYKERFLOW 2 WTIP (MISCELLANEOUS) ×2
IRRIGATION SUCT STRKRFLW 2 WTP (MISCELLANEOUS) ×1 IMPLANT
LEGGING LITHOTOMY PAIR STRL (DRAPES) ×2 IMPLANT
MANIFOLD NEPTUNE II (INSTRUMENTS) ×1 IMPLANT
NEEDLE INSUFFLATION 120MM (ENDOMECHANICALS) ×2 IMPLANT
OBTURATOR OPTICAL STANDARD 8MM (TROCAR) ×2
OBTURATOR OPTICAL STND 8 DVNC (TROCAR) ×2
OBTURATOR OPTICALSTD 8 DVNC (TROCAR) ×1 IMPLANT
OCCLUDER COLPOPNEUMO (BALLOONS) ×2 IMPLANT
PACK ROBOT WH (CUSTOM PROCEDURE TRAY) ×2 IMPLANT
PACK ROBOTIC GOWN (GOWN DISPOSABLE) ×2 IMPLANT
PACK TRENDGUARD 450 HYBRID PRO (MISCELLANEOUS) ×1 IMPLANT
PAD PREP 24X48 CUFFED NSTRL (MISCELLANEOUS) ×2 IMPLANT
PROTECTOR NERVE ULNAR (MISCELLANEOUS) ×4 IMPLANT
SEAL CANN UNIV 5-8 DVNC XI (MISCELLANEOUS) ×4 IMPLANT
SEAL XI 5MM-8MM UNIVERSAL (MISCELLANEOUS) ×4
SEALER VESSEL DA VINCI XI (MISCELLANEOUS) ×1
SEALER VESSEL EXT DVNC XI (MISCELLANEOUS) ×1 IMPLANT
SET CYSTO W/LG BORE CLAMP LF (SET/KITS/TRAYS/PACK) IMPLANT
SET TRI-LUMEN FLTR TB AIRSEAL (TUBING) ×2 IMPLANT
SUT PLAIN 2 0 XLH (SUTURE) ×1 IMPLANT
SUT VIC AB 0 CT1 36 (SUTURE) ×4 IMPLANT
SUT VIC AB 4-0 PS2 18 (SUTURE) ×5 IMPLANT
SUT VICRYL 4-0 PS2 18IN ABS (SUTURE) ×4 IMPLANT
SUT VLOC 180 0 9IN  GS21 (SUTURE) ×1
SUT VLOC 180 0 9IN GS21 (SUTURE) ×1 IMPLANT
TIP RUMI ORANGE 6.7MMX12CM (TIP) IMPLANT
TIP UTERINE 5.1X6CM LAV DISP (MISCELLANEOUS) IMPLANT
TIP UTERINE 6.7X10CM GRN DISP (MISCELLANEOUS) IMPLANT
TIP UTERINE 6.7X6CM WHT DISP (MISCELLANEOUS) IMPLANT
TIP UTERINE 6.7X8CM BLUE DISP (MISCELLANEOUS) ×1 IMPLANT
TOWEL OR 17X26 10 PK STRL BLUE (TOWEL DISPOSABLE) ×4 IMPLANT
TRENDGUARD 450 HYBRID PRO PACK (MISCELLANEOUS) ×2
TROCAR PORT AIRSEAL 8X120 (TROCAR) ×2 IMPLANT
WATER STERILE IRR 1000ML POUR (IV SOLUTION) ×2 IMPLANT

## 2018-07-13 NOTE — Transfer of Care (Signed)
Immediate Anesthesia Transfer of Care Note  Patient: Cathryne S Mitter  Procedure(s) Performed: Procedure(s) (LRB): XI ROBOTIC ASSISTED LAPAROSCOPIC HYSTERECTOMY AND SALPINGECTOMY (Bilateral)  Patient Location: PACU  Anesthesia Type: General  Level of Consciousness: awake, oriented, sedated and patient cooperative  Airway & Oxygen Therapy: Patient Spontanous Breathing and Patient connected to face mask oxygen  Post-op Assessment: Report given to PACU RN and Post -op Vital signs reviewed and stable  Post vital signs: Reviewed and stable  Complications: No apparent anesthesia complications  Last Vitals:  Vitals Value Taken Time  BP 107/58 07/13/2018  4:38 PM  Temp    Pulse 99 07/13/2018  4:40 PM  Resp 22 07/13/2018  4:40 PM  SpO2 92 % 07/13/2018  4:40 PM  Vitals shown include unvalidated device data.  Last Pain:  Vitals:   07/13/18 1129  TempSrc:   PainSc: 5       Patients Stated Pain Goal: 3 (07/13/18 1129)
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
## 2018-07-13 NOTE — Anesthesia Postprocedure Evaluation (Signed)
Anesthesia Post Note  Patient: Anne Daniels  Procedure(s) Performed: XI ROBOTIC ASSISTED LAPAROSCOPIC HYSTERECTOMY AND SALPINGECTOMY (Bilateral )     Patient location during evaluation: PACU Anesthesia Type: General Level of consciousness: awake and alert, patient cooperative and oriented Pain management: pain level controlled Vital Signs Assessment: post-procedure vital signs reviewed and stable Respiratory status: spontaneous breathing, nonlabored ventilation, respiratory function stable and patient connected to nasal cannula oxygen Cardiovascular status: blood pressure returned to baseline and stable Postop Assessment: no apparent nausea or vomiting Anesthetic complications: no    Last Vitals:  Vitals:   07/13/18 2020 07/13/18 2028  BP:    Pulse: 90 (!) 105  Resp: 15 13  Temp:    SpO2: 94% 93%    Last Pain:  Vitals:   07/13/18 2020  TempSrc:   PainSc: 5                  Rashawn Rayman,E. Eniya Cannady

## 2018-07-13 NOTE — Anesthesia Procedure Notes (Signed)
Procedure Name: Intubation Date/Time: 07/13/2018 1:14 PM Performed by: Suan Halter, CRNA Pre-anesthesia Checklist: Patient identified, Emergency Drugs available, Suction available and Patient being monitored Patient Re-evaluated:Patient Re-evaluated prior to induction Oxygen Delivery Method: Circle system utilized Preoxygenation: Pre-oxygenation with 100% oxygen Induction Type: IV induction Ventilation: Mask ventilation without difficulty Laryngoscope Size: Mac and 3 Grade View: Grade I Tube type: Oral Tube size: 7.0 mm Number of attempts: 1 Airway Equipment and Method: Stylet and Oral airway Placement Confirmation: ETT inserted through vocal cords under direct vision,  positive ETCO2 and breath sounds checked- equal and bilateral Secured at: 22 cm Tube secured with: Tape Dental Injury: Teeth and Oropharynx as per pre-operative assessment

## 2018-07-13 NOTE — Brief Op Note (Signed)
07/13/2018  4:29 PM  PATIENT:  Anne Daniels  43 y.o. female  PRE-OPERATIVE DIAGNOSIS:  Dysfunctional Uterine Bleeding, Fibroid Uterus, morbid obesity  POST-OPERATIVE DIAGNOSIS:  Dysfunctional Uterine Bleeding, Fibroid Uterus, morbid obesity  PROCEDURE:  Da Vinci robotic total hysterectomy, bilateral salpingectomy  SURGEON:  Surgeon(s) and Role:    * Servando Salina, MD - Primary    * Almquist, Earlyne Iba, MD - Assisting  PHYSICIAN ASSISTANT:   ASSISTANTS: Tiana Loft, M.D.   ANESTHESIA:   general Findings: large bowel adhesion to left IP. Fibroid uterus, nl tubes and ovaries, ureters peristalsing EBL:  25 mL   BLOOD ADMINISTERED:none  DRAINS: none   LOCAL MEDICATIONS USED:  MARCAINE     SPECIMEN:  Source of Specimen:  uterus with cerivx, tubes  DISPOSITION OF SPECIMEN:  PATHOLOGY  COUNTS:  YES  TOURNIQUET:  * No tourniquets in log *  DICTATION: .Other Dictation: Dictation Number [id number]  PLAN OF CARE: Admit for overnight observation  PATIENT DISPOSITION:  PACU - hemodynamically stable.   Delay start of Pharmacological VTE agent (>24hrs) due to surgical blood loss or risk of bleeding: no

## 2018-07-14 ENCOUNTER — Encounter (HOSPITAL_BASED_OUTPATIENT_CLINIC_OR_DEPARTMENT_OTHER): Payer: Self-pay | Admitting: Obstetrics and Gynecology

## 2018-07-14 DIAGNOSIS — N939 Abnormal uterine and vaginal bleeding, unspecified: Secondary | ICD-10-CM | POA: Diagnosis not present

## 2018-07-14 LAB — BASIC METABOLIC PANEL
Anion gap: 5 (ref 5–15)
BUN: 9 mg/dL (ref 6–20)
CO2: 23 mmol/L (ref 22–32)
Calcium: 8.2 mg/dL — ABNORMAL LOW (ref 8.9–10.3)
Chloride: 106 mmol/L (ref 98–111)
Creatinine, Ser: 0.65 mg/dL (ref 0.44–1.00)
GFR calc Af Amer: >60 mL/min (ref >60)
GFR calc non Af Amer: >60 mL/min (ref >60)
Glucose, Bld: 253 mg/dL — ABNORMAL HIGH (ref 70–99)
Potassium: 4.3 mmol/L (ref 3.5–5.1)
Sodium: 134 mmol/L — ABNORMAL LOW (ref 135–145)

## 2018-07-14 LAB — CBC
HCT: 37.9 % (ref 36.0–46.0)
Hemoglobin: 12.7 g/dL (ref 12.0–15.0)
MCH: 32.2 pg (ref 26.0–34.0)
MCHC: 33.5 g/dL (ref 30.0–36.0)
MCV: 96.2 fL (ref 80.0–100.0)
Platelets: 141 10*3/uL — ABNORMAL LOW (ref 150–400)
RBC: 3.94 MIL/uL (ref 3.87–5.11)
RDW: 12.2 % (ref 11.5–15.5)
WBC: 9.4 10*3/uL (ref 4.0–10.5)
nRBC: 0 % (ref 0.0–0.2)

## 2018-07-14 LAB — GLUCOSE, CAPILLARY: Glucose-Capillary: 224 mg/dL — ABNORMAL HIGH (ref 70–99)

## 2018-07-14 MED ORDER — IBUPROFEN 800 MG PO TABS: 800.0000 mg | ORAL_TABLET | Freq: Three times a day (TID) | ORAL | 2 refills | Status: DC

## 2018-07-14 MED ORDER — ACETAMINOPHEN 500 MG PO TABS
ORAL_TABLET | ORAL | Status: AC
  Filled 2018-07-14: qty 2

## 2018-07-14 MED ORDER — OXYCODONE HCL 5 MG PO TABS
ORAL_TABLET | ORAL | Status: AC
  Filled 2018-07-14: qty 1

## 2018-07-14 MED ORDER — OXYCODONE HCL 5 MG PO TABS
ORAL_TABLET | ORAL | Status: AC
  Filled 2018-07-14: qty 2

## 2018-07-14 MED ORDER — LACTATED RINGERS IV BOLUS
500.0000 mL | Freq: Once | INTRAVENOUS | Status: AC
  Administered 2018-07-14: 06:00:00 via INTRAVENOUS
  Filled 2018-07-14: qty 500

## 2018-07-14 NOTE — Op Note (Signed)
NAME: Anne Daniels, Anne Daniels MEDICAL RECORD [id number] ACCOUNT [id number] DATE OF BIRTH:05/19/1975 FACILITY: [facility] LOCATION: [facility] PHYSICIAN:Deavon Podgorski A. Juddson Cobern, MD  OPERATIVE REPORT  DATE OF PROCEDURE:  07/13/2018  PREOPERATIVE DIAGNOSIS:  Abnormal uterine bleeding, fibroid uterus, morbid obesity.  PROCEDURE:  Da Vinci robotic total hysterectomy, bilateral salpingectomy.  POSTOPERATIVE DIAGNOSIS:  Abnormal uterine bleeding, fibroid uterus, morbid obesity.  ANESTHESIA:  General.  SURGEON:  Servando Salina, MD  ASSISTANT:  Tiana Loft MD  DESCRIPTION OF PROCEDURE:  Under adequate general anesthesia, the patient was placed in the dorsal lithotomy position.  She was sterilely prepped and draped in the usual fashion in preparation for robotic approach.  The patient had a rolling anterior abdominal wall.  A weighted speculum was placed in the vagina.  Sims retractor was placed anteriorly.  A 3-way Foley catheter was sterilely placed.  Then, 0 Vicryl figure-of-eight was placed on the anterior and on the posterior lip of the cervix.  The uterus sounded to 8 cm.  It was a retroverted uterus.  A #8 uterine manipulator with a small RUMI cup was placed.  The retractors were removed.  Attention was then turned to the abdomen.  Due to the fact that the patient's umbilicus was invaginated due to her obesity, a decision was made to make a umbilical incision after 0.25% Marcaine was injected.  Incision was made.  Bleeding was noted and the site was hemostased with cautery.  The Veress needle was introduced.  Opening pressure of 4 was noted.  Then, 5.4 liters of CO2 was insufflated.  Veress needle was then removed.  The 8 mm robotic trocar was introduced without incident.  The lighted robotic camera was then inserted and panoramic inspection was notable for an unremarkable entry.  At that point, the  additional robotic port sites were then placed, 2 on the right about 8 cm apart and 1 on the left  most laterally and superiorly to between the left ports an 8 mm AirSeal was placed.  All those instruments were placed under direct visualization.  The robot was then docked in the usual manner.  In arm #1 was the vessel sealer, in arm #3 and #4 respectively was the bipolar forceps as well as the monopolar scissors.  At that point, I went to the surgical console.  At the surgical console, panoramic  inspection was then done.  Both ureters were identified.  There was bowel noted to the proximal portion of the left IP ligament, which was lysed.  Attachment of the adhesions was lysed at that site.  The fallopian tubes were then grasped on the left.  The  mesosalpinx was serially clamped, cauterized and then cut, removing that fallopian tube.  The retroperitoneal space was opened on the left.  The uteroovarian ligament on the left was clamped, cauterized, and cut.  The round ligament was subsequently clamped, cauterized, and cut.  The anterior and posterior leaf of the broad ligament was opened and the bladder peritoneum was opened transversely in that succession.  The uterine vessels skeletonized on the left.  Uterine vessels were then clamped,  cauterized, and cut.  The bladder was further displaced inferiorly.  Same procedure was performed on the contralateral side after identifying the ureter.  Once the uterine vessels were cauterized on the right, the vagina was insufflated.  The bladder was displaced further inferiorly and a circumferential incision was made at the cervicovaginal junction using the upper part of the RUMI cup.  Once this was done, the uterus was detached from  the vagina and was removed.  The bladder was further pushed  inferiorly.  The instruments were then replaced with the monopolar scissors have been replaced by the large mega suture needle driver and on the left the vessel cord clamp sealer was then replaced by the long tip forceps.  A 0 V-Loc suture was then placed and the vagina was  closed in a 2-layer closure using the running stitch of the V-Loc.  Good hemostasis was achieved.  The pedicles were inspected.  Good hemostasis was noted.  Arista plus potato starch was then placed on the vaginal cuff.  The instruments  were removed.  The robot was undocked.  Prior to removing the robotic ports, the abdominal  pressure was decreased and vessels to be noted and inspected.  No additional bleeding site was noted and at that point, decision was then made to remove the robotic port sites, remove the AirSeal and the incisions were then closed with 4-0 Vicryl sutures as well as a 2-0 plain suture for the camera port site.  The bimanual inspection of the vagina showed good approximation of the vaginal cuff.  This had been done intraoperatively as well.   SPECIMEN:  uterus with cervix and fallopian tubes sent to pathology.  ESTIMATED BLOOD LOSS:  25 mL  INTRAOPERATIVE FLUIDS:  1400 mL  URINE OUTPUT:  800 mL  COUNTS:  Sponge and instrument counts x2 was correct.  COMPLICATIONS:  None.  DISPOSITION:  The patient tolerated the procedure well and was transferred to recovery room in stable condition.  TN/NUANCE  D:07/13/2018 T:07/14/2018 JOB:[id number]

## 2018-07-14 NOTE — Discharge Summary (Signed)
Physician Discharge Summary  Patient ID: Anne Daniels MRN: [id number] DOB/AGE: March 21, 1975 43 y.o.  Admit date: 07/13/2018 Discharge date: 07/14/2018  Admission Diagnoses: AUB/DUB, fibroid uterus, morbid obesity  Discharge Diagnoses: same Active Problems:   Dysfunctional uterine bleeding   Status post total hysterectomy Diabetes Chronic HTN  Discharged Condition: stable  Hospital Course: pt underwent davinci robotic total hysterectomy, bilateral salpingectomy.  ImmediatePostop course notable for hypotension related to dehydration and managed with IVF, albumin. Otherwise unremarkable course thereafter  Consults: None  Significant Diagnostic Studies: labs:  CBC Latest Ref Rng & Units 07/14/2018 07/13/2018 06/30/2018  WBC 4.0 - 10.5 K/uL 9.4 9.7 6.2  Hemoglobin 12.0 - 15.0 g/dL 12.7 14.0 14.5  Hematocrit 36.0 - 46.0 % 37.9 42.5 43.6  Platelets 150 - 400 K/uL 141(L) 155 193   BMET    Component Value Date/Time   NA 134 (L) 07/14/2018 0517   NA 140 06/17/2016 1033   K 4.3 07/14/2018 0517   CL 106 07/14/2018 0517   CO2 23 07/14/2018 0517   GLUCOSE 253 (H) 07/14/2018 0517   BUN 9 07/14/2018 0517   BUN 7 06/17/2016 1033   CREATININE 0.65 07/14/2018 0517   CREATININE 0.88 07/11/2015 1102   CALCIUM 8.2 (L) 07/14/2018 0517   GFRNONAA >60 07/14/2018 0517   GFRAA >60 07/14/2018 0517     Treatments: surgery: davinci robotic total hysterectomy, bilateral salpingectomy  Discharge Exam: Blood pressure 124/79, pulse 95, temperature 97.7 F (36.5 C), resp. rate 16, weight 119.3 kg, last menstrual period 06/14/2018, SpO2 93 %. General appearance: alert, cooperative and no distress Resp: clear to auscultation bilaterally Cardio: regular rate and rhythm, S1, S2 normal, no murmur, click, rub or gallop GI: soft obese (+) BS incisions d/c/i Pelvic: deferred Extremities: no edema, redness or tenderness in the calves or thighs Skin: Skin color, texture, turgor normal. No rashes or  lesions  Disposition: Discharge disposition: 01-Home or Self Care       Discharge Instructions    Call MD for:  persistant nausea and vomiting   Complete by:  As directed    Call MD for:  severe uncontrolled pain   Complete by:  As directed    Call MD for:  temperature >100.4   Complete by:  As directed    Diet - low sodium heart healthy   Complete by:  As directed    May walk up steps   Complete by:  As directed      Allergies as of 07/14/2018      Reactions   Penicillins Hives, Swelling   Did it involve swelling of the face/tongue/throat, SOB, or low BP? Yes Did it involve sudden or severe rash/hives, skin peeling, or any reaction on the inside of your mouth or nose? Yes Did you need to seek medical attention at a hospital or doctor's office? Yes When did it last happen?Early 20s If all above answers are "NO", may proceed with cephalosporin use.   Progesterone Swelling, Other (See Comments)   Hives, tongue swelling, high fever, itchy    Other Other (See Comments)   Black olives cause migraines      Medication List    TAKE these medications   acetaminophen 500 MG tablet Commonly known as:  TYLENOL Take 1,000 mg by mouth every 8 (eight) hours as needed (pain).   aspirin-acetaminophen-caffeine 250-250-65 MG tablet Commonly known as:  EXCEDRIN MIGRAINE Take 2 tablets by mouth every 6 (six) hours as needed for headache.   Cranberry 500 MG Caps  Take 500 mg by mouth daily.   diphenhydrAMINE 25 MG tablet Commonly known as:  BENADRYL Take 1 tablet (25 mg total) by mouth every 6 (six) hours. What changed:    when to take this  reasons to take this   gabapentin 300 MG capsule Commonly known as:  NEURONTIN TAKE 1 CAPSULE BY MOUTH EVERY MORNING, 2 CAPSULES IN THE AFTERNOON, AND 2 CAPSULES IN THE EVENING   ibuprofen 800 MG tablet Commonly known as:  ADVIL Take 1 tablet (800 mg total) by mouth every 8 (eight) hours.   losartan 25 MG tablet Commonly known  as:  COZAAR Take 1 tablet (25 mg total) by mouth daily.   metFORMIN 1000 MG tablet Commonly known as:  GLUCOPHAGE Take 1 tablet (1,000 mg total) by mouth 2 (two) times daily with a meal. take 1 tablet by mouth twice a day WITH A MEAL   PARoxetine 37.5 MG 24 hr tablet Commonly known as:  PAXIL-CR TAKE 1 TABLET(37.5 MG) BY MOUTH DAILY What changed:  See the new instructions.      Follow-up Information    Servando Salina, MD Follow up in 2 week(s).   Specialty:  Obstetrics and Gynecology Contact information: [address] [place] [address] [phone]           Signed: Alanda Slim A Bria Portales 07/14/2018, 8:00 AM

## 2018-07-14 NOTE — Discharge Instructions (Signed)
Laparoscopically Assisted Vaginal Hysterectomy, Care After °This sheet gives you information about how to care for yourself after your procedure. Your health care provider may also give you more specific instructions. If you have problems or questions, contact your health care provider. °What can I expect after the procedure? °After the procedure, it is common to have: °· Soreness and numbness in your incision areas. °· Abdominal pain. You will be given pain medicine to control it. °· Vaginal bleeding and discharge. You will need to use a sanitary napkin after this procedure. °· Sore throat from the breathing tube that was inserted during surgery. °Follow these instructions at home: °Medicines °· Take over-the-counter and prescription medicines only as told by your health care provider. °· Do not take aspirin or ibuprofen. These medicines can cause bleeding. °· Do not drive or use heavy machinery while taking prescription pain medicine. °· Do not drive for 24 hours if you were given a medicine to help you relax (sedative) during the procedure. °Incision care ° °· Follow instructions from your health care provider about how to take care of your incisions. Make sure you: °? Wash your hands with soap and water before you change your bandage (dressing). If soap and water are not available, use hand sanitizer. °? Change your dressing as told by your health care provider. °? Leave stitches (sutures), skin glue, or adhesive strips in place. These skin closures may need to stay in place for 2 weeks or longer. If adhesive strip edges start to loosen and curl up, you may trim the loose edges. Do not remove adhesive strips completely unless your health care provider tells you to do that. °· Check your incision area every day for signs of infection. Check for: °? Redness, swelling, or pain. °? Fluid or blood. °? Warmth. °? Pus or a bad smell. °Activity °· Get regular exercise as told by your health care provider. You may be  told to take short walks every day and go farther each time. °· Return to your normal activities as told by your health care provider. Ask your health care provider what activities are safe for you. °· Do not douche, use tampons, or have sexual intercourse for at least 6 weeks, or until your health care provider gives you permission. °· Do not lift anything that is heavier than 10 lb (4.5 kg), or the limit that your health care provider tells you, until he or she says that it is safe. °General instructions °· Do not take baths, swim, or use a hot tub until your health care provider approves. Take showers instead of baths. °· Do not drive for 24 hours if you received a sedative. °· Do not drive or operate heavy machinery while taking prescription pain medicine. °· To prevent or treat constipation while you are taking prescription pain medicine, your health care provider may recommend that you: °? Drink enough fluid to keep your urine clear or pale yellow. °? Take over-the-counter or prescription medicines. °? Eat foods that are high in fiber, such as fresh fruits and vegetables, whole grains, and beans. °? Limit foods that are high in fat and processed sugars, such as fried and sweet foods. °· Keep all follow-up visits as told by your health care provider. This is important. °Contact a health care provider if: °· You have signs of infection, such as: °? Redness, swelling, or pain around your incision sites. °? Fluid or blood coming from an incision. °? An incision that feels warm to the   touch. ? Pus or a bad smell coming from an incision.  Your incision breaks open.  Your pain medicine is not helping.  You feel dizzy or light-headed.  You have pain or bleeding when you urinate.  You have persistent nausea and vomiting.  You have blood, pus, or a bad-smelling discharge from your vagina. Get help right away if:  You have a fever.  You have severe abdominal pain.  You have chest pain.  You have  shortness of breath.  You faint.  You have pain, swelling, or redness in your leg.  You have heavy bleeding from your vagina. Summary  After the procedure, it is common to have abdominal pain and vaginal bleeding.  You should not drive or lift heavy objects until your health care provider says that it is safe.  Contact your health care provider if you have any symptoms of infection, excessive vaginal bleeding, nausea, vomiting, or shortness of breath. This information is not intended to replace advice given to you by your health care provider. Make sure you discuss any questions you have with your health care provider. Document Released: 01/21/2011 Document Revised: 03/30/2016 Document Reviewed: 03/30/2016 Elsevier Interactive Patient Education  2019 [employer]. Call if temperature greater than equal to 100.4, nothing per vagina for 4-6 weeks or severe nausea vomiting, increased incisional pain , drainage or redness in the incision site, no straining with bowel movements, showers no bath

## 2018-08-04 ENCOUNTER — Other Ambulatory Visit: Payer: Self-pay | Admitting: Family Medicine

## 2018-10-24 ENCOUNTER — Other Ambulatory Visit: Payer: Self-pay

## 2018-10-24 ENCOUNTER — Telehealth (INDEPENDENT_AMBULATORY_CARE_PROVIDER_SITE_OTHER): Payer: 59 | Admitting: Family Medicine

## 2018-10-24 DIAGNOSIS — J029 Acute pharyngitis, unspecified: Secondary | ICD-10-CM | POA: Diagnosis not present

## 2018-10-24 MED ORDER — CLINDAMYCIN HCL 300 MG PO CAPS: 300.0000 mg | ORAL_CAPSULE | Freq: Three times a day (TID) | ORAL | 0 refills | Status: AC

## 2018-10-24 NOTE — Assessment & Plan Note (Signed)
DDx includes allergies vs viral vs bacterial. Given patient appointment was done over the phone and she had sore throat with no cough it could be strep pharyngitis. She has had it in the past. No history of allergies so I found that to be less likely but still possible. It is most likely viral pharnygitis.  - Symptom control with Dayquil/Nyquil for fever, headache - Cough drops, gargling with warm salt water for sore throat - Due to penicillin allergy I sent in prescription for Clindamycin 300mg  TID for 10 days in the event patient does not get better after day 7 or if it gets worse. I am hopeful she does not need it but given we cannot bring her in to test her due to [facility] sending in antibiotics now to save her from having to get another appointment or call at a later date. - Return precautions discussed with patient.

## 2018-10-24 NOTE — Progress Notes (Signed)
[facility] Telemedicine Visit  Patient consented to have virtual visit. Method of visit: Telephone  Encounter participants: Patient: VENIECE BRINGER - located at home in [facility] Provider: Nuala Alpha - located at [facility] Others (if applicable): None  Chief Complaint: Nausea with sore throat  HPI:  Nauseated x 2 days, headache, and sore throat. Headache feels the same as in the past, and feels like pressure in her "sinsues".  no congestion, sneezing, but has watery eyes. She had strep throat "around this time" last year and it felt similar. She has had subjective fevers but no recorded temperature over 100.4. No difficulty breathing, SOB, cough, abdominal pain, or vomiting. She does endorse a decreased appetite, but no muscle aches or joint aches, diarrhea or constipation, no dysuria, but has increased urinary frequency. She also denies any difficulty swallowing.  ROS: per HPI  Pertinent PMHx: HLD, Obesity, UTI, Hx of Strep pharyngitis  Exam:  Respiratory: speaking in full sentences  Assessment/Plan:  Sore throat DDx includes allergies vs viral vs bacterial. Given patient appointment was done over the phone and she had sore throat with no cough it could be strep pharyngitis. She has had it in the past. No history of allergies so I found that to be less likely but still possible. It is most likely viral pharnygitis.  - Symptom control with Dayquil/Nyquil for fever, headache - Cough drops, gargling with warm salt water for sore throat - Due to penicillin allergy I sent in prescription for Clindamycin 300mg  TID for 10 days in the event patient does not get better after day 7 or if it gets worse. I am hopeful she does not need it but given we cannot bring her in to test her due to [facility] sending in antibiotics now to save her from having to get another appointment or call at a later date. - Return precautions discussed with patient.    Time spent during visit with  patient: >11 minutes  Harolyn Rutherford, [place], PGY-3

## 2018-11-02 ENCOUNTER — Other Ambulatory Visit: Payer: Self-pay | Admitting: Family Medicine

## 2018-11-03 ENCOUNTER — Other Ambulatory Visit: Payer: Self-pay | Admitting: *Deleted

## 2018-11-03 MED ORDER — GABAPENTIN 300 MG PO CAPS: ORAL_CAPSULE | ORAL | 0 refills | Status: DC

## 2018-11-17 ENCOUNTER — Other Ambulatory Visit: Payer: Self-pay

## 2018-11-17 ENCOUNTER — Telehealth (INDEPENDENT_AMBULATORY_CARE_PROVIDER_SITE_OTHER): Payer: 59 | Admitting: Family Medicine

## 2018-11-17 DIAGNOSIS — R399 Unspecified symptoms and signs involving the genitourinary system: Secondary | ICD-10-CM

## 2018-11-17 MED ORDER — CEPHALEXIN 500 MG PO CAPS: 500.0000 mg | ORAL_CAPSULE | Freq: Four times a day (QID) | ORAL | 0 refills | Status: AC

## 2018-11-17 NOTE — Progress Notes (Signed)
[facility] Telemedicine Visit  Patient consented to have virtual visit. Method of visit: Telephone  Encounter participants: Patient: Anne Daniels - located at home Provider: Gerlene Fee - located at home office  Chief Complaint: UTI symptoms  HPI: Anne Daniels states she had a hysterectomy on May 28 of this year, the surgery went well and she was released from her OBGYN 08/28/2018. She has not had a UTI since her surgery until the start of dysuria on Monday. She began having issues urinating stating it felt like urinating sandpaper and looked dark and maybe even blood tinged. She is also experiencing associated lower back pain, headache, low grade temp of 99.0 taken yesterday, and nausea.  She tried azo without noticeable benefit. She did increase her water and cranberry juice that appeared to improve the dark tint of her urine.  Denies lateralizing flank pain, abdominal pain, diarrhea, constipation, rash in the vaginal area, and abnormal vaginal discharge.  ROS: per HPI  Pertinent PMHx:  Patient Active Problem List   Diagnosis Date Noted  . Status post total hysterectomy 07/13/2018  . UTI symptoms 08/25/2017  . Neuropathy, diabetic ([place]) 05/21/2014  . Abnormal nuclear stress test   . Obstructive sleep apnea 04/16/2011  . HOT FLASHES 10/17/2009  . ROTATOR CUFF INJURY, LEFT SHOULDER 02/27/2009  . CERVICAL RADICULOPATHY, LEFT 02/21/2009  . Depression, unipolar ([place]) 09/03/2008  . TRANSAMINASES, SERUM, ELEVATED 06/26/2008  . POSTURAL HYPOTENSION 04/02/2008  . Diabetes mellitus without complication ([place]) [id number]  . DISORDER, DYSMETABOLIC SYNDROME X [id number]  . SPRAIN/STRAIN, LUMBAR REGION 09/15/2006  . HYPERTRIGLYCERIDEMIA 07/05/2006  . KNEE PAIN, LEFT, CHRONIC 06/02/2006  . OBESITY, NOS 04/14/2006     Exam:  General: Sounds well, no acute distress. Age appropriate. Psych: normal affect   Assessment/Plan:  UTI symptoms Appears to  be similar symptoms from her past UTI history. She has seen some improvement with increasing fluids and I have encourage this to continue -Start 5 day course of Keflex (patient with penicillin allergy but has had Keflex in the past without side effects) -Monitor symptoms and follow up PRN or if symptoms fail to improve    Time spent during visit with patient: 15 minutes  Gerlene Fee, [place] PGY-1

## 2018-11-17 NOTE — Assessment & Plan Note (Signed)
Appears to be similar symptoms from her past UTI history. She has seen some improvement with increasing fluids and I have encourage this to continue -Start 5 day course of Keflex (patient with penicillin allergy but has had Keflex in the past without side effects) -Monitor symptoms and follow up PRN or if symptoms fail to improve

## 2018-12-08 ENCOUNTER — Other Ambulatory Visit: Payer: Self-pay | Admitting: Family Medicine

## 2019-04-09 ENCOUNTER — Other Ambulatory Visit: Payer: Self-pay | Admitting: *Deleted

## 2019-04-09 MED ORDER — LOSARTAN POTASSIUM 25 MG PO TABS: 25.0000 mg | ORAL_TABLET | Freq: Every day | ORAL | 3 refills | Status: DC

## 2019-05-07 ENCOUNTER — Other Ambulatory Visit: Payer: Self-pay | Admitting: *Deleted

## 2019-05-08 MED ORDER — PAROXETINE HCL ER 37.5 MG PO TB24: 37.5000 mg | ORAL_TABLET | Freq: Every day | ORAL | 0 refills | Status: DC

## 2019-06-06 ENCOUNTER — Other Ambulatory Visit: Payer: Self-pay | Admitting: Family Medicine

## 2019-06-14 ENCOUNTER — Ambulatory Visit
Admission: RE | Admit: 2019-06-14 | Discharge: 2019-06-14 | Disposition: A | Discharge status: Home / Self Care | Payer: Managed Care, Other (non HMO) | Source: Ambulatory Visit | Attending: Sports Medicine | Admitting: Sports Medicine

## 2019-06-14 ENCOUNTER — Ambulatory Visit (INDEPENDENT_AMBULATORY_CARE_PROVIDER_SITE_OTHER): Payer: Managed Care, Other (non HMO) | Admitting: Sports Medicine

## 2019-06-14 ENCOUNTER — Other Ambulatory Visit: Payer: Self-pay

## 2019-06-14 VITALS — BP 128/88 | Ht 67.0 in | Wt 255.4 lb

## 2019-06-14 DIAGNOSIS — M25562 Pain in left knee: Secondary | ICD-10-CM | POA: Diagnosis not present

## 2019-06-14 MED ORDER — GABAPENTIN 300 MG PO CAPS: 300.0000 mg | ORAL_CAPSULE | Freq: Every day | ORAL | 1 refills | Status: DC

## 2019-06-15 ENCOUNTER — Encounter: Payer: Self-pay | Admitting: Sports Medicine

## 2019-06-15 NOTE — Progress Notes (Signed)
   Subjective:    Patient ID: Anne Daniels, female    DOB: 03-02-1975, 44 y.o.   MRN: [id number]  HPI chief complaint: Left knee pain  Very pleasant 44 year old female comes in today complaining of left knee pain. She is status post ACL reconstruction at the age of 45. This was followed by 2 subsequent arthroscopies, around the ages of 36 and 19. About 10 years ago she started to have pain in her knee and was diagnosed with arthritis. She had a couple of cortisone injections in the past which were helpful. Last Friday she was holding open a door at work and as she twisted her knee gave way. She fell to the ground with diffuse pain around the knee. She had significant swelling at the time which has improved. She took some Motrin and applied ice. Both were somewhat helpful. She has not had any recent imaging. No pain in the right knee. She does endorse some numbness around the knee which is normal postoperative numbness. No radiating numbness or tingling down the leg. She states that she has lost 30 pounds recently but despite this she feels like her knee pain is overall worsening.  Past medical history reviewed Medications reviewed Allergies reviewed    Review of Systems As above    Objective:   Physical Exam  Well-developed, well-nourished. No acute distress. Awake alert and oriented x3. Vital signs reviewed.  Left knee: Well-healed surgical scars. No obvious effusion. Normal alignment. Range of motion is 0 to 130. Trace patellofemoral crepitus. Patient is tender to palpation along the medial joint line. No tenderness along the lateral joint line. Positive Thessaly's. Negative McMurray's. Knee is stable to valgus varus stressing. 1+ laxity with anterior drawer testing. Negative posterior drawer. Neurovascularly intact distally.  X-rays of the left knee including AP, lateral, and sunrise views show evidence of her prior ACL reconstruction. There is mild to moderate tricompartmental DJD,  most pronounced in the medial compartment. This is new when compared to x-ray in 2014.       Assessment & Plan:   Left knee pain secondary to DJD status post remote ACL reconstruction  I had a long talk with the patient regarding treatment options for knee arthritis. Were going to start with some isometric quad and hip abductor strengthening exercises.  She is fitted with a J&J brace to wear with activity.  Follow-up with me again in 4 weeks.  Future treatments could include cortisone injection, viscosupplementation, or physical therapy.

## 2019-07-06 ENCOUNTER — Other Ambulatory Visit: Payer: Self-pay | Admitting: Family Medicine

## 2019-07-12 ENCOUNTER — Ambulatory Visit: Payer: Managed Care, Other (non HMO) | Admitting: Sports Medicine

## 2019-07-24 ENCOUNTER — Encounter: Payer: Self-pay | Admitting: Sports Medicine

## 2019-07-24 ENCOUNTER — Ambulatory Visit (INDEPENDENT_AMBULATORY_CARE_PROVIDER_SITE_OTHER): Payer: Managed Care, Other (non HMO) | Admitting: Sports Medicine

## 2019-07-24 ENCOUNTER — Other Ambulatory Visit: Payer: Self-pay

## 2019-07-24 VITALS — BP 127/73 | Ht 67.0 in | Wt 253.0 lb

## 2019-07-24 DIAGNOSIS — M25562 Pain in left knee: Secondary | ICD-10-CM | POA: Diagnosis not present

## 2019-07-24 NOTE — Progress Notes (Signed)
   [facility] [address] [place], [place] [address] Phone: [phone] Fax: [phone]   Patient Name: Anne Daniels Date of Birth: [birth year] Medical Record Number: [id number] Gender: female Date of Encounter: 07/24/2019  SUBJECTIVE:      Chief Complaint:  Left knee pain   HPI:  Patient is 44 year old female following up for left knee pain.  As a reminder she had a traumatic injury when she was a teenager that subsequently has resulted in 3 knee surgeries over her lifetime.  She was seen 6 weeks ago and started using a J brace which is helped with her pain and swelling.  She is on her feet up to 10 hours a day on concrete.  She feels more recently she has had patella subluxation.  After her surgeries in the past, she felt physical therapy was very beneficial for her.  She has been doing her home exercises and taking daily NSAIDs and gabapentin with mild relief.  She denies any catching and the pain is not waking her up at night.     ROS:     See HPI.   PERTINENT  PMH / PSH / FH / SH:  Past Medical, Surgical, Social, and Family History Reviewed & Updated in the EMR.    OBJECTIVE:  BP 127/73   Ht 5\' 7"  (1.702 m)   Wt 253 lb (114.8 kg)   BMI 39.63 kg/m  Physical Exam:  Vital signs are reviewed.   GEN: Alert and oriented, NAD Pulm: Breathing unlabored PSY: normal mood, congruent affect  MSK: Left knee: Mild prepatellar swelling TTP at posterior medial joint line ROM full in flexion and extension and lower leg rotation. Ligaments with solid consistent endpoints including ACL, PCL, LCL, MCL. Negative Mcmurray's and Thessaly tests. painful patellar compression. Patellar glide with crepitus. Patellar and quadriceps tendons unremarkable. Hamstring and quadriceps strength is normal.  Neurovascularly intact.   ASSESSMENT & PLAN:   1. Acute on chronic left knee pain  Given the amount of arthritis we know objectively patient has in  the improvement in her symptoms with the J brace, we will trial formal physical therapy at [facility] again since they have worked with her in the past.  Continue current medication.  She will follow-up with [place] in about 6 weeks at which time if there is little to no improvement, we can consider an intra-articular injection.   Lanier Clam, DO, ATC Sports Medicine Fellow  I observed and examined the patient with Dr. Kathrynn Speed and agree with assessment and plan.  Note reviewed and modified by me. Ila Mcgill, MD

## 2019-08-13 ENCOUNTER — Other Ambulatory Visit: Payer: Self-pay | Admitting: *Deleted

## 2019-08-13 MED ORDER — GABAPENTIN 300 MG PO CAPS: 300.0000 mg | ORAL_CAPSULE | Freq: Every day | ORAL | 1 refills | Status: DC

## 2019-09-04 ENCOUNTER — Other Ambulatory Visit: Payer: Self-pay | Admitting: Family Medicine

## 2019-10-16 ENCOUNTER — Other Ambulatory Visit: Payer: Self-pay | Admitting: *Deleted

## 2019-10-16 MED ORDER — GABAPENTIN 300 MG PO CAPS: 300.0000 mg | ORAL_CAPSULE | Freq: Every day | ORAL | 2 refills | Status: DC
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
## 2019-10-25 ENCOUNTER — Ambulatory Visit (INDEPENDENT_AMBULATORY_CARE_PROVIDER_SITE_OTHER): Payer: Managed Care, Other (non HMO) | Admitting: Interventional Cardiology

## 2019-10-25 ENCOUNTER — Other Ambulatory Visit: Payer: Self-pay

## 2019-10-25 ENCOUNTER — Encounter: Payer: Self-pay | Admitting: Interventional Cardiology

## 2019-10-25 ENCOUNTER — Telehealth: Payer: Self-pay | Admitting: *Deleted

## 2019-10-25 VITALS — BP 122/90 | HR 65 | Ht 67.0 in | Wt 246.2 lb

## 2019-10-25 DIAGNOSIS — R55 Syncope and collapse: Secondary | ICD-10-CM | POA: Diagnosis not present

## 2019-10-25 DIAGNOSIS — R400 Somnolence: Secondary | ICD-10-CM

## 2019-10-25 DIAGNOSIS — Z7189 Other specified counseling: Secondary | ICD-10-CM

## 2019-10-25 DIAGNOSIS — G4733 Obstructive sleep apnea (adult) (pediatric): Secondary | ICD-10-CM | POA: Diagnosis not present

## 2019-10-25 DIAGNOSIS — E119 Type 2 diabetes mellitus without complications: Secondary | ICD-10-CM | POA: Diagnosis not present

## 2019-10-25 DIAGNOSIS — R0683 Snoring: Secondary | ICD-10-CM

## 2019-10-25 NOTE — Patient Instructions (Signed)
Medication Instructions:  Your physician recommends that you continue on your current medications as directed. Please refer to the Current Medication list given to you today.  *If you need a refill on your cardiac medications before your next appointment, please call your pharmacy*   Lab Work: None If you have labs (blood work) drawn today and your tests are completely normal, you will receive your results only by: Marland Kitchen MyChart Message (if you have MyChart) OR . A paper copy in the mail If you have any lab test that is abnormal or we need to change your treatment, we will call you to review the results.   Testing/Procedures: Your physician has recommended that you have a sleep study. This test records several body functions during sleep, including: brain activity, eye movement, oxygen and carbon dioxide blood levels, heart rate and rhythm, breathing rate and rhythm, the flow of air through your mouth and nose, snoring, body muscle movements, and chest and belly movement.    Follow-Up: At [facility], you and your health needs are our priority.  As part of our continuing mission to provide you with exceptional heart care, we have created designated Provider Care Teams.  These Care Teams include your primary Cardiologist (physician) and Advanced Practice Providers (APPs -  Physician Assistants and Nurse Practitioners) who all work together to provide you with the care you need, when you need it.  We recommend signing up for the patient portal called "MyChart".  Sign up information is provided on this After Visit Summary.  MyChart is used to connect with patients for Virtual Visits (Telemedicine).  Patients are able to view lab/test results, encounter notes, upcoming appointments, etc.  Non-urgent messages can be sent to your provider as well.   To learn more about what you can do with MyChart, go to [email or web address].    Your next appointment:   As needed  The format for your next  appointment:   In Person  Provider:   You may see Dr. Daneen Schick or one of the following Advanced Practice Providers on your designated Care Team:    Truitt Merle, NP  Cecilie Kicks, NP  Kathyrn Drown, NP    Other Instructions

## 2019-10-25 NOTE — Telephone Encounter (Signed)
Sleepiness scale: 1.=3 2=3 3=2 4=3 5=3 6=2 7=0

## 2019-10-25 NOTE — Telephone Encounter (Signed)
-----   Message from Loren Racer, RN sent at 10/25/2019 10:24 AM EDT ----- Sleep study ordered for pt.  Gae Bon- this is the one I gave you the sleepiness scale on

## 2019-10-26 ENCOUNTER — Telehealth: Payer: Self-pay | Admitting: *Deleted

## 2019-10-26 NOTE — Telephone Encounter (Signed)
PA request for Sleep study faxed to [facility].

## 2019-10-26 NOTE — Telephone Encounter (Signed)
PA for in lab sleep study done with [employer] over the phone. [phone]) Clinicals faxed to [phone]. Pending case ID:[id number].

## 2019-11-22 ENCOUNTER — Telehealth: Payer: Self-pay | Admitting: *Deleted

## 2019-11-22 NOTE — Telephone Encounter (Signed)
Staff message sent to Gae Bon in lab study denied. HST approved. Auth # [id number]. Valid dates 11/22/19 to 02/20/20.

## 2019-11-27 ENCOUNTER — Telehealth: Payer: Self-pay | Admitting: *Deleted

## 2019-11-27 DIAGNOSIS — G4733 Obstructive sleep apnea (adult) (pediatric): Secondary | ICD-10-CM

## 2019-11-27 NOTE — Telephone Encounter (Signed)
-----   Message from Lauralee Evener, [place] sent at 11/22/2019  9:49 AM EDT ----- IN -LAB sleep study denied. HST approved. Auth # [id number] Valid dates 11/22/19 to 02/20/20. ----- Message ----- From: Loren Racer, RN Sent: 10/25/2019  10:24 AM EDT To: Freada Bergeron, CMA, Cv Div Sleep Studies  Sleep study ordered for pt.  Gae Bon- this is the one I gave you the sleepiness scale on

## 2019-11-28 NOTE — Telephone Encounter (Signed)
Patient is aware and agreeable to Home Sleep Study through [facility]. Patient is scheduled for 01/01/20 at 11:30 to pick up home sleep kit and meet with Respiratory therapist at [facility]. Patient is aware that if this appointment date and time does not work for them they should contact Artis Delay directly at [phone]. Patient is aware that a sleep packet will be sent from [facility] in week. Patient is agreeable to treatment and thankful for call.

## 2019-12-07 ENCOUNTER — Other Ambulatory Visit: Payer: Self-pay | Admitting: Family Medicine

## 2019-12-07 NOTE — Telephone Encounter (Signed)
She is not my patient I think she is switched to internal medicine.

## 2020-01-01 ENCOUNTER — Other Ambulatory Visit: Payer: Self-pay

## 2020-01-01 ENCOUNTER — Ambulatory Visit (HOSPITAL_BASED_OUTPATIENT_CLINIC_OR_DEPARTMENT_OTHER): Payer: Managed Care, Other (non HMO) | Attending: Interventional Cardiology | Admitting: Cardiology

## 2020-01-01 DIAGNOSIS — G4733 Obstructive sleep apnea (adult) (pediatric): Secondary | ICD-10-CM

## 2020-01-08 NOTE — Procedures (Signed)
Erroneous encounter

## 2020-01-08 NOTE — Progress Notes (Signed)
This encounter was created in error - please disregard.

## 2020-06-10 IMAGING — DX DG KNEE AP/LAT W/ SUNRISE*L*
3 series · 3 of 3 positions shown · non-contrast
Comparison: 12/05/2012

CLINICAL DATA: Left knee pain

EXAM:
LEFT KNEE 3 VIEWS

[dg knee ap/lat w/ sunrise left (1 of 3)]
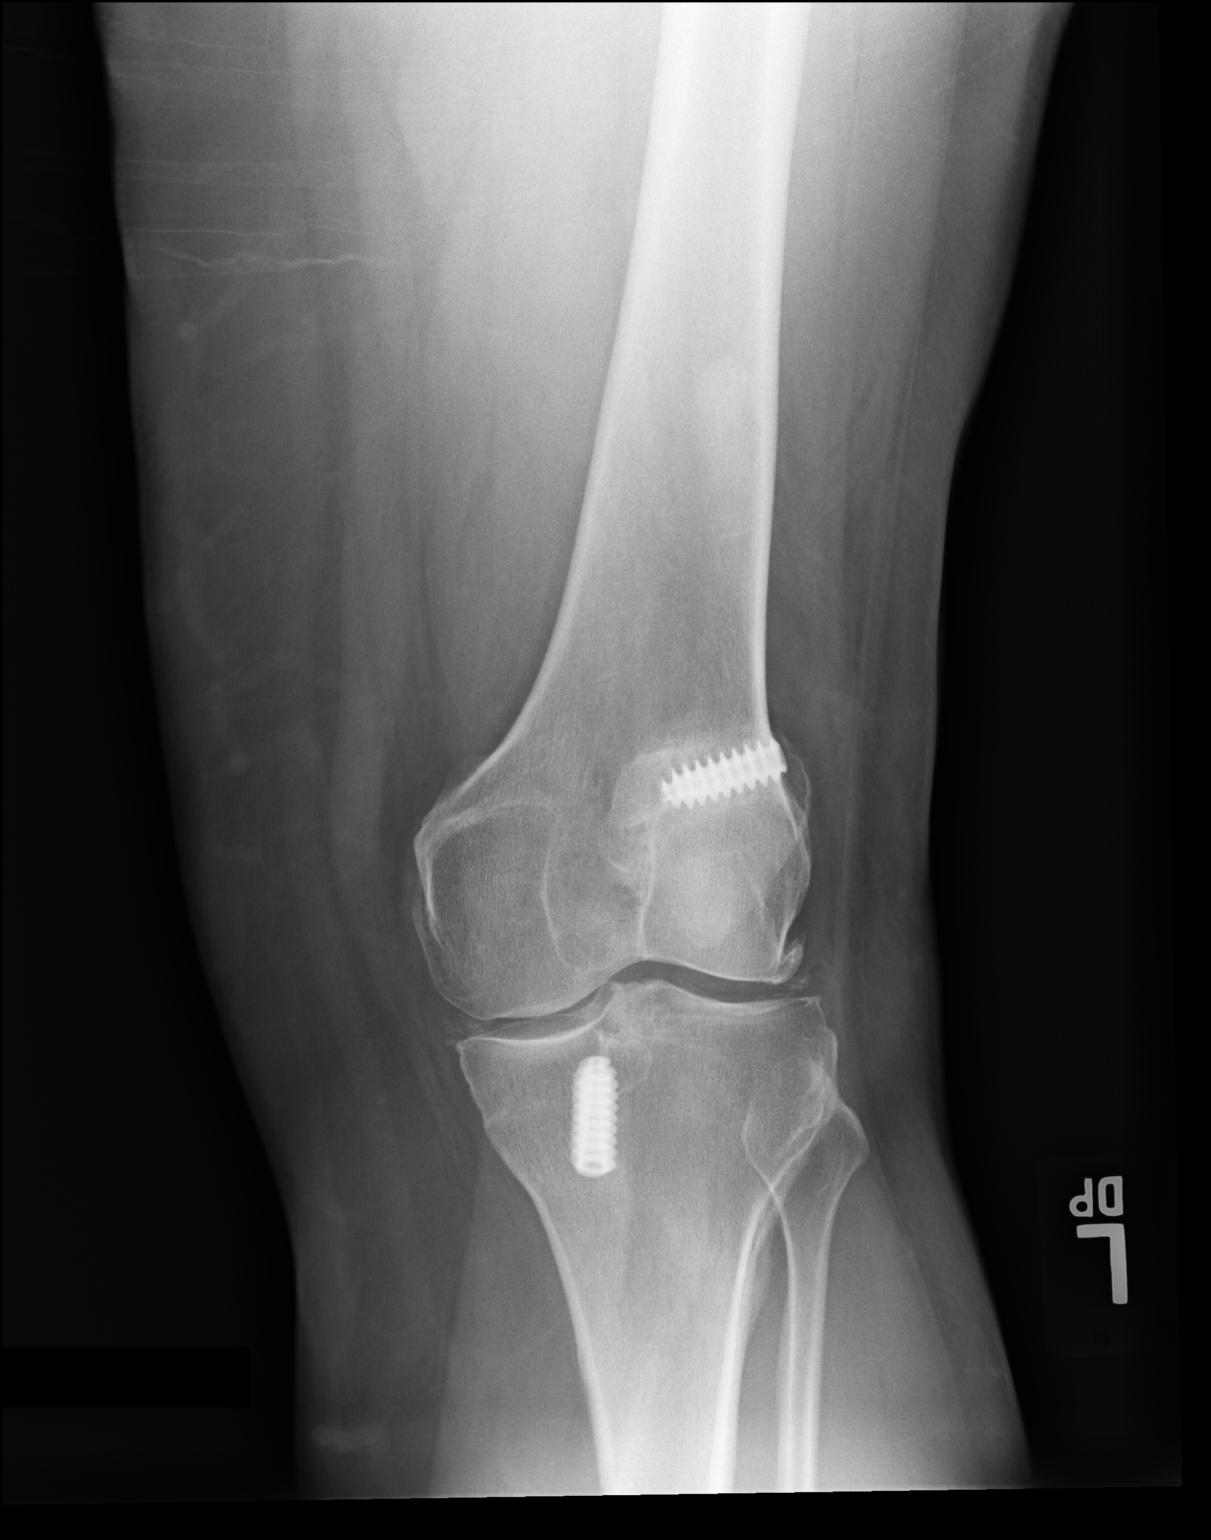

[dg knee ap/lat w/ sunrise left (2 of 3)]
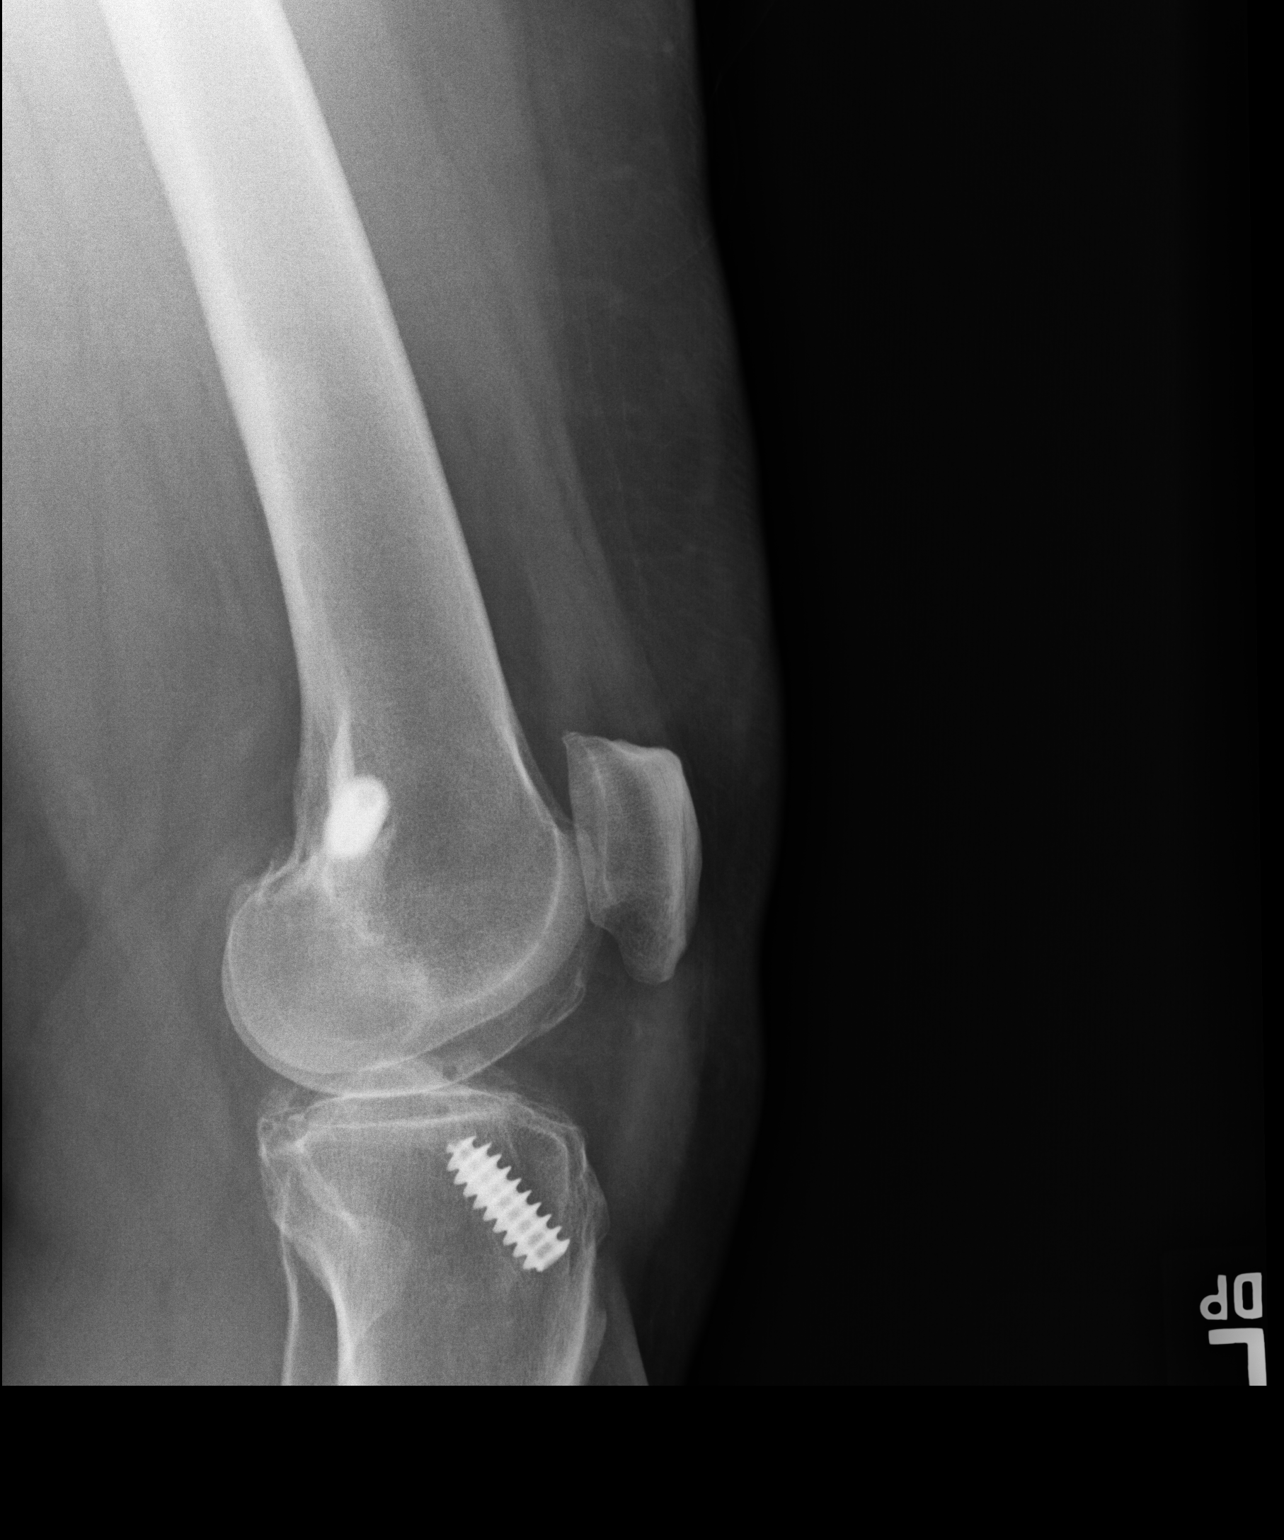

[dg knee ap/lat w/ sunrise left (3 of 3)]
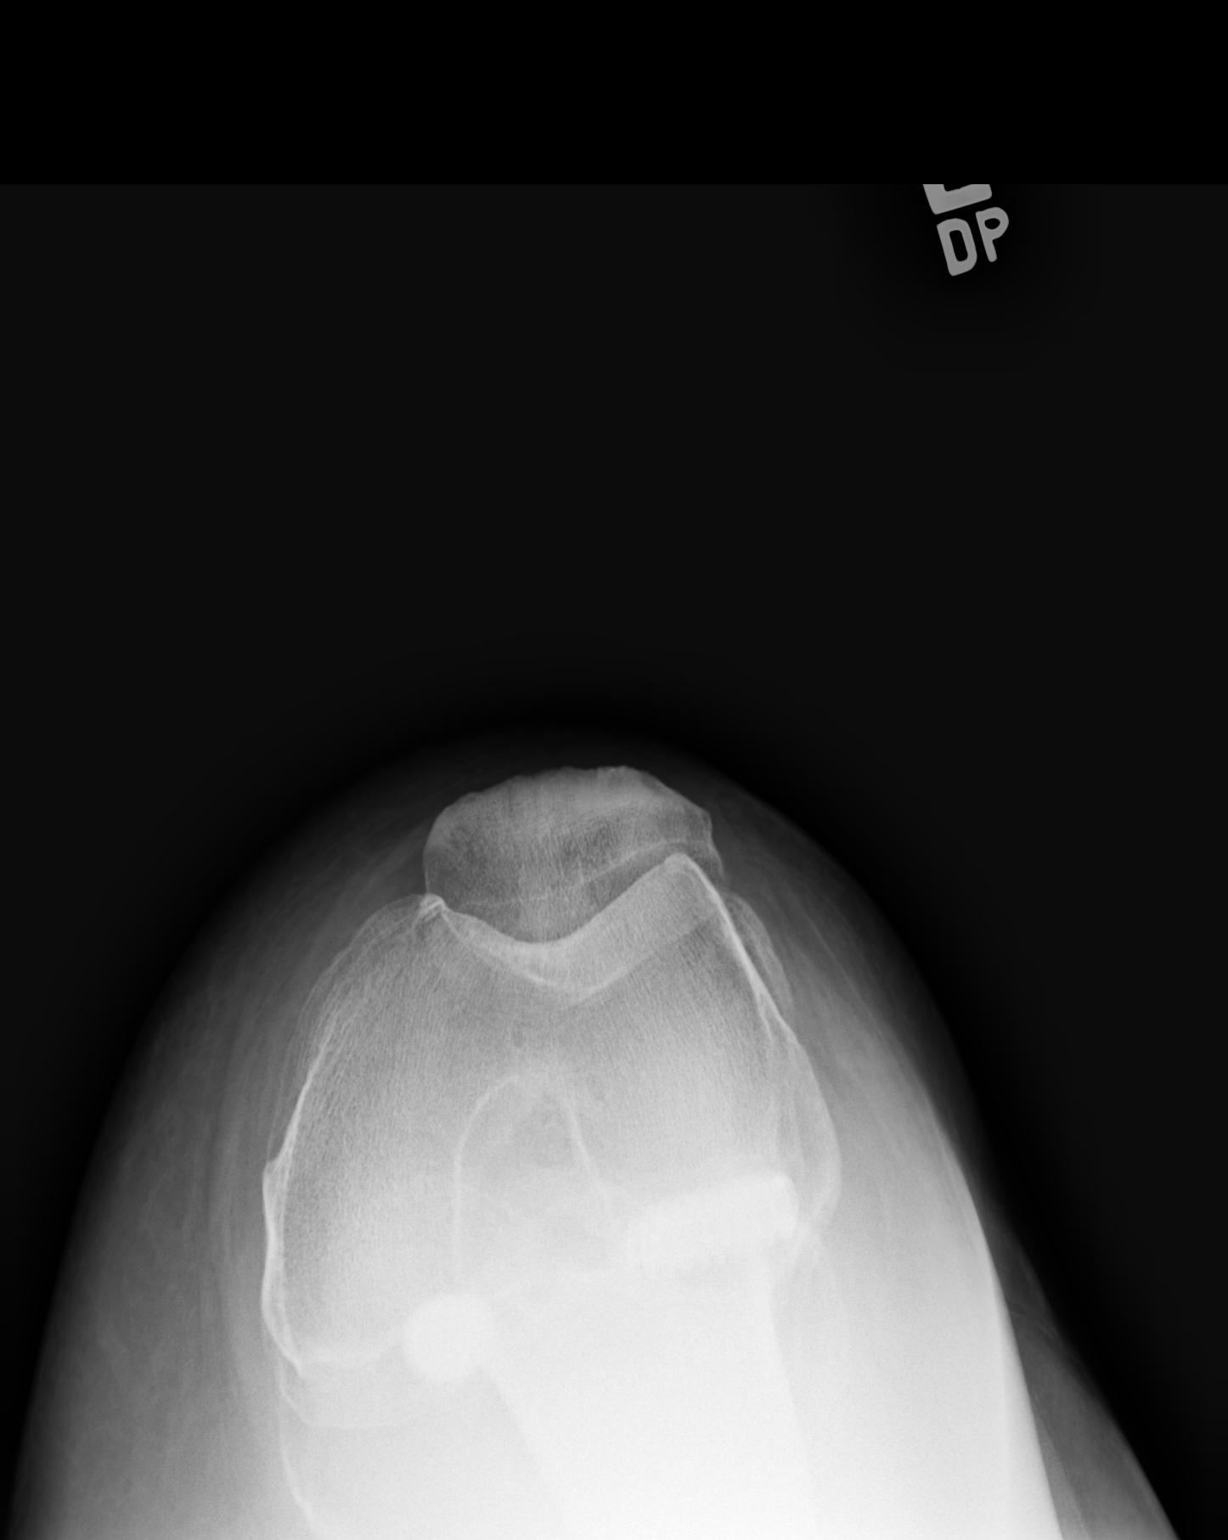

[3 of 3 positions shown; findings below may reference images not displayed]

FINDINGS: No fracture or dislocation of the left knee. Evidence of prior
tibial tunnel ACL graft reconstruction. There is mild-to-moderate
tricompartmental joint space narrowing and osteophytosis, which is
essentially new compared to prior examination dated 9686. No knee
joint effusion. Soft tissues are unremarkable.
IMPRESSION: 1. No fracture or dislocation of the left knee. Evidence of prior
tibial tunnel ACL graft reconstruction.

2. There is mild-to-moderate tricompartmental joint space narrowing
and osteophytosis, which is essentially new compared to prior
examination dated [DATE].

## 2020-12-16 ENCOUNTER — Encounter: Payer: Self-pay | Admitting: *Deleted

## 2020-12-17 ENCOUNTER — Encounter: Payer: Self-pay | Admitting: Neurology

## 2020-12-17 ENCOUNTER — Ambulatory Visit (INDEPENDENT_AMBULATORY_CARE_PROVIDER_SITE_OTHER): Payer: Managed Care, Other (non HMO) | Admitting: Neurology

## 2020-12-17 VITALS — BP 136/79 | HR 66 | Ht 67.0 in | Wt 238.0 lb

## 2020-12-17 DIAGNOSIS — G2581 Restless legs syndrome: Secondary | ICD-10-CM

## 2020-12-17 DIAGNOSIS — E669 Obesity, unspecified: Secondary | ICD-10-CM | POA: Diagnosis not present

## 2020-12-17 DIAGNOSIS — G4719 Other hypersomnia: Secondary | ICD-10-CM

## 2020-12-17 DIAGNOSIS — G4761 Periodic limb movement disorder: Secondary | ICD-10-CM

## 2020-12-17 DIAGNOSIS — R519 Headache, unspecified: Secondary | ICD-10-CM

## 2020-12-17 DIAGNOSIS — R0681 Apnea, not elsewhere classified: Secondary | ICD-10-CM | POA: Diagnosis not present

## 2020-12-17 DIAGNOSIS — R0683 Snoring: Secondary | ICD-10-CM

## 2020-12-17 DIAGNOSIS — G4733 Obstructive sleep apnea (adult) (pediatric): Secondary | ICD-10-CM

## 2020-12-17 NOTE — Patient Instructions (Signed)
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
## 2021-01-28 ENCOUNTER — Telehealth: Payer: Self-pay | Admitting: Neurology

## 2021-01-28 NOTE — Telephone Encounter (Signed)
Pt. Called to cancel her HST pick up appt due to her being diagnosed with covid this afternoon. Pt. Will call back to r/s

## 2021-03-26 ENCOUNTER — Encounter: Payer: Self-pay | Admitting: Orthopedic Surgery

## 2021-03-26 ENCOUNTER — Other Ambulatory Visit: Payer: Self-pay

## 2021-03-26 ENCOUNTER — Ambulatory Visit (INDEPENDENT_AMBULATORY_CARE_PROVIDER_SITE_OTHER): Payer: Managed Care, Other (non HMO) | Admitting: Orthopedic Surgery

## 2021-03-26 DIAGNOSIS — W5501XA Bitten by cat, initial encounter: Secondary | ICD-10-CM

## 2021-03-26 DIAGNOSIS — S61254A Open bite of right ring finger without damage to nail, initial encounter: Secondary | ICD-10-CM

## 2021-03-26 DIAGNOSIS — S61259A Open bite of unspecified finger without damage to nail, initial encounter: Secondary | ICD-10-CM
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
## 2021-12-08 ENCOUNTER — Ambulatory Visit: Payer: Self-pay

## 2021-12-08 ENCOUNTER — Ambulatory Visit (INDEPENDENT_AMBULATORY_CARE_PROVIDER_SITE_OTHER): Payer: Managed Care, Other (non HMO) | Admitting: Sports Medicine

## 2021-12-08 VITALS — BP 140/88 | Ht 67.0 in | Wt 244.0 lb

## 2021-12-08 DIAGNOSIS — M25571 Pain in right ankle and joints of right foot: Secondary | ICD-10-CM

## 2021-12-08 MED ORDER — IBUPROFEN 800 MG PO TABS: 800.0000 mg | ORAL_TABLET | Freq: Three times a day (TID) | ORAL | 0 refills | Status: DC | PRN

## 2021-12-08 NOTE — Progress Notes (Addendum)
   Subjective:    Patient ID: Anne Daniels, female    DOB: 27-Aug-1975, 46 y.o.   MRN: [id number]  HPI chief complaint: Right ankle pain  Gladies presents today complaining of medial sided right ankle pain that began acutely while walking across the floor 2 days ago.  She felt a twinge like a rubber band snapping along the medial ankle.  Immediate pain.  She has since developed some swelling and some mild bruising in this area.  She denies pain elsewhere in the ankle.  She has tried an Ace wrap which has been helpful.  She denies any similar issues in the past.    Review of Systems As above    Objective:   Physical Exam  Well-developed, well-nourished.  No acute distress  Right ankle: Good range of motion.  There is mild amount of swelling as well as tenderness to palpation along the course of the posterior tibialis tendon.  Slight ecchymosis.  Good strength but she does have reproducible pain when standing on her tiptoes on the right.  No pain with resisted great toe flexion.  Normal arch.  Achilles tendon is intact.  Good pulses.  Ambulating with an antalgic gait.  Limited MSK ultrasound of the right ankle: Limited views of the medial ankle show edema around the posterior tibialis tendon just proximal and posterior to the medial malleolus.  This is seen in both long and short axis view.  Majority of the tendon appears to be intact.  Findings are consistent with probable small tear of the posterior tibialis tendon.      Assessment & Plan:   Right ankle pain and swelling secondary to small posterior tibialis tendon tear  Body helix compression sleeve and crutches to help assist with ambulation.  She may wean from the crutches as tolerated.  Ibuprofen 800 mg twice daily as needed for pain.  She will take this with food.  She may also utilize icing.  She is given a note asking that her employer allow her to wear good supportive tennis shoes at work.  Once her symptoms have resolved, she  will schedule a follow-up appointment with me for a gait analysis.  She may need additional arch support in her shoes to prevent future recurrences.  This note was dictated using Dragon naturally speaking software and may contain errors in syntax, spelling, or content which have not been identified prior to signing this note.

## 2021-12-22 ENCOUNTER — Ambulatory Visit: Payer: Managed Care, Other (non HMO) | Admitting: Sports Medicine

## 2021-12-29 ENCOUNTER — Ambulatory Visit (INDEPENDENT_AMBULATORY_CARE_PROVIDER_SITE_OTHER): Payer: Managed Care, Other (non HMO) | Admitting: Sports Medicine

## 2021-12-29 VITALS — BP 148/86 | Ht 67.0 in | Wt 240.0 lb

## 2021-12-29 DIAGNOSIS — M25571 Pain in right ankle and joints of right foot: Secondary | ICD-10-CM | POA: Diagnosis not present

## 2021-12-29 MED ORDER — IBUPROFEN 800 MG PO TABS: 800.0000 mg | ORAL_TABLET | Freq: Three times a day (TID) | ORAL | 0 refills | Status: DC | PRN

## 2021-12-29 NOTE — Progress Notes (Signed)
   Subjective:    Patient ID: Anne Daniels, female    DOB: 1975/07/21, 46 y.o.   MRN: [id number]  HPI  Patient presents today for follow-up on right ankle pain and swelling secondary to a partial posterior tibialis tendon tear.  Overall, she feels like she is about 40% better.  She has weaned from her crutches but overdid it a few days ago by helping a friend move.  She is now getting more nighttime pain.  Ibuprofen does seem to help.  Compression sleeve also seems to be helpful.   Review of Systems As above    Objective:   Physical Exam  Well-developed, well-nourished.  No acute distress  Right ankle: There is still some slight amount of swelling along the medial ankle.  Some tenderness to palpation along the posterior tibialis tendon.  Tendon does appear to be functioning as there is normal calcaneal inversion when standing on her tiptoes.      Assessment & Plan:   Right ankle pain and swelling secondary to partial posterior tibialis tendon tear  I recommended a 2-week.  Of CAM Walker immobilization.  I will refill her ibuprofen to continue to take as needed as well.  Follow-up with me again in 2 weeks for reevaluation.  If symptoms are not improving then consider merits of further diagnostic imaging at that time.  This note was dictated using Dragon naturally speaking software and may contain errors in syntax, spelling, or content which have not been identified prior to signing this note.

## 2022-01-12 ENCOUNTER — Ambulatory Visit
Admission: RE | Admit: 2022-01-12 | Discharge: 2022-01-12 | Disposition: A | Discharge status: Home / Self Care | Payer: Managed Care, Other (non HMO) | Source: Ambulatory Visit | Attending: Sports Medicine | Admitting: Sports Medicine

## 2022-01-12 ENCOUNTER — Ambulatory Visit (INDEPENDENT_AMBULATORY_CARE_PROVIDER_SITE_OTHER): Payer: Managed Care, Other (non HMO) | Admitting: Sports Medicine

## 2022-01-12 VITALS — BP 124/80 | Ht 67.0 in | Wt 240.0 lb

## 2022-01-12 DIAGNOSIS — M25571 Pain in right ankle and joints of right foot: Secondary | ICD-10-CM

## 2022-01-12 NOTE — Progress Notes (Signed)
   Subjective:    Patient ID: Anne Daniels, female    DOB: February 18, 1975, 46 y.o.   MRN: [id number]  HPI  Patient presents today for follow-up on medial right ankle pain.  Pain is about the same.  She has found the walking boot to be comfortable.  Ibuprofen is helping with the swelling but not necessarily the pain.  Symptoms all began acutely when walking across a floor.  She continues to localize her pain primarily in the medial ankle but is beginning to get some lateral ankle pain secondary to compensation.    Review of Systems As above    Objective:   Physical Exam  Well-developed, well-nourished.  No acute distress  Right ankle: Full range of motion.  No effusion.  There is some mild persistent soft tissue swelling along the medial ankle with tenderness to palpation along the course of the posterior tibialis tendon distal to the medial malleolus and at the area of the navicular.  No tenderness laterally.  1+ talar tilt.  Good pulses.      Assessment & Plan:   Persistent medial ankle pain worrisome for posterior tibialis tendon tear  Previous ultrasound did suggest a tear of the posterior tibialis tendon.  Given her failure to improve with conservative treatment to date I feel that more information is needed.  We will start with an x-ray today of the right ankle and proceed with an MRI specifically to evaluate the posterior tibialis tendon further.  Phone follow-up with those results when available.  In the meantime, she will continue with her cam walker and we will provide her with a work note to give to her employer.  We will delineate further treatment based on MRI findings when available.  This note was dictated using Dragon naturally speaking software and may contain errors in syntax, spelling, or content which have not been identified prior to signing this note.   Addendum: X-rays reviewed.  There is some mild tarsal degenerative changes but nothing acute.  Proceed with MRI  as scheduled.

## 2022-01-24 ENCOUNTER — Other Ambulatory Visit: Payer: Managed Care, Other (non HMO)

## 2022-05-11 ENCOUNTER — Other Ambulatory Visit (HOSPITAL_COMMUNITY): Payer: Self-pay | Admitting: Internal Medicine

## 2022-05-11 DIAGNOSIS — N289 Disorder of kidney and ureter, unspecified: Secondary | ICD-10-CM

## 2022-05-20 ENCOUNTER — Ambulatory Visit (HOSPITAL_COMMUNITY)
Admission: RE | Admit: 2022-05-20 | Discharge: 2022-05-20 | Disposition: A | Discharge status: Home / Self Care | Payer: Managed Care, Other (non HMO) | Source: Ambulatory Visit | Attending: Internal Medicine | Admitting: Internal Medicine

## 2022-05-20 DIAGNOSIS — N289 Disorder of kidney and ureter, unspecified: Secondary | ICD-10-CM | POA: Diagnosis present

## 2022-05-27 ENCOUNTER — Telehealth: Payer: Self-pay

## 2022-05-27 MED ORDER — IBUPROFEN 800 MG PO TABS: 800.0000 mg | ORAL_TABLET | Freq: Three times a day (TID) | ORAL | 0 refills | Status: DC | PRN

## 2022-05-27 NOTE — Telephone Encounter (Signed)
-----   Message from Lizbeth Bark sent at 05/26/2022  3:18 PM EDT ----- Regarding: refill request Pt is asking for refill on ibuprofen (ADVIL) 800 MG tablet

## 2024-03-08 ENCOUNTER — Encounter (HOSPITAL_COMMUNITY): Payer: Self-pay

## 2024-03-08 ENCOUNTER — Ambulatory Visit (HOSPITAL_COMMUNITY)
Admission: EM | Admit: 2024-03-08 | Discharge: 2024-03-08 | Disposition: A | Discharge status: Home / Self Care | Attending: Emergency Medicine | Admitting: Emergency Medicine

## 2024-03-08 DIAGNOSIS — R112 Nausea with vomiting, unspecified: Secondary | ICD-10-CM | POA: Diagnosis not present

## 2024-03-08 DIAGNOSIS — E860 Dehydration: Secondary | ICD-10-CM

## 2024-03-08 LAB — POCT URINE DIPSTICK
Blood, UA: NEGATIVE
Glucose, UA: NEGATIVE mg/dL
Leukocytes, UA: NEGATIVE
Nitrite, UA: NEGATIVE
Spec Grav, UA: 1.02
Urobilinogen, UA: 1 U/dL
pH, UA: 5.5

## 2024-03-08 MED ORDER — METOCLOPRAMIDE HCL 5 MG/ML IJ SOLN
10.0000 mg | Freq: Once | INTRAMUSCULAR | Status: AC
  Administered 2024-03-08: 10 mg via INTRAVENOUS

## 2024-03-08 MED ORDER — METOCLOPRAMIDE HCL 5 MG/ML IJ SOLN
INTRAMUSCULAR | Status: AC
  Filled 2024-03-08: qty 2

## 2024-03-08 MED ORDER — SODIUM CHLORIDE 0.9 % IV BOLUS
1000.0000 mL | Freq: Once | INTRAVENOUS | Status: AC
  Administered 2024-03-08: 1000 mL via INTRAVENOUS

## 2024-03-08 NOTE — Discharge Instructions (Addendum)
 We have given you a liter of fluids with some nausea medication  Continue to rehydrate orally, small sips at a time Use your Zofran  every 8 hours as needed  Follow a bland diet to avoid further stomach upset   Return to clinic for new or urgent symptoms

## 2024-03-08 NOTE — ED Triage Notes (Addendum)
 Pt presents to the office for vomiting and nausea after taking her Ozempic injection 4 days ago.  Patient states he PCP called her in some Zofran . Took 8 mg this morning.
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
## 2024-03-14 ENCOUNTER — Other Ambulatory Visit (HOSPITAL_COMMUNITY): Payer: Self-pay | Admitting: Registered Nurse

## 2024-03-14 ENCOUNTER — Ambulatory Visit (HOSPITAL_COMMUNITY)
Admission: RE | Admit: 2024-03-14 | Discharge: 2024-03-14 | Disposition: A | Discharge status: Home / Self Care | Source: Ambulatory Visit | Attending: Registered Nurse | Admitting: Registered Nurse

## 2024-03-14 DIAGNOSIS — R112 Nausea with vomiting, unspecified: Secondary | ICD-10-CM | POA: Insufficient documentation

## 2024-03-14 DIAGNOSIS — R1011 Right upper quadrant pain: Secondary | ICD-10-CM | POA: Insufficient documentation

## 2024-03-20 ENCOUNTER — Other Ambulatory Visit: Payer: Self-pay

## 2024-03-20 ENCOUNTER — Observation Stay (HOSPITAL_COMMUNITY)
Admission: EM | Admit: 2024-03-20 | Discharge: 2024-03-23 | Disposition: A | Source: Home / Self Care | Attending: Emergency Medicine | Admitting: Emergency Medicine

## 2024-03-20 ENCOUNTER — Emergency Department (HOSPITAL_COMMUNITY)

## 2024-03-20 ENCOUNTER — Encounter (HOSPITAL_COMMUNITY): Payer: Self-pay

## 2024-03-20 DIAGNOSIS — K5289 Other specified noninfective gastroenteritis and colitis: Secondary | ICD-10-CM

## 2024-03-20 DIAGNOSIS — F419 Anxiety disorder, unspecified: Secondary | ICD-10-CM | POA: Insufficient documentation

## 2024-03-20 DIAGNOSIS — E119 Type 2 diabetes mellitus without complications: Secondary | ICD-10-CM

## 2024-03-20 DIAGNOSIS — F32A Depression, unspecified: Secondary | ICD-10-CM | POA: Insufficient documentation

## 2024-03-20 DIAGNOSIS — K5641 Fecal impaction: Principal | ICD-10-CM

## 2024-03-20 DIAGNOSIS — K5900 Constipation, unspecified: Secondary | ICD-10-CM | POA: Diagnosis present

## 2024-03-20 LAB — CBC WITH DIFFERENTIAL/PLATELET
Abs Immature Granulocytes: 0.04 10*3/uL (ref 0.00–0.07)
Basophils Absolute: 0.1 10*3/uL (ref 0.0–0.1)
Basophils Relative: 1 %
Eosinophils Absolute: 0.2 10*3/uL (ref 0.0–0.5)
Eosinophils Relative: 2 %
HCT: 47.1 % — ABNORMAL HIGH (ref 36.0–46.0)
Hemoglobin: 16.4 g/dL — ABNORMAL HIGH (ref 12.0–15.0)
Immature Granulocytes: 0 %
Lymphocytes Relative: 14 %
Lymphs Abs: 1.4 10*3/uL (ref 0.7–4.0)
MCH: 31.8 pg (ref 26.0–34.0)
MCHC: 34.8 g/dL (ref 30.0–36.0)
MCV: 91.5 fL (ref 80.0–100.0)
Monocytes Absolute: 0.6 10*3/uL (ref 0.1–1.0)
Monocytes Relative: 6 %
Neutro Abs: 7.9 10*3/uL — ABNORMAL HIGH (ref 1.7–7.7)
Neutrophils Relative %: 77 %
Platelets: 207 10*3/uL (ref 150–400)
RBC: 5.15 MIL/uL — ABNORMAL HIGH (ref 3.87–5.11)
RDW: 12.7 % (ref 11.5–15.5)
WBC: 10.1 10*3/uL (ref 4.0–10.5)
nRBC: 0 % (ref 0.0–0.2)

## 2024-03-20 LAB — COMPREHENSIVE METABOLIC PANEL WITH GFR
ALT: 14 U/L (ref 0–44)
AST: 22 U/L (ref 15–41)
Albumin: 4.3 g/dL (ref 3.5–5.0)
Alkaline Phosphatase: 75 U/L (ref 38–126)
Anion gap: 14 (ref 5–15)
BUN: 15 mg/dL (ref 6–20)
CO2: 23 mmol/L (ref 22–32)
Calcium: 9.4 mg/dL (ref 8.9–10.3)
Chloride: 101 mmol/L (ref 98–111)
Creatinine, Ser: 0.84 mg/dL (ref 0.44–1.00)
GFR, Estimated: >60 mL/min
Glucose, Bld: 142 mg/dL — ABNORMAL HIGH (ref 70–99)
Potassium: 3.7 mmol/L (ref 3.5–5.1)
Sodium: 138 mmol/L (ref 135–145)
Total Bilirubin: 1.2 mg/dL (ref 0.0–1.2)
Total Protein: 7.3 g/dL (ref 6.5–8.1)

## 2024-03-20 LAB — LIPASE, BLOOD: Lipase: 47 U/L (ref 11–51)

## 2024-03-20 MED ORDER — IOHEXOL 350 MG/ML SOLN
75.0000 mL | Freq: Once | INTRAVENOUS | Status: AC | PRN
  Administered 2024-03-20: 75 mL via INTRAVENOUS

## 2024-03-20 NOTE — ED Provider Triage Note (Signed)
 Emergency Medicine Provider Triage Evaluation Note  Anne Daniels , a 49 y.o. female  was evaluated in triage.  Pt complains of constipation.  Patient took her first dose of Ozempic on 1/19, she experienced 36 hours of nausea/vomiting following that, she has not had a bowel movement since 1/22 despite trying to pass a bowel movement at home.  Patient reports significant straining with attempts to pass a bowel movement, noted bright red blood in the toilet today after doing so.  Patient is not on a blood thinner.  Nausea/vomiting is largely resolved, although she did have 1 episode of vomiting earlier today.  Endorses generalized abdominal pain as well as rectal pain.  Review of Systems  Positive: As above Negative: As above  Physical Exam  BP (!) 158/113   Pulse (!) 111   Temp 97.8 F (36.6 C)   Resp 18   Ht 5' 7 (1.702 m)   Wt 107 kg   LMP 06/14/2018 Comment: bleed for 17 days  SpO2 99%   BMI 36.96 kg/m  Gen:   Awake, no distress   Resp:  Normal effort  MSK:   Moves extremities without difficulty  Other:  Abdomen is soft and without guarding/rigidity, generalized tenderness on exam  Medical Decision Making  Medically screening exam initiated at 7:04 PM.  Appropriate orders placed.  Akshaya S Lorenzi was informed that the remainder of the evaluation will be completed by another provider, this initial triage assessment does not replace that evaluation, and the importance of remaining in the ED until their evaluation is complete.     Glendia Rocky SAILOR, [place] 03/20/24 1906

## 2024-03-21 ENCOUNTER — Encounter (HOSPITAL_COMMUNITY): Payer: Self-pay | Admitting: Family Medicine

## 2024-03-21 DIAGNOSIS — F419 Anxiety disorder, unspecified: Secondary | ICD-10-CM | POA: Insufficient documentation

## 2024-03-21 DIAGNOSIS — K5900 Constipation, unspecified: Secondary | ICD-10-CM | POA: Diagnosis present

## 2024-03-21 DIAGNOSIS — E119 Type 2 diabetes mellitus without complications: Secondary | ICD-10-CM

## 2024-03-21 MED ORDER — ENOXAPARIN SODIUM 40 MG/0.4ML IJ SOSY
40.0000 mg | PREFILLED_SYRINGE | INTRAMUSCULAR | Status: DC
  Administered 2024-03-21 – 2024-03-22 (×2): 40 mg via SUBCUTANEOUS
  Filled 2024-03-21 (×2): qty 0.4

## 2024-03-21 MED ORDER — POLYETHYLENE GLYCOL 3350 17 G PO PACK
17.0000 g | PACK | Freq: Three times a day (TID) | ORAL | Status: DC
  Administered 2024-03-21 – 2024-03-23 (×6): 17 g via ORAL
  Filled 2024-03-21 (×6): qty 1

## 2024-03-21 MED ORDER — MORPHINE SULFATE (PF) 4 MG/ML IV SOLN
4.0000 mg | Freq: Once | INTRAVENOUS | Status: AC
  Administered 2024-03-21: 4 mg via INTRAVENOUS
  Filled 2024-03-21: qty 1

## 2024-03-21 MED ORDER — MAGNESIUM HYDROXIDE 400 MG/5ML PO SUSP
30.0000 mL | Freq: Once | ORAL | Status: AC
  Administered 2024-03-21: 30 mL via ORAL
  Filled 2024-03-21: qty 30

## 2024-03-21 MED ORDER — LOSARTAN POTASSIUM 50 MG PO TABS: 25.0000 mg | ORAL_TABLET | Freq: Every day | ORAL | Status: DC

## 2024-03-21 MED ORDER — ONDANSETRON HCL 4 MG/2ML IJ SOLN: 4.0000 mg | Freq: Four times a day (QID) | INTRAMUSCULAR | Status: DC | PRN

## 2024-03-21 MED ORDER — LACTULOSE ENEMA
300.0000 mL | Freq: Once | ORAL | Status: AC
  Administered 2024-03-21: 300 mL via RECTAL
  Filled 2024-03-21 (×2): qty 300

## 2024-03-21 MED ORDER — METOCLOPRAMIDE HCL 5 MG/ML IJ SOLN
10.0000 mg | Freq: Once | INTRAMUSCULAR | Status: AC
  Administered 2024-03-21: 10 mg via INTRAVENOUS
  Filled 2024-03-21: qty 2

## 2024-03-21 MED ORDER — LIDOCAINE HCL URETHRAL/MUCOSAL 2 % EX GEL
1.0000 | Freq: Once | CUTANEOUS | Status: AC
  Administered 2024-03-21: 1 via TOPICAL
  Filled 2024-03-21: qty 11

## 2024-03-21 MED ORDER — MAGNESIUM HYDROXIDE 400 MG/5ML PO SUSP
30.0000 mL | Freq: Every day | ORAL | Status: DC | PRN
  Administered 2024-03-22: 30 mL via ORAL
  Filled 2024-03-21: qty 30

## 2024-03-21 MED ORDER — SERTRALINE HCL 50 MG PO TABS: 150.0000 mg | ORAL_TABLET | Freq: Every day | ORAL | Status: DC

## 2024-03-21 MED ORDER — BUSPIRONE HCL 15 MG PO TABS: 15.0000 mg | ORAL_TABLET | Freq: Every day | ORAL | Status: DC | PRN

## 2024-03-21 MED ORDER — TRAZODONE 25 MG HALF TABLET
25.0000 mg | ORAL_TABLET | Freq: Every evening | ORAL | Status: DC | PRN
  Administered 2024-03-22: 25 mg via ORAL
  Filled 2024-03-21 (×2): qty 1

## 2024-03-21 MED ORDER — BISACODYL 10 MG RE SUPP
10.0000 mg | Freq: Once | RECTAL | Status: AC
  Administered 2024-03-21: 10 mg via RECTAL
  Filled 2024-03-21: qty 1

## 2024-03-21 MED ORDER — MORPHINE SULFATE (PF) 2 MG/ML IV SOLN
2.0000 mg | INTRAVENOUS | Status: DC | PRN
  Administered 2024-03-22 (×2): 2 mg via INTRAVENOUS
  Filled 2024-03-21 (×2): qty 1

## 2024-03-21 MED ORDER — SODIUM CHLORIDE 0.9 % IV SOLN: INTRAVENOUS | Status: DC

## 2024-03-21 MED ORDER — SODIUM CHLORIDE 0.9 % IV BOLUS
1000.0000 mL | Freq: Once | INTRAVENOUS | Status: AC
  Administered 2024-03-21: 1000 mL via INTRAVENOUS

## 2024-03-21 MED ORDER — LACTULOSE 10 GM/15ML PO SOLN
20.0000 g | Freq: Once | ORAL | Status: AC
  Administered 2024-03-21: 20 g via ORAL
  Filled 2024-03-21: qty 30

## 2024-03-21 MED ORDER — ONDANSETRON HCL 4 MG PO TABS: 4.0000 mg | ORAL_TABLET | Freq: Four times a day (QID) | ORAL | Status: DC | PRN

## 2024-03-21 MED ORDER — FLEET ENEMA RE ENEM: 1.0000 | ENEMA | Freq: Once | RECTAL | Status: DC | PRN

## 2024-03-21 MED ORDER — ACETAMINOPHEN 650 MG RE SUPP: 650.0000 mg | Freq: Four times a day (QID) | RECTAL | Status: DC | PRN

## 2024-03-21 MED ORDER — ACETAMINOPHEN 325 MG PO TABS
650.0000 mg | ORAL_TABLET | Freq: Four times a day (QID) | ORAL | Status: DC | PRN
  Administered 2024-03-21: 650 mg via ORAL
  Filled 2024-03-21: qty 2

## 2024-03-21 NOTE — Assessment & Plan Note (Signed)
-   Will resume BuSpar  and Zoloft .

## 2024-03-21 NOTE — Assessment & Plan Note (Signed)
-   The patient will be placed on supplemental coverage with NovoLog. - Will hold off metformin.

## 2024-03-21 NOTE — Progress Notes (Addendum)
 " PROGRESS NOTE  Anne Daniels  DOB: 02-04-1976  PCP: Stephane Leita DEL, MD FMW:[id number]  DOA: 03/20/2024  LOS: 0 days  Hospital Day: 2  Subjective: Patient was seen and examined this morning. Pleasant middle-aged Caucasian female.  Lying down in bed. Continues to have abdominal pain.  Has not had a bowel movement despite enema earlier today. Remains afebrile, hemodynamically stable.  Brief narrative: Anne Daniels is a 49 y.o. female with PMH significant for obesity, DM2, HTN, HLD, migraine, arthritis, GERD, Patient recently started on Ozempic injection couple weeks ago.  She developed profuse nausea and vomiting after her very first  dose of Ozempic.  She has been seen at the urgent care center twice for her symptoms and has required IV fluids, antiemetics.  She has also been dealing with poor tolerance to oral intake, hydration and significant constipation due to dehydration. Reported last BM 12 days prior on 1/22.  She tried stool softeners, also tried to disimpact herself but could not.  Started to develop right-sided abdominal pain and hence presented to ED on 2/3.  In the ED, afebrile, heart rate 100s, blood pressure 150s, breathing on room air Labs with WC count 10.1, hemoglobin 16.4, CMP unremarkable Ultrasound abdomen showed fatty liver CT abdomen pelvis did not show evidence of bowel obstruction but showed moderate to large rectal stool burden with rectal distention and mild perirectal inflammatory stranding, consistent with fecal impaction with stercoral proctitis.  In the ED, patient was given IV fluid, IV morphine , 1 dose of oral lactulose . Apparently 2 attempts were made for fecal disimpaction but did not succeed. Admitted to [facility]  Assessment and plan: Severe constipation Right-sided abdominal pain Constipation secondary to dehydration since being on Ozempic.  Reported last BM 12 days prior to presentation on 1/22 Imaging without bowel obstruction but moderate to  large rectal stool burden Failed disimpaction attempts twice in the ED. Also given 1 dose of Fleet enema earlier this morning. 1 dose of lactulose  enema was tried earlier.  I have ordered for a dose of Dulcolax suppository, milk of magnesia and MiraLAX  3 times daily  Continue as needed pain management for now  Morbid Obesity  Body mass index is 36.96 kg/m. Patient has been advised to make an attempt to improve diet and exercise patterns to aid in weight loss. Unwilling to continue Ozempic anymore  Type 2 diabetes mellitus Patient states her diabetes is well-controlled with diet.  Reports that her last A1c with PCP was 5.1 few months ago.  Anxiety/depression PTA meds- Cymbalta 60 mg nightly, BuSpar  50 mg daily PRN, trazodone  25 mg nightly    Nutrition Status:         Mobility: Encourage ambulation  PT Orders:   PT Follow up Rec:     Goals of care   Code Status: Full Code     DVT prophylaxis:  enoxaparin  (LOVENOX ) injection 40 mg Start: 03/21/24 1600   Antimicrobials: None Fluid: NS at 100 mL/h to continue Consultants: None Family Communication: None at bedside  Status: Observation Level of care:  Med-Surg   Patient is from: Home Needs to continue in-hospital care: Has not had a bowel movement Anticipated d/c to: Hopefully home in 1 to 2 days    Diet:  Diet Order             Diet clear liquid Room service appropriate? Yes; Fluid consistency: Thin  Diet effective now  Scheduled Meds:  enoxaparin  (LOVENOX ) injection  40 mg Subcutaneous Q24H   magnesium  hydroxide  30 mL Oral Once   polyethylene glycol  17 g Oral TID    PRN meds: acetaminophen  **OR** acetaminophen , busPIRone , magnesium  hydroxide, morphine  injection, ondansetron  **OR** ondansetron  (ZOFRAN ) IV, sodium phosphate , traZODone    Infusions:   sodium chloride  100 mL/hr at 03/21/24 0744    Antimicrobials: Anti-infectives (From admission, onward)    None        Objective: Vitals:   03/21/24 0956 03/21/24 1244  BP: (!) 145/77 (!) 139/90  Pulse: 85 85  Resp: 18 19  Temp: 98.1 F (36.7 C) 98 F (36.7 C)  SpO2: 99% 98%    Intake/Output Summary (Last 24 hours) at 03/21/2024 1344 Last data filed at 03/21/2024 0248 Gross per 24 hour  Intake 1000 ml  Output --  Net 1000 ml   Filed Weights   03/20/24 1840  Weight: 107 kg   Weight change:  Body mass index is 36.96 kg/m.   Physical Exam: General exam: Pleasant, middle-aged Caucasian female.  Skin: No rashes, lesions or ulcers. HEENT: Atraumatic, normocephalic, no obvious bleeding Lungs: Clear to auscultation bilaterally,  CVS: S1, S2, no murmur,   GI/Abd: Soft, mild diffuse abdominal tenderness, nondistended, bowel sound present,   CNS: Alert, awake, oriented x 3 Psychiatry: Mood appropriate Extremities: No pedal edema, no calf tenderness  Data Review: I have personally reviewed the laboratory data and studies available.  F/u labs ordered Unresulted Labs (From admission, onward)     Start     Ordered   03/22/24 0500  Basic metabolic panel  Tomorrow morning,   R        03/21/24 0625   03/22/24 0500  CBC  Tomorrow morning,   R        03/21/24 0625   03/22/24 0500  HIV Antibody (routine testing w rflx)  Tomorrow morning,   R        03/21/24 1343            Signed, Chapman Rota, MD [facility] 03/21/2024  "

## 2024-03-21 NOTE — ED Provider Notes (Signed)
 " [place] EMERGENCY DEPARTMENT AT [facility] Provider Note   CSN: [id number] Arrival date & time: 03/20/24  8164     Patient presents with: Constipation   Anne Daniels is a 49 y.o. female.   The history is provided by the patient and medical records.  Constipation Anne Daniels is a 49 y.o. female who presents to the Emergency Department complaining of abdominal pain.  She presents to the emergency department for evaluation of right-sided abdominal pain that has been ongoing for the last couple days.  On January 19 she took her first dose of Ozempic and several hours later developed profuse nausea and vomiting.  She has been seen in urgent care twice for symptoms and has been treated with antiemetic as well as IV fluids.  She has had very little oral intake over the last 2 weeks.  Last BM was on January 22.  When she developed increased abdominal cramping yesterday she started taking a stool softener without any relief.  Today she did try manual disimpaction and was unsuccessful. No associated fever, difficulty breathing.  She does have a history of syncope, no additional medical history.     Prior to Admission medications  Medication Sig Start Date End Date Taking? Authorizing Provider  acetaminophen  (TYLENOL ) 500 MG tablet Take 1,000 mg by mouth every 8 (eight) hours as needed (pain).    [provider]  aspirin -acetaminophen -caffeine (EXCEDRIN MIGRAINE) 250-250-65 MG tablet Take 2 tablets by mouth every 6 (six) hours as needed for headache.    [provider]  busPIRone  (BUSPAR ) 15 MG tablet Take 15 mg by mouth daily as needed.    [provider]  Cranberry 500 MG CAPS Take 500 mg by mouth daily.    [provider]  diphenhydrAMINE  (BENADRYL ) 25 MG tablet Take 1 tablet (25 mg total) by mouth every 6 (six) hours. 03/29/18   Nivia Colon, PA-C  DULoxetine (CYMBALTA) 60 MG capsule Take 60 mg by mouth daily. 03/20/24   [provider]  ibuprofen  (ADVIL ) 800 MG tablet Take 1 tablet (800 mg total) by mouth every 8 (eight) hours as needed. 05/27/22   Arvell Evalene SAUNDERS, DO  metoCLOPramide  (REGLAN ) 5 MG tablet Take 5 mg by mouth 2 (two) times daily. 03/13/24   [provider]  omeprazole  (PRILOSEC) 20 MG capsule Take 20 mg by mouth daily. 03/12/24   [provider]  ondansetron  (ZOFRAN ) 8 MG tablet Take 8 mg by mouth every 8 (eight) hours as needed. 03/06/24   [provider]  sertraline  (ZOLOFT ) 100 MG tablet Take 150 mg by mouth daily.    [provider]  UBRELVY 50 MG TABS Take 50 mg by mouth as needed (Migraine). 12/28/23   [provider]    Allergies: Penicillins, Progesterone, Other, and Ozempic (0.25 or 0.5 mg-dose) [semaglutide(0.25 or 0.5mg -dos)]    Review of Systems  Gastrointestinal:  Positive for constipation.  All other systems reviewed and are negative.   Updated Vital Signs BP 120/78   Pulse 80   Temp 98.2 F (36.8 C) (Oral)   Resp 18   Ht 5' 7 (1.702 m)   Wt 107 kg   LMP 06/14/2018 Comment: bleed for 17 days  SpO2 98%   BMI 36.96 kg/m   Physical Exam Vitals and nursing note reviewed.  Constitutional:      Appearance: She is well-developed.  HENT:     Head: Normocephalic and atraumatic.  Cardiovascular:     Rate and  Rhythm: Normal rate and regular rhythm.  Pulmonary:     Effort: Pulmonary effort is normal.     Breath sounds: Normal breath sounds.  Abdominal:     Palpations: Abdomen is soft.     Tenderness: There is no guarding or rebound.     Comments: Mild generalized abdominal tenderness  Genitourinary:    Comments: Firm brown stool in rectal vault.  There is mild tenderness on DRE, no perianal masses. Musculoskeletal:        General: No tenderness.  Skin:    General: Skin is warm and dry.  Neurological:     Mental Status: She is alert and oriented to person, place, and time.  Psychiatric:        Behavior: Behavior normal.     (all  labs ordered are listed, but only abnormal results are displayed) Labs Reviewed  COMPREHENSIVE METABOLIC PANEL WITH GFR - Abnormal; Notable for the following components:      Result Value   Glucose, Bld 142 (*)    All other components within normal limits  CBC WITH DIFFERENTIAL/PLATELET - Abnormal; Notable for the following components:   RBC 5.15 (*)    Hemoglobin 16.4 (*)    HCT 47.1 (*)    Neutro Abs 7.9 (*)    All other components within normal limits  LIPASE, BLOOD  HIV ANTIBODY (ROUTINE TESTING W REFLEX)    EKG: None  Radiology: CT ABDOMEN PELVIS W CONTRAST Result Date: 03/20/2024 EXAM: CT ABDOMEN AND PELVIS WITH CONTRAST 03/20/2024 10:29:00 PM TECHNIQUE: CT of the abdomen and pelvis was performed with the administration of 75 mL of iohexol  (OMNIPAQUE ) 350 MG/ML injection. Multiplanar reformatted images are provided for review. Automated exposure control, iterative reconstruction, and/or weight-based adjustment of the mA/kV was utilized to reduce the radiation dose to as low as reasonably achievable. COMPARISON: None available. CLINICAL HISTORY: last BM 1/22, concern for SBO vs fecal impaction. Last bowel movement 1/22; concern for small bowel obstruction versus fecal impaction. FINDINGS: LOWER CHEST: No acute abnormality. LIVER: There is a suggestion of nodular liver contour. GALLBLADDER AND BILE DUCTS: Gallbladder is unremarkable. No biliary ductal dilatation. SPLEEN: No acute abnormality. PANCREAS: No acute abnormality. ADRENAL GLANDS: No acute abnormality. KIDNEYS, URETERS AND BLADDER: No stones in the kidneys or ureters. No hydronephrosis. No perinephric or periureteral stranding. Urinary bladder is unremarkable. GI AND BOWEL: Stomach demonstrates no acute abnormality. There are a few scattered colonic diverticula. The appendix appears within normal limits. There is a moderate to large amount of stool in the rectum. The rectum measures 6.6 cm and there is mild surrounding inflammatory  stranding. There is no bowel obstruction. PERITONEUM AND RETROPERITONEUM: No ascites. No free air. VASCULATURE: Aorta is normal in caliber. LYMPH NODES: No lymphadenopathy. REPRODUCTIVE ORGANS: The uterus is surgically absent. BONES AND SOFT TISSUES: No acute osseous abnormality. No focal soft tissue abnormality. IMPRESSION: 1. Moderate to large rectal stool burden with rectal distention and mild perirectal inflammatory stranding, consistent with fecal impaction with stercoral proctitis. 2. No evidence of small bowel obstruction. Electronically signed by: Greig Pique MD 03/20/2024 10:40 PM EST RP Workstation: [id number]     .Fecal disimpaction  Date/Time: 03/21/2024 7:15 AM  Performed by: Griselda Norris, MD Authorized by: Griselda Norris, MD  Consent: Verbal consent obtained Consent given by: patient Patient identity confirmed: verbally with patient Local anesthesia used: let.  Anesthesia: Local anesthesia used: let.  Sedation: Patient sedated: no  Patient tolerance: patient tolerated the procedure well with no immediate complications  Medications Ordered in the ED  losartan  (COZAAR ) tablet 25 mg (has no administration in time range)  busPIRone  (BUSPAR ) tablet 15 mg (has no administration in time range)  sertraline  (ZOLOFT ) tablet 150 mg (has no administration in time range)  enoxaparin  (LOVENOX ) injection 40 mg (has no administration in time range)  0.9 %  sodium chloride  infusion (has no administration in time range)  acetaminophen  (TYLENOL ) tablet 650 mg (has no administration in time range)    Or  acetaminophen  (TYLENOL ) suppository 650 mg (has no administration in time range)  traZODone  (DESYREL ) tablet 25 mg (has no administration in time range)  magnesium  hydroxide (MILK OF MAGNESIA) suspension 30 mL (has no administration in time range)  ondansetron  (ZOFRAN ) tablet 4 mg (has no administration in time range)    Or  ondansetron  (ZOFRAN ) injection 4 mg (has no  administration in time range)  sodium phosphate  (FLEET) enema 1 enema (has no administration in time range)  iohexol  (OMNIPAQUE ) 350 MG/ML injection 75 mL (75 mLs Intravenous Contrast Given 03/20/24 2225)  morphine  (PF) 4 MG/ML injection 4 mg (4 mg Intravenous Given 03/21/24 0059)  metoCLOPramide  (REGLAN ) injection 10 mg (10 mg Intravenous Given 03/21/24 0100)  lidocaine  (XYLOCAINE ) 2 % jelly 1 Application (1 Application Topical Given 03/21/24 0100)  sodium chloride  0.9 % bolus 1,000 mL (0 mLs Intravenous Stopped 03/21/24 0248)  lactulose  (CHRONULAC ) 10 GM/15ML solution 20 g (20 g Oral Given 03/21/24 0309)  morphine  (PF) 4 MG/ML injection 4 mg (4 mg Intravenous Given 03/21/24 0342)                                    Medical Decision Making Risk Prescription drug management. Decision regarding hospitalization.   Patient here for evaluation of abdominal pain, rectal pain after starting Ozempic and having resultant constipation.  CT scan demonstrates fecal impaction with stercoral colitis.  She was treated with IV fluids, pain medications.  Bedside disimpaction was attempted twice, without significant removal of stool.  She was also treated with an enema with no significant stool output.  Given patient's ongoing symptoms plan to admit for ongoing care.  Medicine consulted for admission.     Final diagnoses:  Fecal impaction [facility])  Stercoral colitis    ED Discharge Orders     None          Griselda Norris, MD 03/21/24 [phone]  "

## 2024-03-21 NOTE — Assessment & Plan Note (Addendum)
-   This is associated with sterile coral colitis. - She will be admitted to a medical-surgical observation bed. - Will place her on clear liquids and will hydrate with IV normal saline. - Aggressive constipation management will be provided. - Disimpaction can be attempted later on.

## 2024-03-21 NOTE — Progress Notes (Signed)
 Pt was able to have a bowel movement today around 2015. Stated bowel movement was the size of a goose egg and continuing to pass gas.
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
## 2024-03-21 NOTE — Plan of Care (Signed)
   Problem: Education: Goal: Knowledge of General Education information will improve Description: Including pain rating scale, medication(s)/side effects and non-pharmacologic comfort measures Outcome: Completed/Met

## 2024-03-22 LAB — CBC
HCT: 37.7 % (ref 36.0–46.0)
Hemoglobin: 13.2 g/dL (ref 12.0–15.0)
MCH: 32.1 pg (ref 26.0–34.0)
MCHC: 35 g/dL (ref 30.0–36.0)
MCV: 91.7 fL (ref 80.0–100.0)
Platelets: 151 10*3/uL (ref 150–400)
RBC: 4.11 MIL/uL (ref 3.87–5.11)
RDW: 12.6 % (ref 11.5–15.5)
WBC: 5.8 10*3/uL (ref 4.0–10.5)
nRBC: 0 % (ref 0.0–0.2)

## 2024-03-22 LAB — BASIC METABOLIC PANEL WITH GFR
Anion gap: 9 (ref 5–15)
BUN: <5 mg/dL — ABNORMAL LOW (ref 6–20)
CO2: 23 mmol/L (ref 22–32)
Calcium: 8.1 mg/dL — ABNORMAL LOW (ref 8.9–10.3)
Chloride: 107 mmol/L (ref 98–111)
Creatinine, Ser: 0.64 mg/dL (ref 0.44–1.00)
GFR, Estimated: >60 mL/min
Glucose, Bld: 106 mg/dL — ABNORMAL HIGH (ref 70–99)
Potassium: 3.8 mmol/L (ref 3.5–5.1)
Sodium: 139 mmol/L (ref 135–145)

## 2024-03-22 LAB — HIV ANTIBODY (ROUTINE TESTING W REFLEX): HIV Screen 4th Generation wRfx: NONREACTIVE

## 2024-03-22 MED ORDER — BISACODYL 10 MG RE SUPP
10.0000 mg | Freq: Once | RECTAL | Status: AC
  Administered 2024-03-22: 10 mg via RECTAL
  Filled 2024-03-22: qty 1

## 2024-03-22 MED ORDER — MAGNESIUM HYDROXIDE 400 MG/5ML PO SUSP: 30.0000 mL | Freq: Once | ORAL | Status: AC

## 2024-03-22 NOTE — Progress Notes (Signed)
 " PROGRESS NOTE  Anne Daniels  DOB: 16-Apr-1975  PCP: Stephane Leita DEL, MD FMW:[id number]  DOA: 03/20/2024  LOS: 0 days  Hospital Day: 3  Subjective: Patient was seen and examined this morning. Lying on bed. Had a small hard bowel movement last night after Milk of Magnesia and Dulcolax was given. Afebrile, hemodynamically stable, breathing on room air. Repeat labs this morning unremarkable  Brief narrative: Anne Daniels is a 49 y.o. female with PMH significant for obesity, DM2, HTN, HLD, migraine, arthritis, GERD, Patient recently started on Ozempic injection couple weeks ago.  She developed profuse nausea and vomiting after her very first  dose of Ozempic.  She has been seen at the urgent care center twice for her symptoms and has required IV fluids, antiemetics.  She has also been dealing with poor tolerance to oral intake, hydration and significant constipation due to dehydration. Reported last BM 12 days prior on 1/22.  She tried stool softeners, also tried to disimpact herself but could not.  Started to develop right-sided abdominal pain and hence presented to ED on 2/3.  In the ED, afebrile, heart rate 100s, blood pressure 150s, breathing on room air Labs with WC count 10.1, hemoglobin 16.4, CMP unremarkable Ultrasound abdomen showed fatty liver CT abdomen pelvis did not show evidence of bowel obstruction but showed moderate to large rectal stool burden with rectal distention and mild perirectal inflammatory stranding, consistent with fecal impaction with stercoral proctitis.  In the ED, patient was given IV fluid, IV morphine , 1 dose of oral lactulose . Apparently 2 attempts were made for fecal disimpaction but did not succeed. Admitted to [facility]  Assessment and plan: Severe constipation Right-sided abdominal pain Constipation secondary to dehydration since being on Ozempic.  Reported last BM 12 days prior to presentation on 1/22 Imaging without bowel obstruction but moderate  to large rectal stool burden Failed disimpaction attempts twice in the ED. Had a small hard bowel movement last night after Milk of Magnesia and Dulcolax was given. Will repeat milk of magnesia and Dulcolax today as well.  Also keep on MiraLAX  daily.  Morbid Obesity  Body mass index is 36.96 kg/m. Patient has been advised to make an attempt to improve diet and exercise patterns to aid in weight loss. Unwilling to continue Ozempic anymore  Type 2 diabetes mellitus Patient states her diabetes is well-controlled with diet.  Reports that her last A1c with PCP was 5.1 few months ago.  Anxiety/depression PTA meds- Cymbalta 60 mg nightly, BuSpar  50 mg daily PRN, trazodone  25 mg nightly   Mobility: Encourage ambulation  PT Orders:   PT Follow up Rec:     Goals of care   Code Status: Full Code     DVT prophylaxis:  enoxaparin  (LOVENOX ) injection 40 mg Start: 03/21/24 1600   Antimicrobials: None Fluid: Can stop fluid today Consultants: None Family Communication: None at bedside  Status: Observation Level of care:  Med-Surg   Patient is from: Home Needs to continue in-hospital care: Still needs to have good bowel movement Anticipated d/c to: Hopefully home in 1 to 2 days    Diet:  Diet Order             Diet clear liquid Room service appropriate? Yes; Fluid consistency: Thin  Diet effective now                   Scheduled Meds:  bisacodyl   10 mg Rectal Once   enoxaparin  (LOVENOX ) injection  40 mg Subcutaneous  Q24H   magnesium  hydroxide  30 mL Oral Once   polyethylene glycol  17 g Oral TID    PRN meds: acetaminophen  **OR** acetaminophen , busPIRone , morphine  injection, ondansetron  **OR** ondansetron  (ZOFRAN ) IV, sodium phosphate , traZODone    Infusions:     Antimicrobials: Anti-infectives (From admission, onward)    None       Objective: Vitals:   03/22/24 0306 03/22/24 0822  BP: 133/76 125/73  Pulse: 73 74  Resp: 18 17  Temp: 97.8 F (36.6 C)  98 F (36.7 C)  SpO2: 97% 98%    Intake/Output Summary (Last 24 hours) at 03/22/2024 1236 Last data filed at 03/22/2024 0149 Gross per 24 hour  Intake 180 ml  Output --  Net 180 ml   Filed Weights   03/20/24 1840  Weight: 107 kg   Weight change:  Body mass index is 36.96 kg/m.   Physical Exam: General exam: Pleasant, middle-aged Caucasian female.  Skin: No rashes, lesions or ulcers. HEENT: Atraumatic, normocephalic, no obvious bleeding Lungs: Clear to auscultation bilaterally,  CVS: S1, S2, no murmur,   GI/Abd: Soft, continues to have mild diffuse abdominal distention and tenderness, bowel sound present,   CNS: Alert, awake, oriented x 3 Psychiatry: Mood appropriate Extremities: No pedal edema, no calf tenderness  Data Review: I have personally reviewed the laboratory data and studies available.  F/u labs ordered Unresulted Labs (From admission, onward)    None       Signed, Chapman Rota, MD [facility] 03/22/2024  "

## 2024-03-23 ENCOUNTER — Other Ambulatory Visit (HOSPITAL_COMMUNITY): Payer: Self-pay

## 2024-03-23 MED ORDER — DOCUSATE SODIUM 100 MG PO CAPS
100.0000 mg | ORAL_CAPSULE | Freq: Two times a day (BID) | ORAL | 0 refills | Status: AC
  Filled 2024-03-23: qty 60, 30d supply, fill #0

## 2024-03-23 MED ORDER — BISACODYL 10 MG RE SUPP: 10.0000 mg | Freq: Once | RECTAL | Status: DC

## 2024-03-23 MED ORDER — MAGNESIUM HYDROXIDE 400 MG/5ML PO SUSP: 30.0000 mL | Freq: Once | ORAL | Status: DC

## 2024-03-23 MED ORDER — POLYETHYLENE GLYCOL 3350 17 GM/SCOOP PO POWD
17.0000 g | Freq: Every day | ORAL | 0 refills | Status: AC | PRN
  Filled 2024-03-23: qty 238, 14d supply, fill #0

## 2024-03-23 NOTE — Discharge Summary (Signed)
 "  Physician Discharge Summary  Anne Daniels FMW:[id number] DOB: Apr 12, 1975 DOA: 03/20/2024  PCP: Stephane Leita DEL, MD  Admit date: 03/20/2024 Discharge date: 03/23/2024  Admitted from: Home Discharge disposition: Home  Recommendations at discharge:  Recommend regular bowel regimen with scheduled docusate and as needed MiraLAX .  Subjective: Patient was seen and examined this morning. Lying on bed.  Feels much better after a big bowel movement last night.  Abdominal pain has improved.  Diet advanced this morning.  Able to tolerate.  Wants to go home today.  Brief narrative: Anne Daniels is a 49 y.o. female with PMH significant for obesity, DM2, HTN, HLD, migraine, arthritis, GERD, Patient recently started on Ozempic injection couple weeks ago.  She developed profuse nausea and vomiting after her very first  dose of Ozempic.  She has been seen at the urgent care center twice for her symptoms and has required IV fluids, antiemetics.  She has also been dealing with poor tolerance to oral intake, hydration and significant constipation due to dehydration. Reported last BM 12 days prior on 1/22.  She tried stool softeners, also tried to disimpact herself but could not.  Started to develop right-sided abdominal pain and hence presented to ED on 2/3.  In the ED, afebrile, heart rate 100s, blood pressure 150s, breathing on room air Labs with WC count 10.1, hemoglobin 16.4, CMP unremarkable Ultrasound abdomen showed fatty liver CT abdomen pelvis did not show evidence of bowel obstruction but showed moderate to large rectal stool burden with rectal distention and mild perirectal inflammatory stranding, consistent with fecal impaction with stercoral proctitis.  In the ED, patient was given IV fluid, IV morphine , 1 dose of oral lactulose . Apparently 2 attempts were made for fecal disimpaction but did not succeed. Admitted to [facility]  Hospital course: Severe constipation Right-sided abdominal  pain Constipation secondary to dehydration since being on Ozempic.  Reported last BM 12 days prior to presentation on 1/22 Imaging without bowel obstruction but moderate to large rectal stool burden Failed disimpaction attempts twice in the ED. She was kept in the hospital and started on aggressive bowel regimen with milk of magnesia, Dulcolax, MiraLAX .  After about 48 hours of the regimen, patient had a big bowel movement.  Abdominal pain has improved.  I believe she still has a lot of stool but she does not need inpatient care anymore.  I have advised her to start scheduled Colace and as needed MiraLAX  at home.  Prescriptions sent to pharmacy.  Morbid Obesity  Body mass index is 36.96 kg/m. Patient has been advised to make an attempt to improve diet and exercise patterns to aid in weight loss. Given her bad experience with nausea vomiting abdominal pain, patient is unwilling to continue Ozempic anymore  Type 2 diabetes mellitus Patient states her diabetes is well-controlled with diet.   Reports that her last A1c with PCP was 5.1 few months ago.  Anxiety/depression PTA meds- Cymbalta 60 mg nightly, BuSpar  50 mg daily PRN, trazodone  25 mg nightly   Mobility: Encourage ambulation.  Independently able to ambulate.  Goals of care   Code Status: Full Code   Diet:  Diet Order             Diet regular Room service appropriate? Yes; Fluid consistency: Thin  Diet effective now           Diet general                   Nutritional status:  Body mass index is 36.96 kg/m.       Wounds:  -    Discharge Medications:   Allergies as of 03/23/2024       Reactions   Penicillins Hives, Swelling   Progesterone Swelling, Other (See Comments)   Hives, tongue swelling, high fever, itchy    Other Other (See Comments)   Black olives cause migraines   Ozempic (0.25 Or 0.5 Mg-dose) [semaglutide(0.25 Or 0.5mg -dos)] Nausea And Vomiting        Medication List     TAKE these  medications    acetaminophen  500 MG tablet Commonly known as: TYLENOL  Take 1,000 mg by mouth every 8 (eight) hours as needed (pain).   aspirin -acetaminophen -caffeine 250-250-65 MG tablet Commonly known as: EXCEDRIN MIGRAINE Take 2 tablets by mouth every 6 (six) hours as needed for headache.   busPIRone  15 MG tablet Commonly known as: BUSPAR  Take 15 mg by mouth daily as needed (Anxiety).   Cranberry 500 MG Caps Take 500 mg by mouth daily.   diphenhydrAMINE  25 MG tablet Commonly known as: BENADRYL  Take 1 tablet (25 mg total) by mouth every 6 (six) hours.   docusate sodium  100 MG capsule Commonly known as: Colace Take 1 capsule (100 mg total) by mouth 2 (two) times daily.   DULoxetine 60 MG capsule Commonly known as: CYMBALTA Take 60 mg by mouth at bedtime.   metoCLOPramide  5 MG tablet Commonly known as: REGLAN  Take 5 mg by mouth 2 (two) times daily as needed for nausea or vomiting.   omeprazole  20 MG capsule Commonly known as: PRILOSEC Take 20 mg by mouth daily.   ondansetron  8 MG tablet Commonly known as: ZOFRAN  Take 8 mg by mouth every 8 (eight) hours as needed for nausea or vomiting.   polyethylene glycol powder 17 GM/SCOOP powder Commonly known as: GLYCOLAX /MIRALAX  Take 17 g by mouth daily as needed. Dissolve 1 capful (17g) in 4-8 ounces of liquid and take by mouth daily.   Ubrelvy 50 MG Tabs Generic drug: Ubrogepant Take 50 mg by mouth as needed (Migraine).         Follow ups:    Follow-up Information     Stephane Leita DEL, MD Follow up.   Specialty: Internal Medicine Contact information: [address] [place] [address] [phone]                 Discharge Instructions:   Discharge Instructions     Call MD for:  difficulty breathing, headache or visual disturbances   Complete by: As directed    Call MD for:  extreme fatigue   Complete by: As directed    Call MD for:  hives   Complete by: As directed    Call MD for:  persistant  dizziness or light-headedness   Complete by: As directed    Call MD for:  persistant nausea and vomiting   Complete by: As directed    Call MD for:  severe uncontrolled pain   Complete by: As directed    Call MD for:  temperature >100.4   Complete by: As directed    Diet general   Complete by: As directed    Discharge instructions   Complete by: As directed    Recommendations at discharge:   Recommend regular bowel regimen with scheduled docusate and as needed MiraLAX .  General discharge instructions: Follow with Primary MD Stephane Leita DEL, MD in 7 days  Please request your PCP  to go over your hospital tests, procedures, radiology results  at the follow up. Please get your medicines reviewed and adjusted.  Your PCP may decide to repeat certain labs or tests as needed. Do not drive, operate heavy machinery, perform activities at heights, swimming or participation in water activities or provide baby sitting services if your were admitted for syncope or siezures until you have seen by Primary MD or a Neurologist and advised to do so again. [place]  Controlled Substance Reporting System database was reviewed. Do not drive, operate heavy machinery, perform activities at heights, swim, participate in water activities or provide baby-sitting services while on medications for pain, sleep and mood until your outpatient physician has reevaluated you and advised to do so again.  You are strongly recommended to comply with the dose, frequency and duration of prescribed medications. Activity: As tolerated with Full fall precautions use walker/cane & assistance as needed Avoid using any recreational substances like cigarette, tobacco, alcohol, or non-prescribed drug. If you experience worsening of your admission symptoms, develop shortness of breath, life threatening emergency, suicidal or homicidal thoughts you must seek medical attention immediately by calling 911 or calling your MD immediately  if  symptoms less severe. You must read complete instructions/literature along with all the possible adverse reactions/side effects for all the medicines you take and that have been prescribed to you. Take any new medicine only after you have completely understood and accepted all the possible adverse reactions/side effects.  Wear Seat belts while driving. You were cared for by a hospitalist during your hospital stay. If you have any questions about your discharge medications or the care you received while you were in the hospital after you are discharged, you can call the unit and ask to speak with the hospitalist or the covering physician. Once you are discharged, your primary care physician will handle any further medical issues. Please note that NO REFILLS for any discharge medications will be authorized once you are discharged, as it is imperative that you return to your primary care physician (or establish a relationship with a primary care physician if you do not have one).   Increase activity slowly   Complete by: As directed        Discharge Exam:   Vitals:   03/22/24 1259 03/22/24 1913 03/22/24 2341 03/23/24 0806  BP: (!) 144/87 139/78 137/85 114/74  Pulse: 89 76 86 74  Resp: 18 17 18 17   Temp: 98.6 F (37 C) 98.2 F (36.8 C) 98.3 F (36.8 C) 97.8 F (36.6 C)  TempSrc: Oral Oral Oral Oral  SpO2: 98% 100% 100% 96%  Weight:      Height:        Body mass index is 36.96 kg/m.   General exam: Pleasant, middle-aged Caucasian female.  Skin: No rashes, lesions or ulcers. HEENT: Atraumatic, normocephalic, no obvious bleeding Lungs: Clear to auscultation bilaterally,  CVS: S1, S2, no murmur,   GI/Abd: Soft, nontender, nondistended, bowel sound present CNS: Alert, awake, oriented x 3 Psychiatry: Mood appropriate Extremities: No pedal edema, no calf tenderness   The results of significant diagnostics from this hospitalization (including imaging, microbiology, ancillary and  laboratory) are listed below for reference.    Procedures and Diagnostic Studies:   CT ABDOMEN PELVIS W CONTRAST Result Date: 03/20/2024 EXAM: CT ABDOMEN AND PELVIS WITH CONTRAST 03/20/2024 10:29:00 PM TECHNIQUE: CT of the abdomen and pelvis was performed with the administration of 75 mL of iohexol  (OMNIPAQUE ) 350 MG/ML injection. Multiplanar reformatted images are provided for review. Automated exposure control, iterative reconstruction, and/or weight-based  adjustment of the mA/kV was utilized to reduce the radiation dose to as low as reasonably achievable. COMPARISON: None available. CLINICAL HISTORY: last BM 1/22, concern for SBO vs fecal impaction. Last bowel movement 1/22; concern for small bowel obstruction versus fecal impaction. FINDINGS: LOWER CHEST: No acute abnormality. LIVER: There is a suggestion of nodular liver contour. GALLBLADDER AND BILE DUCTS: Gallbladder is unremarkable. No biliary ductal dilatation. SPLEEN: No acute abnormality. PANCREAS: No acute abnormality. ADRENAL GLANDS: No acute abnormality. KIDNEYS, URETERS AND BLADDER: No stones in the kidneys or ureters. No hydronephrosis. No perinephric or periureteral stranding. Urinary bladder is unremarkable. GI AND BOWEL: Stomach demonstrates no acute abnormality. There are a few scattered colonic diverticula. The appendix appears within normal limits. There is a moderate to large amount of stool in the rectum. The rectum measures 6.6 cm and there is mild surrounding inflammatory stranding. There is no bowel obstruction. PERITONEUM AND RETROPERITONEUM: No ascites. No free air. VASCULATURE: Aorta is normal in caliber. LYMPH NODES: No lymphadenopathy. REPRODUCTIVE ORGANS: The uterus is surgically absent. BONES AND SOFT TISSUES: No acute osseous abnormality. No focal soft tissue abnormality. IMPRESSION: 1. Moderate to large rectal stool burden with rectal distention and mild perirectal inflammatory stranding, consistent with fecal impaction with  stercoral proctitis. 2. No evidence of small bowel obstruction. Electronically signed by: Greig Pique MD 03/20/2024 10:40 PM EST RP Workstation: [id number]     Labs:   Basic Metabolic Panel: Recent Labs  Lab 03/20/24 1916 03/22/24 0430  NA 138 139  K 3.7 3.8  CL 101 107  CO2 23 23  GLUCOSE 142* 106*  BUN 15 <5*  CREATININE 0.84 0.64  CALCIUM 9.4 8.1*   GFR Estimated Creatinine Clearance: 108.3 mL/min (by C-G formula based on SCr of 0.64 mg/dL). Liver Function Tests: Recent Labs  Lab 03/20/24 1916  AST 22  ALT 14  ALKPHOS 75  BILITOT 1.2  PROT 7.3  ALBUMIN  4.3   Recent Labs  Lab 03/20/24 1916  LIPASE 47   No results for input(s): AMMONIA in the last 168 hours. Coagulation profile No results for input(s): INR, PROTIME in the last 168 hours.  CBC: Recent Labs  Lab 03/20/24 1916 03/22/24 0430  WBC 10.1 5.8  NEUTROABS 7.9*  --   HGB 16.4* 13.2  HCT 47.1* 37.7  MCV 91.5 91.7  PLT 207 151   Cardiac Enzymes: No results for input(s): CKTOTAL, CKMB, CKMBINDEX, TROPONINI in the last 168 hours. BNP: Invalid input(s): POCBNP CBG: No results for input(s): GLUCAP in the last 168 hours. D-Dimer No results for input(s): DDIMER in the last 72 hours. Hgb A1c No results for input(s): HGBA1C in the last 72 hours. Lipid Profile No results for input(s): CHOL, HDL, LDLCALC, TRIG, CHOLHDL, LDLDIRECT in the last 72 hours. Thyroid function studies No results for input(s): TSH, T4TOTAL, T3FREE, THYROIDAB in the last 72 hours.  Invalid input(s): FREET3 Anemia work up No results for input(s): VITAMINB12, FOLATE, FERRITIN, TIBC, IRON, RETICCTPCT in the last 72 hours. Microbiology No results found for this or any previous visit (from the past 240 hours).  Time coordinating discharge: 45 minutes  Signed: Zamiya Dillard  [facility] 03/23/2024, 11:18 AM  "
